# Patient Record
Sex: Female | Born: 1935
Health system: Southern US, Community
[De-identification: ages and names within clinical notes are randomized; demographics above are authoritative.]

## PROBLEM LIST (undated history)

## (undated) DIAGNOSIS — F329 Major depressive disorder, single episode, unspecified: Secondary | ICD-10-CM

## (undated) DIAGNOSIS — F419 Anxiety disorder, unspecified: Secondary | ICD-10-CM

## (undated) DIAGNOSIS — K219 Gastro-esophageal reflux disease without esophagitis: Secondary | ICD-10-CM

## (undated) DIAGNOSIS — J309 Allergic rhinitis, unspecified: Secondary | ICD-10-CM

## (undated) DIAGNOSIS — R7303 Prediabetes: Secondary | ICD-10-CM

## (undated) DIAGNOSIS — Z972 Presence of dental prosthetic device (complete) (partial): Secondary | ICD-10-CM

## (undated) DIAGNOSIS — E785 Hyperlipidemia, unspecified: Secondary | ICD-10-CM

## (undated) DIAGNOSIS — I4719 Other supraventricular tachycardia: Secondary | ICD-10-CM

## (undated) DIAGNOSIS — K08109 Complete loss of teeth, unspecified cause, unspecified class: Secondary | ICD-10-CM

## (undated) DIAGNOSIS — Z862 Personal history of diseases of the blood and blood-forming organs and certain disorders involving the immune mechanism: Secondary | ICD-10-CM

## (undated) DIAGNOSIS — I471 Supraventricular tachycardia: Secondary | ICD-10-CM

## (undated) DIAGNOSIS — C679 Malignant neoplasm of bladder, unspecified: Secondary | ICD-10-CM

## (undated) DIAGNOSIS — M1990 Unspecified osteoarthritis, unspecified site: Secondary | ICD-10-CM

## (undated) DIAGNOSIS — I2510 Atherosclerotic heart disease of native coronary artery without angina pectoris: Secondary | ICD-10-CM

## (undated) DIAGNOSIS — I10 Essential (primary) hypertension: Secondary | ICD-10-CM

## (undated) DIAGNOSIS — F32A Depression, unspecified: Secondary | ICD-10-CM

## (undated) DIAGNOSIS — Z973 Presence of spectacles and contact lenses: Secondary | ICD-10-CM

## (undated) HISTORY — DX: Depression, unspecified: F32.A

## (undated) HISTORY — DX: Essential (primary) hypertension: I10

## (undated) HISTORY — DX: Major depressive disorder, single episode, unspecified: F32.9

## (undated) HISTORY — DX: Anxiety disorder, unspecified: F41.9

## (undated) HISTORY — DX: Atherosclerotic heart disease of native coronary artery without angina pectoris: I25.10

## (undated) HISTORY — DX: Hyperlipidemia, unspecified: E78.5

## (undated) HISTORY — PX: CARDIOVASCULAR STRESS TEST: SHX262

## (undated) HISTORY — PX: ABDOMINAL HYSTERECTOMY: SHX81

## (undated) HISTORY — PX: CARDIAC CATHETERIZATION: SHX172

## (undated) HISTORY — DX: Allergic rhinitis, unspecified: J30.9

---

## 1968-12-26 HISTORY — PX: ABDOMINAL HYSTERECTOMY: SHX81

## 2005-02-04 ENCOUNTER — Other Ambulatory Visit: Admission: RE | Admit: 2005-02-04 | Discharge: 2005-02-17 | Payer: Self-pay | Admitting: Obstetrics and Gynecology

## 2005-03-10 ENCOUNTER — Ambulatory Visit: Payer: Self-pay | Admitting: Internal Medicine

## 2006-05-20 ENCOUNTER — Ambulatory Visit: Payer: Self-pay | Admitting: Internal Medicine

## 2007-05-27 ENCOUNTER — Ambulatory Visit: Payer: Self-pay | Admitting: Internal Medicine

## 2007-05-27 DIAGNOSIS — F411 Generalized anxiety disorder: Secondary | ICD-10-CM | POA: Insufficient documentation

## 2007-05-27 DIAGNOSIS — R5383 Other fatigue: Secondary | ICD-10-CM

## 2007-05-27 DIAGNOSIS — F32A Depression, unspecified: Secondary | ICD-10-CM | POA: Insufficient documentation

## 2007-05-27 DIAGNOSIS — F329 Major depressive disorder, single episode, unspecified: Secondary | ICD-10-CM

## 2007-05-27 DIAGNOSIS — R5381 Other malaise: Secondary | ICD-10-CM | POA: Insufficient documentation

## 2007-05-27 DIAGNOSIS — I10 Essential (primary) hypertension: Secondary | ICD-10-CM | POA: Insufficient documentation

## 2007-05-27 DIAGNOSIS — E785 Hyperlipidemia, unspecified: Secondary | ICD-10-CM | POA: Insufficient documentation

## 2007-05-30 LAB — CONVERTED CEMR LAB
ALT: 15 U/L
AST: 17 U/L
Albumin: 4 g/dL
Alkaline Phosphatase: 74 U/L
BUN: 19 mg/dL
Basophils Absolute: 0 10*3/uL
Basophils Relative: 0.6 %
Bilirubin, Direct: 0.1 mg/dL
CO2: 29 meq/L
Calcium: 9.9 mg/dL
Chloride: 105 meq/L
Cholesterol: 239 mg/dL
Creatinine, Ser: 1.1 mg/dL
Direct LDL: 147.3 mg/dL
Eosinophils Absolute: 0.1 10*3/uL
Eosinophils Relative: 1.5 %
GFR calc Af Amer: 63 mL/min
GFR calc non Af Amer: 52 mL/min
Glucose, Bld: 96 mg/dL
HCT: 34.3 % — ABNORMAL LOW
HDL: 64.6 mg/dL
Hemoglobin: 11.2 g/dL — ABNORMAL LOW
Lymphocytes Relative: 26 %
MCHC: 32.6 g/dL
MCV: 91.6 fL
Monocytes Absolute: 0.6 10*3/uL
Monocytes Relative: 9.8 %
Neutro Abs: 4.2 10*3/uL
Neutrophils Relative %: 62.1 %
Platelets: 227 10*3/uL
Potassium: 4.9 meq/L
RBC: 3.75 M/uL — ABNORMAL LOW
RDW: 12.2 %
Sodium: 140 meq/L
TSH: 2.17 u[IU]/mL
Total Bilirubin: 0.6 mg/dL
Total CHOL/HDL Ratio: 3.7
Total Protein: 6.9 g/dL
Triglycerides: 98 mg/dL
VLDL: 20 mg/dL
WBC: 6.6 10*3/uL

## 2008-01-16 ENCOUNTER — Telehealth (INDEPENDENT_AMBULATORY_CARE_PROVIDER_SITE_OTHER): Payer: Self-pay | Admitting: *Deleted

## 2008-06-19 ENCOUNTER — Ambulatory Visit: Payer: Self-pay | Admitting: Internal Medicine

## 2008-06-19 DIAGNOSIS — D509 Iron deficiency anemia, unspecified: Secondary | ICD-10-CM | POA: Insufficient documentation

## 2008-06-20 LAB — CONVERTED CEMR LAB
AST: 16 units/L (ref 0–37)
Albumin: 3.7 g/dL (ref 3.5–5.2)
BUN: 22 mg/dL (ref 6–23)
Basophils Absolute: 0.1 10*3/uL (ref 0.0–0.1)
Calcium: 9.7 mg/dL (ref 8.4–10.5)
Cholesterol: 280 mg/dL (ref 0–200)
Creatinine, Ser: 0.9 mg/dL (ref 0.4–1.2)
Direct LDL: 180.6 mg/dL
Eosinophils Absolute: 0.1 10*3/uL (ref 0.0–0.7)
GFR calc Af Amer: 79 mL/min
GFR calc non Af Amer: 65 mL/min
HCT: 36 % (ref 36.0–46.0)
Hemoglobin: 12.3 g/dL (ref 12.0–15.0)
Iron: 80 ug/dL (ref 42–145)
MCHC: 34.2 g/dL (ref 30.0–36.0)
Monocytes Absolute: 0.6 10*3/uL (ref 0.1–1.0)
Neutro Abs: 4.1 10*3/uL (ref 1.4–7.7)
RDW: 12.1 % (ref 11.5–14.6)
Saturation Ratios: 24.8 % (ref 20.0–50.0)
Triglycerides: 179 mg/dL — ABNORMAL HIGH (ref 0–149)
VLDL: 36 mg/dL (ref 0–40)
Vitamin B-12: 1119 pg/mL — ABNORMAL HIGH (ref 211–911)

## 2008-08-15 ENCOUNTER — Encounter: Payer: Self-pay | Admitting: Cardiovascular Disease

## 2008-08-16 ENCOUNTER — Ambulatory Visit: Payer: Self-pay | Admitting: Vascular Surgery

## 2008-08-22 ENCOUNTER — Inpatient Hospital Stay (HOSPITAL_BASED_OUTPATIENT_CLINIC_OR_DEPARTMENT_OTHER): Admission: RE | Admit: 2008-08-22 | Discharge: 2008-08-22 | Payer: Self-pay | Admitting: Cardiovascular Disease

## 2008-09-10 ENCOUNTER — Encounter: Payer: Self-pay | Admitting: Internal Medicine

## 2008-10-24 ENCOUNTER — Ambulatory Visit: Payer: Self-pay | Admitting: Internal Medicine

## 2008-10-24 DIAGNOSIS — I2510 Atherosclerotic heart disease of native coronary artery without angina pectoris: Secondary | ICD-10-CM | POA: Insufficient documentation

## 2008-10-24 DIAGNOSIS — R42 Dizziness and giddiness: Secondary | ICD-10-CM | POA: Insufficient documentation

## 2008-10-24 LAB — CONVERTED CEMR LAB
AST: 18 units/L (ref 0–37)
BUN: 19 mg/dL (ref 6–23)
Basophils Relative: 0.4 % (ref 0.0–3.0)
Cholesterol: 147 mg/dL (ref 0–200)
Eosinophils Relative: 1.3 % (ref 0.0–5.0)
GFR calc non Af Amer: 51.59 mL/min (ref >60)
HCT: 34.5 % — ABNORMAL LOW (ref 36.0–46.0)
Iron: 74 ug/dL (ref 42–145)
LDL Cholesterol: 84 mg/dL (ref 0–99)
Lymphs Abs: 1.5 10*3/uL (ref 0.7–4.0)
MCV: 90.1 fL (ref 78.0–100.0)
Monocytes Absolute: 0.4 10*3/uL (ref 0.1–1.0)
Neutro Abs: 3.4 10*3/uL (ref 1.4–7.7)
Platelets: 243 10*3/uL (ref 150.0–400.0)
Potassium: 4.2 meq/L (ref 3.5–5.1)
RBC: 3.83 M/uL — ABNORMAL LOW (ref 3.87–5.11)
Total Bilirubin: 0.7 mg/dL (ref 0.3–1.2)
Transferrin: 199.4 mg/dL — ABNORMAL LOW (ref 212.0–360.0)
VLDL: 14.4 mg/dL (ref 0.0–40.0)
WBC: 5.4 10*3/uL (ref 4.5–10.5)

## 2008-11-05 ENCOUNTER — Telehealth: Payer: Self-pay | Admitting: Internal Medicine

## 2008-11-23 ENCOUNTER — Ambulatory Visit: Payer: Self-pay | Admitting: Internal Medicine

## 2008-11-23 DIAGNOSIS — I951 Orthostatic hypotension: Secondary | ICD-10-CM | POA: Insufficient documentation

## 2008-11-23 LAB — CONVERTED CEMR LAB
GFR calc non Af Amer: 65.02 mL/min (ref >60)
Potassium: 3.1 meq/L — ABNORMAL LOW (ref 3.5–5.1)
Sodium: 143 meq/L (ref 135–145)

## 2008-12-21 ENCOUNTER — Telehealth: Payer: Self-pay | Admitting: Internal Medicine

## 2009-02-25 ENCOUNTER — Telehealth: Payer: Self-pay | Admitting: Internal Medicine

## 2009-02-26 ENCOUNTER — Telehealth (INDEPENDENT_AMBULATORY_CARE_PROVIDER_SITE_OTHER): Payer: Self-pay | Admitting: *Deleted

## 2009-02-27 ENCOUNTER — Encounter: Payer: Self-pay | Admitting: Internal Medicine

## 2009-06-10 ENCOUNTER — Ambulatory Visit: Payer: Self-pay | Admitting: Internal Medicine

## 2009-06-10 LAB — CONVERTED CEMR LAB
ALT: 22 units/L (ref 0–35)
AST: 21 units/L (ref 0–37)
Albumin: 3.8 g/dL (ref 3.5–5.2)
Alkaline Phosphatase: 86 units/L (ref 39–117)
BUN: 15 mg/dL (ref 6–23)
Basophils Absolute: 0 10*3/uL (ref 0.0–0.1)
Basophils Relative: 0.9 % (ref 0.0–3.0)
Bilirubin Urine: NEGATIVE
Bilirubin, Direct: 0.1 mg/dL (ref 0.0–0.3)
CO2: 30 meq/L (ref 19–32)
Calcium: 9.1 mg/dL (ref 8.4–10.5)
Chloride: 111 meq/L (ref 96–112)
Cholesterol: 162 mg/dL (ref 0–200)
Cortisol, Plasma: 10 ug/dL
Creatinine, Ser: 0.9 mg/dL (ref 0.4–1.2)
Eosinophils Absolute: 0 10*3/uL (ref 0.0–0.7)
Eosinophils Relative: 1.1 % (ref 0.0–5.0)
Folate: 8.4 ng/mL
GFR calc non Af Amer: 64.92 mL/min (ref >60)
Glucose, Bld: 105 mg/dL — ABNORMAL HIGH (ref 70–99)
HCT: 35.8 % — ABNORMAL LOW (ref 36.0–46.0)
HDL: 71 mg/dL (ref >39.00)
Hemoglobin: 11.8 g/dL — ABNORMAL LOW (ref 12.0–15.0)
Ketones, ur: NEGATIVE mg/dL
LDL Cholesterol: 79 mg/dL (ref 0–99)
Lymphocytes Relative: 37.1 % (ref 12.0–46.0)
Lymphs Abs: 1.7 10*3/uL (ref 0.7–4.0)
MCHC: 32.8 g/dL (ref 30.0–36.0)
MCV: 90.5 fL (ref 78.0–100.0)
Monocytes Absolute: 0.3 10*3/uL (ref 0.1–1.0)
Monocytes Relative: 7.6 % (ref 3.0–12.0)
Neutro Abs: 2.5 10*3/uL (ref 1.4–7.7)
Neutrophils Relative %: 53.3 % (ref 43.0–77.0)
Nitrite: NEGATIVE
Platelets: 186 10*3/uL (ref 150.0–400.0)
Potassium: 4.6 meq/L (ref 3.5–5.1)
RBC: 3.96 M/uL (ref 3.87–5.11)
RDW: 12.4 % (ref 11.5–14.6)
Saturation Ratios: 26 % (ref 20.0–50.0)
Sodium: 144 meq/L (ref 135–145)
Specific Gravity, Urine: >=1.03 (ref 1.000–1.030)
TSH: 1.4 microintl units/mL (ref 0.35–5.50)
Total Bilirubin: 0.3 mg/dL (ref 0.3–1.2)
Total CHOL/HDL Ratio: 2
Total Protein, Urine: NEGATIVE mg/dL
Total Protein: 6.7 g/dL (ref 6.0–8.3)
Transferrin: 236.1 mg/dL (ref 212.0–360.0)
Triglycerides: 62 mg/dL (ref 0.0–149.0)
Urine Glucose: NEGATIVE mg/dL
Urobilinogen, UA: 0.2 (ref 0.0–1.0)
VLDL: 12.4 mg/dL (ref 0.0–40.0)
Vitamin B-12: 524 pg/mL (ref 211–911)
WBC: 4.5 10*3/uL (ref 4.5–10.5)
pH: 5 (ref 5.0–8.0)

## 2009-06-13 ENCOUNTER — Ambulatory Visit: Payer: Self-pay | Admitting: Internal Medicine

## 2009-09-27 ENCOUNTER — Telehealth (INDEPENDENT_AMBULATORY_CARE_PROVIDER_SITE_OTHER): Payer: Self-pay | Admitting: *Deleted

## 2010-05-27 NOTE — Progress Notes (Signed)
----   Converted from flag ---- ---- 09/27/2009 3:03 PM, Corwin Levins MD wrote: ok to change the micardis to losartan 100 mg - to robin to handle  ---- 09/27/2009 2:36 PM, [name] wrote: called pt to inform of above information. Pt would like to change to Cozaar IF you think it will work for her.  ---- 09/27/2009 2:22 PM, Corwin Levins MD wrote: we only take requests from the patient regarding med change, thanks  ---- 09/27/2009 1:18 PM, [name] wrote:  ok  ---- 09/27/2009 12:06 PM, Rhonda Norman wrote: Rhonda Norman Rhonda Norman -pharmacist called.   Rhonda Norman/Aug 22, [id number] cannot afford Mycardis ($145), would like to switch to generic for Cozaar which is Losartan, is this okay? Pls let [name] @[facility]  know, thx.  [name] ------------------------------

## 2010-05-27 NOTE — Assessment & Plan Note (Signed)
Summary: 6 MO ROV /NWS #   Vital Signs:  Patient profile:   77 year old female Height:      67 inches Weight:      149.75 pounds BMI:     23.54 O2 Sat:      97 % on Room air Temp:     98.1 degrees F oral Pulse rate:   61 / minute BP sitting:   112 / 58  (left arm) Cuff size:   regular  Vitals Entered ByZella Ball Ewing (June 13, 2009 1:40 PM)  O2 Flow:  Room air  Preventive Care Screening     dec;lines immunizations and colonscopy  CC: 6 Mo ROV/RE   CC:  6 Mo ROV/RE.  History of Present Illness: doing well without the fludrocort for over 2 months without further dizziness;  Pt denies CP, sob, doe, wheezing, orthopnea, pnd, worsening LE edema, palps, dizziness or syncope  Pt denies new neuro symptoms such as headache, facial or extremity weakness   Here for wellness Diet: Heart Healthy or DM if diabetic Physical Activities: Sedentary, better in the summer - walks in the summer Depression/mood screen: Negative Hearing: Intact bilateral Visual Acuity: Grossly normal ADL's: Capable, lives by herself, just got a new puppy  Fall Risk: None Home Safety: Good End-of-Life Planning: Advance directive - Full code/I agree; has living will  Problems Prior to Update: 1)  Orthostatic Hypotension  (ICD-458.0) 2)  Dizziness  (ICD-780.4) 3)  Coronary Artery Disease  (ICD-414.00) 4)  Anemia-iron Deficiency  (ICD-280.9) 5)  Hyperlipidemia  (ICD-272.4) 6)  Fatigue  (ICD-780.79) 7)  Family History of Alcoholism/addiction  (ICD-V61.41) 8)  Depression  (ICD-311) 9)  Anxiety  (ICD-300.00) 10)  Hypertension  (ICD-401.9)  Medications Prior to Update: 1)  Micardis 80 Mg Tabs (Telmisartan) .Marland Kitchen.. 1po Once Daily 2)  Ecotrin Low Strength 81 Mg  Tbec (Aspirin) .Marland Kitchen.. 1 By Mouth Qd 3)  Fluoxetine Hcl 20 Mg Tabs (Fluoxetine Hcl) .Marland Kitchen.. 1 By Mouth Once Daily 4)  Simvastatin 80 Mg Tabs (Simvastatin) .Marland Kitchen.. 1 By Mouth Once Daily 5)  Fludrocortisone Acetate 0.1 Mg Tabs (Fludrocortisone Acetate) .Marland Kitchen.. 1  By Mouth Once Daily 6)  Omeprazole 20 Mg Cpdr (Omeprazole) .Marland Kitchen.. 1po Once Daily 7)  Amlodipine Besylate 5 Mg Tabs (Amlodipine Besylate) .Marland Kitchen.. 1 By Mouth Once Daily  Current Medications (verified): 1)  Micardis 80 Mg Tabs (Telmisartan) .Marland Kitchen.. 1po Once Daily 2)  Ecotrin Low Strength 81 Mg  Tbec (Aspirin) .Marland Kitchen.. 1 By Mouth Qd 3)  Fluoxetine Hcl 20 Mg Tabs (Fluoxetine Hcl) .Marland Kitchen.. 1 By Mouth Once Daily 4)  Simvastatin 80 Mg Tabs (Simvastatin) .Marland Kitchen.. 1 By Mouth Once Daily 5)  Omeprazole 20 Mg Cpdr (Omeprazole) .Marland Kitchen.. 1po Once Daily 6)  Amlodipine Besylate 5 Mg Tabs (Amlodipine Besylate) .Marland Kitchen.. 1 By Mouth Once Daily  Allergies (verified): 1)  ! Pcn 2)  ! 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         all otherwise negative per pt -  Physical Exam  General:  alert and well-developed.   Head:  normocephalic and atraumatic.   Eyes:  vision grossly intact, pupils equal, and pupils round.   Ears:  R ear normal and  L ear normal.   Nose:  no external deformity and no nasal discharge.   Mouth:  no gingival abnormalities and pharynx pink and moist.   Neck:  supple and no masses.   Lungs:  normal respiratory effort and normal breath sounds.   Heart:  normal rate and regular rhythm.   Abdomen:  soft, non-tender, and normal bowel sounds.   Msk:  no joint tenderness and no joint swelling.   Extremities:  no edema, no erythema  Neurologic:  cranial nerves II-XII intact, strength normal in all extremities, and DTRs symmetrical and normal.   Psych:  not anxious appearing and not depressed appearing.     Impression & Recommendations:  Problem # 1:  Preventive Health Care (ICD-V70.0)  Overall doing well, age appropriate education and counseling updated and referral for appropriate preventive services done unless declined, immunizations up to date or declined, diet counseling done if overweight, urged to quit smoking if smokes , most recent labs reviewed and current ordered if appropriate, ecg reviewed or declined (interpretation per ECG scanned in the EMR if done); information regarding Medicare Prevention requirements given if appropriate   Orders: First annual wellness visit with prevention plan  (N5621)  Problem # 2:  DIZZINESS (ICD-780.4) resolved, ok to cont off the fludrocortisone  Problem # 3:  HYPERTENSION (ICD-401.9)  Her updated medication list for this problem includes:    Micardis 80 Mg Tabs (Telmisartan) .Marland Kitchen... 1po once daily    Amlodipine Besylate 5 Mg Tabs (Amlodipine besylate) .Marland Kitchen... 1 by mouth once daily  BP today: 112/58 Prior BP: 132/78 (11/23/2008)  Labs Reviewed: K+: 4.6 (06/10/2009) Creat: : 0.9 (06/10/2009)   Chol: 162 (06/10/2009)   HDL: 71.00 (06/10/2009)   LDL: 79 (06/10/2009)   TG: 62.0 (06/10/2009) stable overall by hx and exam, ok to continue meds/tx as is   Problem # 4:  HYPERLIPIDEMIA (ICD-272.4)  Her updated medication list for this problem includes:    Simvastatin 80  Mg Tabs (Simvastatin) .Marland Kitchen... 1 by mouth once daily  Labs Reviewed: SGOT: 21 (06/10/2009)   SGPT: 22 (06/10/2009)   HDL:71.00 (06/10/2009), 48.70 (10/24/2008)  LDL:79 (06/10/2009), 84 (10/24/2008)  Chol:162 (06/10/2009), 147 (10/24/2008)  Trig:62.0 (06/10/2009), 72.0 (10/24/2008) stable overall by hx and exam, ok to continue meds/tx as is, Pt to continue diet efforts, good med tolerance;  - goal LDL less than 100  Complete Medication List: 1)  Micardis 80 Mg Tabs (Telmisartan) .Marland Kitchen.. 1po once daily 2)  Ecotrin Low Strength 81 Mg Tbec (Aspirin) .Marland Kitchen.. 1 by mouth qd 3)  Fluoxetine Hcl 20 Mg Tabs (Fluoxetine hcl) .Marland Kitchen.. 1 by mouth once daily 4)  Simvastatin 80 Mg Tabs (Simvastatin) .Marland Kitchen.. 1 by mouth once daily 5)  Omeprazole 20 Mg Cpdr (Omeprazole) .Marland Kitchen.. 1po once daily 6)  Amlodipine Besylate 5 Mg Tabs (Amlodipine besylate) .Marland Kitchen.. 1 by mouth once daily  Patient Instructions: 1)  Continue all previous medications as before this visit  2)  Please schedule a follow-up appointment in 1 year or sooner if needed Prescriptions: AMLODIPINE BESYLATE 5 MG TABS (AMLODIPINE BESYLATE) 1  by mouth once daily  #90 x 3   Entered and Authorized by:   Corwin Levins MD   Signed by:   Corwin Levins MD on 06/13/2009   Method used:   Print then Give to Patient   RxID:   [id number] OMEPRAZOLE 20 MG CPDR (OMEPRAZOLE) 1po once daily  #90 x 3   Entered and Authorized by:   Corwin Levins MD   Signed by:   Corwin Levins MD on 06/13/2009   Method used:   Print then Give to Patient   RxID:   [id number] SIMVASTATIN 80 MG TABS (SIMVASTATIN) 1 by mouth once daily  #90 x 3   Entered and Authorized by:   Corwin Levins MD   Signed by:   Corwin Levins MD on 06/13/2009   Method used:   Print then Give to Patient   RxID:   [id number] FLUOXETINE HCL 20 MG TABS (FLUOXETINE HCL) 1 by mouth once daily  #90 x 3   Entered and Authorized by:   Corwin Levins MD   Signed by:   Corwin Levins MD on 06/13/2009   Method used:   Print  then Give to Patient   RxID:   [id number] MICARDIS 80 MG TABS (TELMISARTAN) 1po once daily  #90 x 3   Entered and Authorized by:   Corwin Levins MD   Signed by:   Corwin Levins MD on 06/13/2009   Method used:   Print then Give to Patient   RxID:   [id number]

## 2010-06-30 ENCOUNTER — Other Ambulatory Visit: Payer: Medicare Other

## 2010-06-30 ENCOUNTER — Other Ambulatory Visit: Payer: Self-pay | Admitting: Internal Medicine

## 2010-06-30 ENCOUNTER — Ambulatory Visit (INDEPENDENT_AMBULATORY_CARE_PROVIDER_SITE_OTHER): Payer: Medicare Other | Admitting: Internal Medicine

## 2010-06-30 ENCOUNTER — Encounter: Payer: Self-pay | Admitting: Internal Medicine

## 2010-06-30 DIAGNOSIS — I10 Essential (primary) hypertension: Secondary | ICD-10-CM

## 2010-06-30 DIAGNOSIS — R5381 Other malaise: Secondary | ICD-10-CM

## 2010-06-30 DIAGNOSIS — F329 Major depressive disorder, single episode, unspecified: Secondary | ICD-10-CM

## 2010-06-30 DIAGNOSIS — E785 Hyperlipidemia, unspecified: Secondary | ICD-10-CM

## 2010-06-30 DIAGNOSIS — K219 Gastro-esophageal reflux disease without esophagitis: Secondary | ICD-10-CM | POA: Insufficient documentation

## 2010-06-30 DIAGNOSIS — R5383 Other fatigue: Secondary | ICD-10-CM

## 2010-06-30 DIAGNOSIS — I2510 Atherosclerotic heart disease of native coronary artery without angina pectoris: Secondary | ICD-10-CM

## 2010-06-30 LAB — BASIC METABOLIC PANEL
Calcium: 9.4 mg/dL (ref 8.4–10.5)
GFR: 60.09 mL/min (ref >60.00)
Glucose, Bld: 103 mg/dL — ABNORMAL HIGH (ref 70–99)
Potassium: 4.7 mEq/L (ref 3.5–5.1)
Sodium: 138 mEq/L (ref 135–145)

## 2010-06-30 LAB — CBC WITH DIFFERENTIAL/PLATELET
Basophils Absolute: 0.1 10*3/uL (ref 0.0–0.1)
Eosinophils Relative: 2.1 % (ref 0.0–5.0)
HCT: 36.9 % (ref 36.0–46.0)
Hemoglobin: 12.5 g/dL (ref 12.0–15.0)
Lymphocytes Relative: 32.7 % (ref 12.0–46.0)
Lymphs Abs: 1.7 10*3/uL (ref 0.7–4.0)
Monocytes Relative: 7.8 % (ref 3.0–12.0)
Neutro Abs: 2.9 10*3/uL (ref 1.4–7.7)
Platelets: 184 10*3/uL (ref 150.0–400.0)
RDW: 13.5 % (ref 11.5–14.6)
WBC: 5.1 10*3/uL (ref 4.5–10.5)

## 2010-06-30 LAB — HEPATIC FUNCTION PANEL
ALT: 21 U/L (ref 0–35)
AST: 22 U/L (ref 0–37)
Alkaline Phosphatase: 79 U/L (ref 39–117)
Total Bilirubin: 0.5 mg/dL (ref 0.3–1.2)

## 2010-06-30 LAB — LIPID PANEL
Cholesterol: 156 mg/dL (ref 0–200)
HDL: 62.9 mg/dL (ref >39.00)
LDL Cholesterol: 77 mg/dL (ref 0–99)
Total CHOL/HDL Ratio: 2
Triglycerides: 79 mg/dL (ref 0.0–149.0)
VLDL: 15.8 mg/dL (ref 0.0–40.0)

## 2010-07-08 ENCOUNTER — Telehealth (INDEPENDENT_AMBULATORY_CARE_PROVIDER_SITE_OTHER): Payer: Self-pay | Admitting: *Deleted

## 2010-07-08 NOTE — Assessment & Plan Note (Signed)
Summary: fu/lb   Vital Signs:  Patient profile:   77 year old female Height:      66 inches Weight:      175 pounds BMI:     28.35 O2 Sat:      99 % on Room air Temp:     98.1 degrees F oral Pulse rate:   68 / minute BP sitting:   140 / 86  (left arm) Cuff size:   regular  Vitals Entered By: [name] CMA (AAMA) (June 30, 2010 8:03 AM)  O2 Flow:  Room air  Preventive Care Screening     decliens colonoscopy, tetansu and pneumonia shot  CC: Followup/RE   CC:  Followup/RE.  History of Present Illness: here to f/u - overall doing ok, Pt denies CP, worsening sob, doe, wheezing, orthopnea, pnd, worsening LE edema, palps, dizziness or syncope  Pt denies new neuro symptoms such as headache, facial or extremity weakness  Pt denies polydipsia, polyuria .  Overall good compliance with meds, trying to follow low chol diet, wt stable, little excercise however . and good med tolerability.  No fever, wt loss, night sweats, loss of appetite or other constitutional symptoms .  Not taking the prozac - doing ok without for now - Denies worsening depressive symptoms, suicidal ideation, or panic.  Does have significant fatigue, without OSA symptoms.   Problems Prior to Update: 1)  Gerd  (ICD-530.81) 2)  Preventive Health Care  (ICD-V70.0) 3)  Orthostatic Hypotension  (ICD-458.0) 4)  Dizziness  (ICD-780.4) 5)  Coronary Artery Disease  (ICD-414.00) 6)  Anemia-iron Deficiency  (ICD-280.9) 7)  Hyperlipidemia  (ICD-272.4) 8)  Fatigue  (ICD-780.79) 9)  Family History of Alcoholism/addiction  (ICD-V61.41) 10)  Depression  (ICD-311) 11)  Anxiety  (ICD-300.00) 12)  Hypertension  (ICD-401.9)  Medications Prior to Update: 1)  Micardis 80 Mg Tabs (Telmisartan) .Marland Kitchen.. 1po Once Daily 2)  Ecotrin Low Strength 81 Mg  Tbec (Aspirin) .Marland Kitchen.. 1 By Mouth Qd 3)  Fluoxetine Hcl 20 Mg Tabs (Fluoxetine Hcl) .Marland Kitchen.. 1 By Mouth Once Daily 4)  Simvastatin 80 Mg Tabs (Simvastatin) .Marland Kitchen.. 1 By Mouth Once Daily 5)   Omeprazole 20 Mg Cpdr (Omeprazole) .Marland Kitchen.. 1po Once Daily 6)  Amlodipine Besylate 5 Mg Tabs (Amlodipine Besylate) .Marland Kitchen.. 1 By Mouth Once Daily 7)  Losartan Potassium 100 Mg Tabs (Losartan Potassium) .Marland Kitchen.. 1 By Mouth Once Daily  Current Medications (verified): 1)  Ecotrin Low Strength 81 Mg  Tbec (Aspirin) .Marland Kitchen.. 1 By Mouth Qd 2)  Atorvastatin Calcium 40 Mg Tabs (Atorvastatin Calcium) .Marland Kitchen.. 1po Once Daily 3)  Pantoprazole Sodium 40 Mg Tbec (Pantoprazole Sodium) .Marland Kitchen.. 1po Once Daily 4)  Twynsta 80-5 Mg Tabs (Telmisartan-Amlodipine) .Marland Kitchen.. 1po Once Daily  Allergies (verified): 1)  ! Pcn 2)  ! 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   Head:  normocephalic and atraumatic.   Eyes:  vision grossly intact, pupils equal, and pupils round.   Ears:  R ear normal and L ear normal.   Nose:  no external deformity and no  nasal discharge.   Mouth:  no gingival abnormalities and pharynx pink and moist.   Neck:  supple and no masses.   Lungs:  normal respiratory effort and normal breath sounds.   Heart:  normal rate and regular rhythm.   Abdomen:  soft, non-tender, and normal bowel sounds.   Msk:  no joint tenderness and no joint swelling.   Extremities:  no edema, no erythema  Neurologic:  cranial nerves II-XII intact, strength normal in all extremities, and DTRs symmetrical and normal.   Skin:  color normal and no rashes.   Psych:  not depressed appearing and slightly anxious.     Impression &  Recommendations:  Problem # 1:  FATIGUE (ICD-780.79) exam benign, to check labs below; follow with expectant management  Orders: TLB-BMP (Basic Metabolic Panel-BMET) (80048-METABOL) TLB-CBC Platelet - w/Differential (85025-CBCD) TLB-Hepatic/Liver Function Pnl (80076-HEPATIC) TLB-TSH (Thyroid Stimulating Hormone) (84443-TSH)  Problem # 2:  HYPERLIPIDEMIA (ICD-272.4)  Her updated medication list for this problem includes:    Atorvastatin Calcium 40 Mg Tabs (Atorvastatin calcium) .Marland Kitchen... 1po once daily  Labs Reviewed: SGOT: 21 (06/10/2009)   SGPT: 22 (06/10/2009)   HDL:71.00 (06/10/2009), 48.70 (10/24/2008)  LDL:79 (06/10/2009), 84 (10/24/2008)  Chol:162 (06/10/2009), 147 (10/24/2008)  Trig:62.0 (06/10/2009), 72.0 (10/24/2008) stable overall by hx and exam, ok to continue meds/tx as is   Problem # 3:  HYPERTENSION (ICD-401.9)  The following medications were removed from the medication list:    Micardis 80 Mg Tabs (Telmisartan) .Marland Kitchen... 1po once daily    Amlodipine Besylate 5 Mg Tabs (Amlodipine besylate) .Marland Kitchen... 1 by mouth once daily    Losartan Potassium 100 Mg Tabs (Losartan potassium) .Marland Kitchen... 1 by mouth once daily Her updated medication list for this problem includes:    Twynsta 80-5 Mg Tabs (Telmisartan-amlodipine) .Marland Kitchen... 1po once daily  BP today: 140/86 Prior BP: 112/58 (06/13/2009)  Labs Reviewed: K+: 4.6 (06/10/2009) Creat: : 0.9 (06/10/2009)   Chol: 162 (06/10/2009)   HDL: 71.00 (06/10/2009)   LDL: 79 (06/10/2009)   TG: 62.0 (06/10/2009) stable overall by hx and exam, ok to continue meds/tx as is   Problem # 4:  DEPRESSION (ICD-311)  The following medications were removed from the medication list:    Fluoxetine Hcl 20 Mg Tabs (Fluoxetine hcl) .Marland Kitchen... 1 by mouth once daily stable overall by hx and exam, ok to continue meds/tx as is   Discussed treatment options, including trial of antidpressant medication. Patient agrees to call if any worsening of symptoms or thoughts of doing  harm arise. Verified that the patient has no suicidal ideation at this time.   Problem # 5:  GERD (ICD-530.81)  Her updated medication list for this problem includes:    Pantoprazole Sodium 40 Mg Tbec (Pantoprazole sodium) .Marland Kitchen... 1po once daily uncontrolled - ok to change to generic protonix  Complete Medication List: 1)  Ecotrin Low Strength 81 Mg Tbec (Aspirin) .Marland Kitchen.. 1 by mouth qd 2)  Atorvastatin Calcium 40 Mg Tabs (Atorvastatin calcium) .Marland Kitchen.. 1po once daily 3)  Pantoprazole Sodium 40 Mg Tbec (Pantoprazole sodium) .Marland Kitchen.. 1po once daily 4)  Twynsta 80-5 Mg Tabs (Telmisartan-amlodipine) .Marland Kitchen.. 1po once daily  Other Orders: TLB-Lipid Panel (80061-LIPID)  Patient Instructions: 1)  You should take  multivitamin  - 1 per day 2)  You should also take the equivalent of oscal plus D at 2 to 3 times per day with meals 3)  stop the prozac as you have done 4)  stop the micardis 5)  stop the amlodipine  6)  stop the omeprazole 7)  stop the simvastatin 8)  start the twynsta as prescribed 9)  start the generic protonix (pantoprazole) as prescribed 10)  start the generic lipitor (atorvastatin) as prescribed 11)  Please go to the Lab in the basement for your blood and/or urine tests today 12)  Please call the number on the [facility] Card for results of your testing  13)  Please schedule a follow-up appointment in 6 months. Prescriptions: ATORVASTATIN CALCIUM 40 MG TABS (ATORVASTATIN CALCIUM) 1po once daily  #90 x 3   Entered and Authorized by:   Corwin Levins MD   Signed by:   Corwin Levins MD on 06/30/2010   Method used:   Electronically to        [facility]  [phone]* (retail)       [address]       [place], [place]  [address]       Ph: [phone] or [phone]       Fax: [phone]   RxID:   [id number] PANTOPRAZOLE SODIUM 40 MG TBEC (PANTOPRAZOLE SODIUM) 1po once daily  #90 x 3   Entered and Authorized by:   Corwin Levins MD   Signed by:   Corwin Levins MD on 06/30/2010   Method used:   Electronically to        [facility]  [phone]* (retail)       [address]       [place], [place]  [address]       Ph: [phone] or [phone]       Fax: [phone]   RxID:   [id number] TWYNSTA 80-5 MG TABS (TELMISARTAN-AMLODIPINE) 1po once daily  #90 x 3   Entered and Authorized by:   Corwin Levins MD   Signed by:   Corwin Levins MD on 06/30/2010   Method used:   Electronically to        [facility]  [phone]* (retail)       [address]       [place], [place]  [address]       Ph: [phone] or [phone]       Fax: [phone]   RxID:   [id number] MICARDIS 80 MG TABS (TELMISARTAN) 1po once daily  #90 x 3   Entered and Authorized by:   Corwin Levins MD   Signed by:   Corwin Levins MD on 06/30/2010   Method used:   Electronically to        [facility]  [phone]* (retail)       [address]       [place], [place]  [address]       Ph: [phone] or [phone]       Fax: [phone]   RxID:   [id number]    Orders Added: 1)  TLB-BMP (Basic Metabolic Panel-BMET) [80048-METABOL] 2)  TLB-CBC Platelet - w/Differential [85025-CBCD] 3)  TLB-Hepatic/Liver Function Pnl [80076-HEPATIC] 4)  TLB-TSH (Thyroid Stimulating Hormone) [84443-TSH] 5)  TLB-Lipid Panel [80061-LIPID] 6)  Est. Patient Level IV [32[address]]

## 2010-07-24 NOTE — Progress Notes (Signed)
Summary: PA-Pantoprazole  Phone Note From Pharmacy   Reason for Call: Patient requests substitution Summary of Call: Recevied fax from pharmacy stating that Pantoprazole need PA. [employer] contacted via web and waiting on fax to be completed.Alvy Beal Archie CMA  July 08, 2010 2:45 PM  Form to Dr Jonny Ruiz to complete. Dagoberto Reef  July 10, 2010 4:03 PM Pa faxed to [facility] @ [phone], awaiting approval. Dagoberto Reef  July 11, 2010 4:46 PM PA approved 06/20/10 - 07/11/11, pt aware. Initial call taken by: Dagoberto Reef,  July 14, 2010 1:56 PM

## 2010-09-09 NOTE — Cardiovascular Report (Signed)
NAME:  [name], [name] NO.:  [id number]   MEDICAL RECORD NO.:  [id number]          PATIENT TYPE:  OIB   LOCATION:  1961                         FACILITY:  [facility]   PHYSICIAN:  Vesta Mixer, M.D. DATE OF BIRTH:  07-11-1932   DATE OF PROCEDURE:  08/22/2008  DATE OF DISCHARGE:                            CARDIAC CATHETERIZATION   Rhonda Norman is a 77 year old female with a history of chest pains.  She had an abnormal stress test.  She is referred for further  evaluation.   The procedure was left heart catheterization with coronary angiography.   The right femoral artery was easily cannulated using modified Seldinger  technique.   HEMODYNAMICS:  LV pressure is 140/17 with an aortic pressure of 138/68.   ANGIOGRAPHY:  The left main is fairly smooth.  There is mild-to-moderate  degree of calcification that extends from the left main down into the  LAD and circumflex artery.  Left anterior descending artery has minor  luminal irregularities.  It is a very tortuous vessel.  The diagonal  arteries are small to moderate in size and are otherwise unremarkable.  There are minor luminal irregularities throughout the LAD distribution.   The left circumflex artery is moderate-sized vessel.  There is a mild-to-  moderate degree of calcification in the proximal vessel.  The obtuse  marginal artery is a large and branching vessel.  It is fairly normal.  The distal circumflex has minor luminal irregularities.   The right coronary artery is moderate in size.  It is moderately  calcified all the way down.  It is dominant.  There are minor luminal  irregularities in the proximal mid vessel.  The distal RCA has a 30%  stenosis just prior to the bifurcation.   The posterior descending artery and the posterolateral segment artery  have minor luminal irregularities.   The left ventriculogram reveals normal left ventricular systolic  function.  Ejection fraction is around  60%.  There is no significant  mitral regurgitation.   COMPLICATIONS:  None.   CONCLUSIONS:  Mild-to-moderate coronary artery irregularities.  It is  possible that she had some coronary spasm to explain her symptoms,  although she does not have any significant atherosclerosis that requires  angioplasty.      Vesta Mixer, M.D.  Electronically Signed     PJN/MEDQ  D:  08/22/2008  T:  08/22/2008  Job:  [id number]   cc:   Corwin Levins, MD

## 2010-09-09 NOTE — H&P (Signed)
NAME:  AUDRIE, KURI NO.:  [id number]   MEDICAL RECORD NO.:  [id number]          PATIENT TYPE:  OUT   LOCATION:  CATH                         FACILITY:  [facility]   PHYSICIAN:  Vesta Mixer, M.D. DATE OF BIRTH:  11-14-1932   DATE OF ADMISSION:  08/22/2008  DATE OF DISCHARGE:                              HISTORY & PHYSICAL   HISTORY OF PRESENT ILLNESS:  Rhonda Norman is a middle-aged female  who is admitted for heart catheterization after presenting with episodes  of chest pain.  She has had an abnormal stress Cardiolite study as well.   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  She is self referred  here.  She has a long history of hypertension and hypercholesterolemia.  She has been trying to get out to walk, but has been having problems  with chest pains.  The pains start in her chest and radiate through to  her shoulder blades.  These started about 6 months ago.  She has tried  some muscle relaxers, but they did not seem to work.  She has also had  new onset indigestion for the past 1-2 months.  She has been taking some  over-the-counter acid relievers with only partial relief.  She has  several types of dizziness.  One type of her dizziness' can certainly  consistent with orthostasis.  She also has had an episode of presyncope  that is not related to orthostasis.  These episodes of chest pain lasts  for several minutes.  They typically go away when she stops walking.  They are not necessarily associated with eating, drinking, change of  position, or taking a deep breath.  She does describe shortness of  breath.  There is no nausea or vomiting.  She does describe indigestion  as noted above.  She has not had any heat or cold intolerance, weight  gain or weight loss.  There is no cough or sputum production.  There is  no numbness or tingling.  There is no blood or urine blood in her stool.  All other systems were reviewed and are negative.   CURRENT MEDICATIONS:  1. Micardis HCT 80/25 mg once a day.  2. Prozac 20 mg a day.  3. Simvastatin 80 mg a day.  4. Aspirin 81 mg a day.  5. Fish oil 3 times a day.  6. Multivitamin once a day.  7. Vitamin D and vitamin B once a day.   ALLERGIES:  She is allergic to PENICILLIN.   PAST MEDICAL HISTORY:  1. Hyperlipidemia.  2. Hypertension.   FAMILY HISTORY:  Her father died in his 79s due to tuberculosis.  Her  mother died at age 86.   SOCIAL HISTORY:  The patient does not smoke.  She drinks alcohol only  rarely.  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  She  tries to get out and walk occasionally.   REVIEW OF SYSTEMS:  As reviewed in the HPI.   PHYSICAL EXAMINATION:  GENERAL:  She is a middle-aged female, in no  acute distress.  She is alert and oriented x3.  Her mood and affect are  normal.  VITAL SIGNS:  Weight is 159, blood pressures are 120/64 with heart rate  of 84.  HEENT:  Pupils 2+ carotids.  She has no bruits, no JVD, or no  thyromegaly.  NECK:  Supple.  Her sclerae are nonicteric.  Her mucous membranes are  moist.  BACK:  Nontender.  LUNGS:  Clear.  HEART:  Regular rate, S1 and S2.  Her PMI is nondisplaced.  ABDOMEN:  Good bowel sounds.  She does have a pulsatile midline mass  consistent with an abdominal aorta.  There is no tenderness.  There is  no guarding or rebound.  EXTREMITIES:  She has no clubbing, cyanosis, or edema.  SKIN:  Intact.  There is no rashes.  Her pulses are normal.  There is no  rash or skin nodules.  NEUROLOGIC:  Cranial nerves II through XII are intact, and motor and  sensory function are intact.  Her gait is normal.   Her EKG reveals normal sinus rhythm.  She has no ST or T-wave changes.   Her stress Cardiolite study revealed significant blood pressure drop  following exercise.  The sonographic images revealed no evidence of  ischemia.   Ms. Boltz presents with episodes of chest pain and an abnormal  Cardiolite  study.  Her heart rate increased and her blood pressure  dropped at peak exercise.  This is certainly a concerning feature.  I am  concerned that this lack of ischemia on the sonographic images may in  fact be balanced disease.  We have discussed the risks, benefits, and  options of heart catheterization.  She understands and agrees to  proceed.      Vesta Mixer, M.D.  Electronically Signed     PJN/MEDQ  D:  08/20/2008  T:  08/20/2008  Job:  [id number]   cc:   Corwin Levins, MD

## 2010-09-09 NOTE — Procedures (Signed)
DUPLEX ULTRASOUND OF ABDOMINAL AORTA   INDICATION:  Questionable abdominal aortic aneurysm due to palpable  abdomen.   HISTORY:  Diabetes:  No.  Cardiac:  No.  Hypertension:  Yes.  Smoking:  No.  Connective Tissue Disorder:  Family History:  Previous Surgery:   DUPLEX EXAM:         AP (cm)                   TRANSVERSE (cm)  Proximal             2.34 cm                   2.49 cm  Mid                  2.10 cm                   2.21 cm  Distal               1.61 cm                   1.71 cm  Right Iliac          0.92 cm                   0.97 cm  Left Iliac           0.94 cm                   1.05 cm   PREVIOUS:  Date:  AP:  TRANSVERSE:   IMPRESSION:  No evidence of abdominal aortic aneurysm noted.   ___________________________________________  Larina Earthly, M.D.   MG/MEDQ  D:  08/16/2008  T:  08/16/2008  Job:  [id number]

## 2010-10-21 ENCOUNTER — Other Ambulatory Visit: Payer: Self-pay | Admitting: Internal Medicine

## 2011-04-28 HISTORY — PX: CATARACT EXTRACTION W/ INTRAOCULAR LENS  IMPLANT, BILATERAL: SHX1307

## 2011-06-12 ENCOUNTER — Other Ambulatory Visit (INDEPENDENT_AMBULATORY_CARE_PROVIDER_SITE_OTHER): Payer: Medicare Other

## 2011-06-12 ENCOUNTER — Ambulatory Visit (INDEPENDENT_AMBULATORY_CARE_PROVIDER_SITE_OTHER): Payer: Medicare Other | Admitting: Internal Medicine

## 2011-06-12 ENCOUNTER — Encounter: Payer: Self-pay | Admitting: Internal Medicine

## 2011-06-12 VITALS — BP 146/78 | HR 75 | Temp 97.0°F | Resp 16 | Ht 66.0 in | Wt 179.5 lb

## 2011-06-12 DIAGNOSIS — E785 Hyperlipidemia, unspecified: Secondary | ICD-10-CM

## 2011-06-12 DIAGNOSIS — R7309 Other abnormal glucose: Secondary | ICD-10-CM

## 2011-06-12 DIAGNOSIS — Z0000 Encounter for general adult medical examination without abnormal findings: Secondary | ICD-10-CM

## 2011-06-12 DIAGNOSIS — R5381 Other malaise: Secondary | ICD-10-CM

## 2011-06-12 DIAGNOSIS — Z0001 Encounter for general adult medical examination with abnormal findings: Secondary | ICD-10-CM | POA: Insufficient documentation

## 2011-06-12 DIAGNOSIS — R5383 Other fatigue: Secondary | ICD-10-CM

## 2011-06-12 DIAGNOSIS — D509 Iron deficiency anemia, unspecified: Secondary | ICD-10-CM

## 2011-06-12 DIAGNOSIS — R7302 Impaired glucose tolerance (oral): Secondary | ICD-10-CM | POA: Insufficient documentation

## 2011-06-12 LAB — HEPATIC FUNCTION PANEL
ALT: 22 U/L (ref 0–35)
AST: 22 U/L (ref 0–37)
Albumin: 4.1 g/dL (ref 3.5–5.2)
Alkaline Phosphatase: 90 U/L (ref 39–117)
Bilirubin, Direct: 0.1 mg/dL (ref 0.0–0.3)
Total Bilirubin: 0.6 mg/dL (ref 0.3–1.2)
Total Protein: 7.3 g/dL (ref 6.0–8.3)

## 2011-06-12 LAB — BASIC METABOLIC PANEL
BUN: 20 mg/dL (ref 6–23)
GFR: 59.94 mL/min — ABNORMAL LOW (ref >60.00)
Potassium: 3.9 mEq/L (ref 3.5–5.1)

## 2011-06-12 LAB — URINALYSIS, ROUTINE W REFLEX MICROSCOPIC
Bilirubin Urine: NEGATIVE
Nitrite: NEGATIVE
Urobilinogen, UA: 0.2 (ref 0.0–1.0)

## 2011-06-12 LAB — TSH: TSH: 1.69 u[IU]/mL (ref 0.35–5.50)

## 2011-06-12 LAB — CBC WITH DIFFERENTIAL/PLATELET
Basophils Absolute: 0 10*3/uL (ref 0.0–0.1)
Basophils Relative: 0.9 % (ref 0.0–3.0)
Eosinophils Absolute: 0.1 10*3/uL (ref 0.0–0.7)
Lymphocytes Relative: 29.4 % (ref 12.0–46.0)
MCHC: 33.2 g/dL (ref 30.0–36.0)
MCV: 92.5 fl (ref 78.0–100.0)
Monocytes Absolute: 0.6 10*3/uL (ref 0.1–1.0)
Neutrophils Relative %: 58.1 % (ref 43.0–77.0)
Platelets: 207 10*3/uL (ref 150.0–400.0)
RBC: 4.32 Mil/uL (ref 3.87–5.11)
RDW: 13.1 % (ref 11.5–14.6)

## 2011-06-12 LAB — IBC PANEL
Iron: 89 ug/dL (ref 42–145)
Transferrin: 258.2 mg/dL (ref 212.0–360.0)

## 2011-06-12 LAB — LIPID PANEL
HDL: 73.6 mg/dL (ref >39.00)
LDL Cholesterol: 79 mg/dL (ref 0–99)
VLDL: 14.2 mg/dL (ref 0.0–40.0)

## 2011-06-12 MED ORDER — ATORVASTATIN CALCIUM 40 MG PO TABS: 40.0000 mg | ORAL_TABLET | Freq: Every day | ORAL | Status: DC

## 2011-06-12 MED ORDER — TELMISARTAN-AMLODIPINE 80-5 MG PO TABS: 1.0000 | ORAL_TABLET | Freq: Every day | ORAL | Status: DC

## 2011-06-12 MED ORDER — PNEUMOCOCCAL VAC POLYVALENT 25 MCG/0.5ML IJ INJ: 0.5000 mL | INJECTION | Freq: Once | INTRAMUSCULAR | Status: DC

## 2011-06-12 NOTE — Assessment & Plan Note (Signed)
Not charged today, but due for pneumovax, and screeningcolonosocpy - will order

## 2011-06-12 NOTE — Patient Instructions (Addendum)
OK to hold on taking the lipitor generic for 4 wks, but if fhte arm pain is not improved, ok to re-start the medication.  If improved, then call for change of medication at [phone]; remember to do this as you need to have your cholesterol down due to your history of coronary blockages Continue all other medications as before Please go to LAB in the Basement for the blood and/or urine tests to be done today Please call the phone number [phone] (the PhoneTree System) for results of testing in 2-3 days;  When calling, simply dial the number, and when prompted enter the MRN number above (the Medical Record Number) and the # key, then the message should start. You had the pneumonia shot today You will be contacted regarding the referral for: screening colonscopy Your refills were given hardcopy today Please return in 1 year for your yearly visit, or sooner if needed

## 2011-06-12 NOTE — Assessment & Plan Note (Signed)
Minor, but has gained wt - for a1c,  to f/u any worsening symptoms or concerns

## 2011-06-12 NOTE — Progress Notes (Signed)
Subjective:    Patient ID: Rhonda Norman, female    DOB: 1932/10/30, 79 y.o.   MRN: [id number]  HPI  Here for f/u;  Overall doing ok;  Pt denies CP, worsening SOB, DOE, wheezing, orthopnea, PND, worsening LE edema, palpitations, dizziness or syncope.  Pt denies neurological change such as new Headache, facial or extremity weakness.  Pt denies polydipsia, polyuria, or low sugar symptoms. Pt states overall good compliance with treatment and medications, good tolerability, and trying to follow lower cholesterol diet.  Pt denies worsening depressive symptoms, suicidal ideation or panic. No fever, wt loss, night sweats, loss of appetite, or other constitutional symptoms.  Pt states good ability with ADL's, low fall risk, home safety reviewed and adequate, no significant changes in hearing or vision, and occasionally active with exercise. Did seee optho yest and needs new glasses and cataracts not mature yet. Does mention some soreness to the arms/biceps/triceps.  Does have sense of ongoing fatigue, but denies signficant hypersomnolence. BP at home normall < 140/90. No overt bleeding or bruising Past Medical History  Diagnosis Date  . Impaired glucose tolerance 06/12/2011  . CAD (coronary artery disease)     non obstructive, Dr Elease Hashimoto, april 2010  . Anemia, iron deficiency   . Hyperlipidemia   . Anxiety   . Depression   . Hypertension    Past Surgical History  Procedure Date  . Abdominal hysterectomy     reports that she has never smoked. She does not have any smokeless tobacco history on file. She reports that she drinks alcohol. She reports that she does not use illicit drugs. family history includes Cancer in her mother and Hypertension in her father. Allergies  Allergen Reactions  . Codeine   . Penicillins    No current outpatient prescriptions on file prior to visit.   No current facility-administered medications on file prior to visit.   Review of Systems Review of Systems    Constitutional: Negative for diaphoresis, activity change, appetite change and unexpected weight change.  HENT: Negative for hearing loss, ear pain, facial swelling, mouth sores and neck stiffness.   Eyes: Negative for pain, redness and visual disturbance.  Respiratory: Negative for shortness of breath and wheezing.   Cardiovascular: Negative for chest pain and palpitations.  Gastrointestinal: Negative for diarrhea, blood in stool, abdominal distention and rectal pain.  Genitourinary: Negative for hematuria, flank pain and decreased urine volume.  Musculoskeletal: Negative for myalgias and joint swelling.  Skin: Negative for color change and wound.  Neurological: Negative for syncope and numbness.  Hematological: Negative for adenopathy.  Psychiatric/Behavioral: Negative for hallucinations, self-injury, decreased concentration and agitation.      Objective:   Physical Exam BP 146/78  Pulse 75  Temp(Src) 97 F (36.1 C) (Oral)  Resp 16  Ht 5\' 6"  (1.676 m)  Wt 179 lb 8 oz (81.421 kg)  BMI 28.97 kg/m2  SpO2 98% Physical Exam  VS noted Constitutional: Pt is oriented to person, place, and time. Appears well-developed and well-nourished.  HENT:  Head: Normocephalic and atraumatic.  Right Ear: External ear normal.  Left Ear: External ear normal.  Nose: Nose normal.  Mouth/Throat: Oropharynx is clear and moist.  Eyes: Conjunctivae and EOM are normal. Pupils are equal, round, and reactive to light.  Neck: Normal range of motion. Neck supple. No JVD present. No tracheal deviation present.  Cardiovascular: Normal rate, regular rhythm, normal heart sounds and intact distal pulses.   Pulmonary/Chest: Effort normal and breath sounds normal.  Abdominal: Soft. Bowel  sounds are normal. There is no tenderness.  Musculoskeletal: Normal range of motion. Exhibits no edema.  Lymphadenopathy:  Has no cervical adenopathy.  Neurological: Pt is alert and oriented to person, place, and time. Pt has  normal reflexes. No cranial nerve deficit.  Skin: Skin is warm and dry. No rash noted.  Psychiatric:  Has  normal mood and affect. Behavior is normal.     Assessment & Plan:

## 2011-06-12 NOTE — Assessment & Plan Note (Signed)
With bilat arm myaligas, for lipitor holiday fo4 wks, to re-start if not improved, and consider change to crestor if improved (pt to call) Lab Results  Component Value Date   LDLCALC 77 06/30/2010

## 2011-06-13 ENCOUNTER — Encounter: Payer: Self-pay | Admitting: Internal Medicine

## 2011-06-13 DIAGNOSIS — D509 Iron deficiency anemia, unspecified: Secondary | ICD-10-CM | POA: Insufficient documentation

## 2011-06-13 DIAGNOSIS — I2510 Atherosclerotic heart disease of native coronary artery without angina pectoris: Secondary | ICD-10-CM | POA: Insufficient documentation

## 2011-06-13 DIAGNOSIS — F419 Anxiety disorder, unspecified: Secondary | ICD-10-CM | POA: Insufficient documentation

## 2011-06-13 DIAGNOSIS — F329 Major depressive disorder, single episode, unspecified: Secondary | ICD-10-CM | POA: Insufficient documentation

## 2011-06-13 DIAGNOSIS — E785 Hyperlipidemia, unspecified: Secondary | ICD-10-CM | POA: Insufficient documentation

## 2011-06-13 DIAGNOSIS — I10 Essential (primary) hypertension: Secondary | ICD-10-CM | POA: Insufficient documentation

## 2011-06-13 DIAGNOSIS — F32A Depression, unspecified: Secondary | ICD-10-CM | POA: Insufficient documentation

## 2011-06-13 NOTE — Assessment & Plan Note (Signed)
No overt  Bleeding, for cbc f/u

## 2011-06-13 NOTE — Assessment & Plan Note (Signed)
Etiology unclear, Exam otherwise benign, to check labs as documented, follow with expectant management  

## 2011-07-13 ENCOUNTER — Telehealth: Payer: Self-pay

## 2011-07-13 MED ORDER — LOVASTATIN 40 MG PO TABS: 40.0000 mg | ORAL_TABLET | Freq: Every day | ORAL | Status: DC

## 2011-07-13 NOTE — Telephone Encounter (Signed)
Patient informed. 

## 2011-07-13 NOTE — Telephone Encounter (Signed)
Done per emr - changed to lovastatin

## 2011-07-13 NOTE — Telephone Encounter (Signed)
The patient has been off Lipitor for one month and doing much better. She would like alternative generic sent to [facility]. Sent copy of labs to patients home per pt. Request.

## 2011-07-31 ENCOUNTER — Encounter: Payer: Self-pay | Admitting: Gastroenterology

## 2012-06-01 ENCOUNTER — Other Ambulatory Visit: Payer: Self-pay | Admitting: Internal Medicine

## 2012-06-01 ENCOUNTER — Other Ambulatory Visit (INDEPENDENT_AMBULATORY_CARE_PROVIDER_SITE_OTHER): Payer: 59

## 2012-06-01 ENCOUNTER — Encounter: Payer: Self-pay | Admitting: Internal Medicine

## 2012-06-01 ENCOUNTER — Ambulatory Visit (INDEPENDENT_AMBULATORY_CARE_PROVIDER_SITE_OTHER): Payer: 59 | Admitting: Internal Medicine

## 2012-06-01 VITALS — BP 140/70 | HR 64 | Temp 97.4°F | Ht 66.0 in | Wt 176.0 lb

## 2012-06-01 DIAGNOSIS — Z0000 Encounter for general adult medical examination without abnormal findings: Secondary | ICD-10-CM

## 2012-06-01 DIAGNOSIS — Z791 Long term (current) use of non-steroidal anti-inflammatories (NSAID): Secondary | ICD-10-CM

## 2012-06-01 DIAGNOSIS — I10 Essential (primary) hypertension: Secondary | ICD-10-CM

## 2012-06-01 DIAGNOSIS — IMO0002 Reserved for concepts with insufficient information to code with codable children: Secondary | ICD-10-CM

## 2012-06-01 DIAGNOSIS — M1711 Unilateral primary osteoarthritis, right knee: Secondary | ICD-10-CM | POA: Insufficient documentation

## 2012-06-01 DIAGNOSIS — M1710 Unilateral primary osteoarthritis, unspecified knee: Secondary | ICD-10-CM

## 2012-06-01 LAB — URINALYSIS, ROUTINE W REFLEX MICROSCOPIC
Total Protein, Urine: NEGATIVE
Urine Glucose: NEGATIVE
pH: 5.5 (ref 5.0–8.0)

## 2012-06-01 LAB — LIPID PANEL
Cholesterol: 201 mg/dL — ABNORMAL HIGH (ref 0–200)
HDL: 76.7 mg/dL (ref >39.00)
Total CHOL/HDL Ratio: 3
Triglycerides: 89 mg/dL (ref 0.0–149.0)
VLDL: 17.8 mg/dL (ref 0.0–40.0)

## 2012-06-01 LAB — CBC WITH DIFFERENTIAL/PLATELET
Basophils Relative: 0.9 % (ref 0.0–3.0)
Eosinophils Absolute: 0.1 10*3/uL (ref 0.0–0.7)
Eosinophils Relative: 2.3 % (ref 0.0–5.0)
Hemoglobin: 12.9 g/dL (ref 12.0–15.0)
Lymphocytes Relative: 32.1 % (ref 12.0–46.0)
MCHC: 33.2 g/dL (ref 30.0–36.0)
MCV: 90.3 fl (ref 78.0–100.0)
Monocytes Absolute: 0.4 10*3/uL (ref 0.1–1.0)
Neutro Abs: 2.7 10*3/uL (ref 1.4–7.7)
RBC: 4.3 Mil/uL (ref 3.87–5.11)

## 2012-06-01 LAB — HEPATIC FUNCTION PANEL
Bilirubin, Direct: 0.1 mg/dL (ref 0.0–0.3)
Total Bilirubin: 0.5 mg/dL (ref 0.3–1.2)

## 2012-06-01 LAB — BASIC METABOLIC PANEL
Calcium: 9.2 mg/dL (ref 8.4–10.5)
Creatinine, Ser: 0.8 mg/dL (ref 0.4–1.2)

## 2012-06-01 MED ORDER — LOVASTATIN 40 MG PO TABS: 40.0000 mg | ORAL_TABLET | Freq: Every day | ORAL | Status: DC

## 2012-06-01 MED ORDER — LEVOFLOXACIN 250 MG PO TABS: 250.0000 mg | ORAL_TABLET | Freq: Every day | ORAL | Status: DC

## 2012-06-01 MED ORDER — TELMISARTAN-AMLODIPINE 80-5 MG PO TABS: 1.0000 | ORAL_TABLET | Freq: Every day | ORAL | Status: DC

## 2012-06-01 NOTE — Progress Notes (Deleted)
  Subjective:    Patient ID: Rhonda Norman, female    DOB: [birth year], 79 y.o.   MRN: [id number]  HPI    Review of Systems     Objective:   Physical Exam        Assessment & Plan:

## 2012-06-01 NOTE — Assessment & Plan Note (Signed)
Somewhat concerning with respect to risk, I asked pt to take prilosec 20 qd with this

## 2012-06-01 NOTE — Progress Notes (Addendum)
Subjective:    Patient ID: Rhonda Norman, female    DOB: Dec 03, 1932, 79 y.o.   MRN: [id number]  HPI Here for wellness and f/u;  Overall doing ok;  Pt denies CP, worsening SOB, DOE, wheezing, orthopnea, PND, worsening LE edema, palpitations, dizziness or syncope.  Pt denies neurological change such as new headache, facial or extremity weakness.  Pt denies polydipsia, polyuria, or low sugar symptoms. Pt states overall good compliance with treatment and medications, good tolerability, and has been trying to follow lower cholesterol diet.  Pt denies worsening depressive symptoms, suicidal ideation or panic. No fever, night sweats, wt loss, loss of appetite, or other constitutional symptoms.  Pt states good ability with ADL's, has low fall risk, home safety reviewed and adequate, no other significant changes in hearing or vision, and only occasionally active with exercise. Due for cataract surg bilat soon.  May need knee replacement soon on right per Dr Lajoyce Corners in the near future as well.  Of note she has been taking alleve BID for about 6 mo without GI complaints or known renal dysfxn Past Medical History  Diagnosis Date  . Impaired glucose tolerance 06/12/2011  . CAD (coronary artery disease)     non obstructive, Dr Elease Hashimoto, april 2010  . Anemia, iron deficiency   . Hyperlipidemia   . Anxiety   . Depression   . Hypertension    Past Surgical History  Procedure Date  . Abdominal hysterectomy     reports that she has never smoked. She does not have any smokeless tobacco history on file. She reports that she drinks alcohol. She reports that she does not use illicit drugs. family history includes Cancer in her mother and Hypertension in her father. Allergies  Allergen Reactions  . Codeine   . Lipitor (Atorvastatin Calcium) Other (See Comments)    myalgia  . Penicillins    Current Outpatient Prescriptions on File Prior to Visit  Medication Sig Dispense Refill  . aspirin 81 MG tablet Take 81 mg  by mouth daily.      . cholecalciferol (VITAMIN D) 400 UNITS TABS Take 1,000 Units by mouth daily.      . fish oil-omega-3 fatty acids 1000 MG capsule Take 2 g by mouth daily.      Marland Kitchen lovastatin (MEVACOR) 40 MG tablet Take 1 tablet (40 mg total) by mouth at bedtime.  90 tablet  3  . Telmisartan-Amlodipine 80-5 MG TABS Take 1 tablet by mouth daily.  90 tablet  3  . [DISCONTINUED] atorvastatin (LIPITOR) 40 MG tablet Take 1 tablet (40 mg total) by mouth daily.  90 tablet  3   Review of Systems Constitutional: Negative for diaphoresis, activity change, appetite change or unexpected weight change.  HENT: Negative for hearing loss, ear pain, facial swelling, mouth sores and neck stiffness.   Eyes: Negative for pain, redness and visual disturbance.  Respiratory: Negative for shortness of breath and wheezing.   Cardiovascular: Negative for chest pain and palpitations.  Gastrointestinal: Negative for diarrhea, blood in stool, abdominal distention or other pain Genitourinary: Negative for hematuria, flank pain or change in urine volume.  Musculoskeletal: Negative for myalgias and joint swelling.  Skin: Negative for color change and wound.  Neurological: Negative for syncope and numbness. other than noted Hematological: Negative for adenopathy.  Psychiatric/Behavioral: Negative for hallucinations, self-injury, decreased concentration and agitation.      Objective:   Physical Exam BP 140/70  Pulse 64  Temp 97.4 F (36.3 C) (Oral)  Ht 5\' 6"  (1.676  m)  Wt 176 lb (79.833 kg)  BMI 28.41 kg/m2  SpO2 97% VS noted,  Constitutional: Pt is oriented to person, place, and time. Appears well-developed and well-nourished.  Head: Normocephalic and atraumatic.  Right Ear: External ear normal.  Left Ear: External ear normal.  Nose: Nose normal.  Mouth/Throat: Oropharynx is clear and moist.  Eyes: Conjunctivae and EOM are normal. Pupils are equal, round, and reactive to light.  Neck: Normal range of  motion. Neck supple. No JVD present. No tracheal deviation present.  Cardiovascular: Normal rate, regular rhythm, normal heart sounds and intact distal pulses.   Pulmonary/Chest: Effort normal and breath sounds normal.  Abdominal: Soft. Bowel sounds are normal. There is no tenderness. No HSM  Musculoskeletal: Normal range of motion. Exhibits no edema.  Lymphadenopathy:  Has no cervical adenopathy.  Neurological: Pt is alert and oriented to person, place, and time. Pt has normal reflexes. No cranial nerve deficit. Motor/gait intact Skin: Skin is warm and dry. No rash noted.  Psychiatric:  Has  normal mood and affect. Behavior is normal. 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

## 2012-06-01 NOTE — Assessment & Plan Note (Signed)
stable overall by history and exam, recent data reviewed with pt, and pt to continue medical treatment as before,  to f/u any worsening symptoms or concerns BP Readings from Last 3 Encounters:  06/01/12 140/70  06/12/11 146/78  06/30/10 140/86

## 2012-06-01 NOTE — Patient Instructions (Addendum)
You had the pneumonia shot today Please continue all other medications as before, and refills have been done if requested. Please have the pharmacy call with any other refills you may need. You are otherwise up to date with prevention measures today. Please go to the LAB in the Basement (turn left off the elevator) for the tests to be done today You will be contacted by phone if any changes need to be made immediately.  Otherwise, you will receive a letter about your results with an explanation  Please remember to sign up for My Chart if you have not done so, as this will be important to you in the future with finding out test results, communicating by private email, and scheduling acute appointments online when needed. Daisey Must with your surgury Please return in 1 year for your yearly visit, or sooner if needed, with Lab testing done 3-5 days before

## 2012-06-01 NOTE — Assessment & Plan Note (Signed)
Pending lab eval, pt is OK for right knee surgury /TKR

## 2012-06-01 NOTE — Assessment & Plan Note (Signed)

## 2012-06-02 ENCOUNTER — Ambulatory Visit: Payer: 59

## 2012-06-02 ENCOUNTER — Encounter: Payer: Self-pay | Admitting: Internal Medicine

## 2012-06-02 DIAGNOSIS — Z0000 Encounter for general adult medical examination without abnormal findings: Secondary | ICD-10-CM

## 2012-06-02 DIAGNOSIS — E119 Type 2 diabetes mellitus without complications: Secondary | ICD-10-CM | POA: Insufficient documentation

## 2012-06-02 DIAGNOSIS — R7309 Other abnormal glucose: Secondary | ICD-10-CM

## 2012-10-05 ENCOUNTER — Telehealth: Payer: Self-pay | Admitting: Internal Medicine

## 2012-10-05 MED ORDER — LEVOFLOXACIN 250 MG PO TABS: 250.0000 mg | ORAL_TABLET | Freq: Every day | ORAL | Status: DC

## 2012-10-05 NOTE — Telephone Encounter (Signed)
Pt called req to speak to Dr. Raphael Gibney nurse. Please call pt back

## 2012-10-05 NOTE — Telephone Encounter (Signed)
Done erx 

## 2012-10-05 NOTE — Telephone Encounter (Signed)
The patient had a UTI in February and PCP prescribed antibiotic.  The patient only took about half of the prescription as dropped half the pills accidentally down the drain when opening the bottle.  The patient is having some slight tingling when urinating and would like another antibiotic sent to [facility].  She states she is going out of town tomorrow for 2 weeks and cannot come in due to time.

## 2012-10-05 NOTE — Telephone Encounter (Signed)
Patient informed rx requested has been sent in.

## 2012-12-14 ENCOUNTER — Telehealth: Payer: Self-pay | Admitting: *Deleted

## 2012-12-14 NOTE — Telephone Encounter (Signed)
Pt called requesting Omeprazole 40mg  Rx, medication is not on med list.  Please advise

## 2012-12-14 NOTE — Telephone Encounter (Signed)
I assume this is for recently worsening reflux?  OK to ask pt to try otc prilosec 20 mg, and consider OV as she has not required this med for at least the last 18 mo and will need re-eval

## 2012-12-14 NOTE — Telephone Encounter (Signed)
Called the patient informed of MD instructions. 

## 2013-05-16 ENCOUNTER — Other Ambulatory Visit: Payer: Self-pay

## 2013-05-16 MED ORDER — LOVASTATIN 40 MG PO TABS: 40.0000 mg | ORAL_TABLET | Freq: Every day | ORAL | Status: DC

## 2013-05-16 MED ORDER — TELMISARTAN-AMLODIPINE 80-5 MG PO TABS: 1.0000 | ORAL_TABLET | Freq: Every day | ORAL | Status: DC

## 2013-05-24 ENCOUNTER — Telehealth: Payer: Self-pay | Admitting: *Deleted

## 2013-05-24 NOTE — Telephone Encounter (Signed)
Patient left vm concerning bladder infection. Patient is requesting med.

## 2013-05-25 NOTE — Telephone Encounter (Signed)
Pt has not been seen since feb 2014  Please make ROV, can see Nancy/PA, as office policy is to see pt's to make sure antibiotics are appropriately used

## 2013-05-25 NOTE — Telephone Encounter (Signed)
Pt called again.  She declines making an appt.

## 2013-05-26 NOTE — Telephone Encounter (Signed)
Called and informed the patient of MD instructions.  She states she will see PCP the end of the month and will address at that time.

## 2013-06-19 ENCOUNTER — Other Ambulatory Visit: Payer: Self-pay | Admitting: Internal Medicine

## 2013-06-28 ENCOUNTER — Telehealth: Payer: Self-pay

## 2013-06-28 NOTE — Telephone Encounter (Signed)
Pharmacist at [facility] informed ok to change.

## 2013-06-28 NOTE — Telephone Encounter (Signed)
Combo of Telmis/amlodipine 80-5 mg tab is on back order.  Can they split into 2 rx's? Telmisartan 80 #90 and amlodipine 5 mg #90??

## 2013-06-28 NOTE — Telephone Encounter (Signed)
Ok for verbal 

## 2013-06-29 ENCOUNTER — Encounter: Payer: Self-pay | Admitting: Internal Medicine

## 2013-06-29 ENCOUNTER — Other Ambulatory Visit (INDEPENDENT_AMBULATORY_CARE_PROVIDER_SITE_OTHER): Payer: 59

## 2013-06-29 ENCOUNTER — Ambulatory Visit (INDEPENDENT_AMBULATORY_CARE_PROVIDER_SITE_OTHER): Payer: Medicare Other | Admitting: Internal Medicine

## 2013-06-29 DIAGNOSIS — E1165 Type 2 diabetes mellitus with hyperglycemia: Principal | ICD-10-CM

## 2013-06-29 DIAGNOSIS — R319 Hematuria, unspecified: Secondary | ICD-10-CM

## 2013-06-29 DIAGNOSIS — Z23 Encounter for immunization: Secondary | ICD-10-CM

## 2013-06-29 DIAGNOSIS — F3289 Other specified depressive episodes: Secondary | ICD-10-CM

## 2013-06-29 DIAGNOSIS — IMO0001 Reserved for inherently not codable concepts without codable children: Secondary | ICD-10-CM

## 2013-06-29 DIAGNOSIS — N390 Urinary tract infection, site not specified: Secondary | ICD-10-CM

## 2013-06-29 DIAGNOSIS — I10 Essential (primary) hypertension: Secondary | ICD-10-CM

## 2013-06-29 DIAGNOSIS — E785 Hyperlipidemia, unspecified: Secondary | ICD-10-CM

## 2013-06-29 DIAGNOSIS — F329 Major depressive disorder, single episode, unspecified: Secondary | ICD-10-CM

## 2013-06-29 LAB — HEPATIC FUNCTION PANEL
ALK PHOS: 77 U/L (ref 39–117)
ALT: 24 U/L (ref 0–35)
AST: 24 U/L (ref 0–37)
Albumin: 4 g/dL (ref 3.5–5.2)
BILIRUBIN DIRECT: 0 mg/dL (ref 0.0–0.3)
TOTAL PROTEIN: 7 g/dL (ref 6.0–8.3)
Total Bilirubin: 0.8 mg/dL (ref 0.3–1.2)

## 2013-06-29 LAB — BASIC METABOLIC PANEL
BUN: 20 mg/dL (ref 6–23)
CALCIUM: 9.6 mg/dL (ref 8.4–10.5)
CHLORIDE: 105 meq/L (ref 96–112)
CO2: 26 meq/L (ref 19–32)
Creatinine, Ser: 0.9 mg/dL (ref 0.4–1.2)
GFR: 68.61 mL/min (ref >60.00)
Glucose, Bld: 87 mg/dL (ref 70–99)
Potassium: 4.5 mEq/L (ref 3.5–5.1)
Sodium: 137 mEq/L (ref 135–145)

## 2013-06-29 LAB — URINALYSIS, ROUTINE W REFLEX MICROSCOPIC
BILIRUBIN URINE: NEGATIVE
KETONES UR: NEGATIVE
Nitrite: NEGATIVE
Specific Gravity, Urine: 1.025 (ref 1.000–1.030)
URINE GLUCOSE: NEGATIVE
UROBILINOGEN UA: 0.2 (ref 0.0–1.0)
pH: 6 (ref 5.0–8.0)

## 2013-06-29 LAB — MICROALBUMIN / CREATININE URINE RATIO
CREATININE, U: 146.7 mg/dL
MICROALB UR: 13.3 mg/dL — AB (ref 0.0–1.9)
Microalb Creat Ratio: 9.1 mg/g (ref 0.0–30.0)

## 2013-06-29 LAB — POCT URINALYSIS DIPSTICK
Bilirubin, UA: NEGATIVE
Glucose, UA: NEGATIVE
Ketones, UA: NEGATIVE
Nitrite, UA: NEGATIVE
PH UA: 5
PROTEIN UA: NEGATIVE
SPEC GRAV UA: 1.015
UROBILINOGEN UA: 0.2

## 2013-06-29 LAB — LIPID PANEL
CHOL/HDL RATIO: 4
Cholesterol: 237 mg/dL — ABNORMAL HIGH (ref 0–200)
HDL: 62.3 mg/dL (ref >39.00)
LDL Cholesterol: 149 mg/dL — ABNORMAL HIGH (ref 0–99)
Triglycerides: 127 mg/dL (ref 0.0–149.0)
VLDL: 25.4 mg/dL (ref 0.0–40.0)

## 2013-06-29 LAB — CBC WITH DIFFERENTIAL/PLATELET
BASOS ABS: 0.1 10*3/uL (ref 0.0–0.1)
Basophils Relative: 0.9 % (ref 0.0–3.0)
Eosinophils Absolute: 0.1 10*3/uL (ref 0.0–0.7)
Eosinophils Relative: 2.3 % (ref 0.0–5.0)
HCT: 39.8 % (ref 36.0–46.0)
HEMOGLOBIN: 13.2 g/dL (ref 12.0–15.0)
LYMPHS ABS: 1.9 10*3/uL (ref 0.7–4.0)
LYMPHS PCT: 34.8 % (ref 12.0–46.0)
MCHC: 33.2 g/dL (ref 30.0–36.0)
MCV: 88.5 fl (ref 78.0–100.0)
Monocytes Absolute: 0.5 10*3/uL (ref 0.1–1.0)
Monocytes Relative: 8.7 % (ref 3.0–12.0)
NEUTROS ABS: 2.9 10*3/uL (ref 1.4–7.7)
Neutrophils Relative %: 53.3 % (ref 43.0–77.0)
Platelets: 225 10*3/uL (ref 150.0–400.0)
RBC: 4.49 Mil/uL (ref 3.87–5.11)
RDW: 13.5 % (ref 11.5–14.6)
WBC: 5.4 10*3/uL (ref 4.5–10.5)

## 2013-06-29 LAB — TSH: TSH: 1.87 u[IU]/mL (ref 0.35–5.50)

## 2013-06-29 LAB — HEMOGLOBIN A1C: Hgb A1c MFr Bld: 6.6 % — ABNORMAL HIGH (ref 4.6–6.5)

## 2013-06-29 MED ORDER — CEPHALEXIN 500 MG PO CAPS: 500.0000 mg | ORAL_CAPSULE | Freq: Four times a day (QID) | ORAL | Status: DC

## 2013-06-29 MED ORDER — IRBESARTAN 300 MG PO TABS: 300.0000 mg | ORAL_TABLET | Freq: Every day | ORAL | Status: DC

## 2013-06-29 MED ORDER — AMLODIPINE BESYLATE 5 MG PO TABS
5.0000 mg | ORAL_TABLET | Freq: Every day | ORAL | Status: DC
Start: 2013-06-29 — End: 2014-02-16

## 2013-06-29 MED ORDER — LOVASTATIN 40 MG PO TABS: 40.0000 mg | ORAL_TABLET | Freq: Every day | ORAL | Status: DC

## 2013-06-29 NOTE — Assessment & Plan Note (Addendum)
stable overall by history and exam, recent data reviewed with pt, and pt to continue medical treatment as before except to change to dual genric meds in place of the current twynsta,  to f/u any worsening symptoms or concerns BP Readings from Last 3 Encounters:  06/01/12 140/70  06/12/11 146/78  06/30/10 140/86

## 2013-06-29 NOTE — Progress Notes (Signed)
Subjective:    Patient ID: Rhonda Norman, female    DOB: December 18, 1932, 80 y.o.   MRN: [id number]  HPI  Here for yearly f/u;  Overall doing ok;  Pt denies CP, worsening SOB, DOE, wheezing, orthopnea, PND, worsening LE edema, palpitations, dizziness or syncope.  Pt denies neurological change such as new headache, facial or extremity weakness.  Pt denies polydipsia, polyuria, or low sugar symptoms. Pt states overall good compliance with treatment and medications, good tolerability, and has been trying to follow lower cholesterol diet.  Pt denies worsening depressive symptoms, suicidal ideation or panic. No fever, night sweats, wt loss, loss of appetite, or other constitutional symptoms.  Pt states good ability with ADL's, has low fall risk, home safety reviewed and adequate, no other significant changes in hearing or vision,  Goes to the senior ctr 3 times per wk, the balance class has helped quit a bit. Declines mammog and colon screening.   Needs BP med changes as not covered . C/o  urinary symptoms such as frequency, urgency, and small hematuria but no dyrusia, flank pain, or n/v, fever, chills. Denies worsening depressive symptoms, suicidal ideation, or panic; has ongoing anxiety, Past Medical History  Diagnosis Date  . Impaired glucose tolerance 06/12/2011  . CAD (coronary artery disease)     non obstructive, [name], april 2010  . Anemia, iron deficiency   . Hyperlipidemia   . Anxiety   . Depression   . Hypertension   . Type II or unspecified type diabetes mellitus without mention of complication, uncontrolled 06/02/2012   Past Surgical History  Procedure Laterality Date  . Abdominal hysterectomy      reports that she has never smoked. She does not have any smokeless tobacco history on file. She reports that she drinks alcohol. She reports that she does not use illicit drugs. family history includes Cancer in her mother; Hypertension in her father. Allergies  Allergen Reactions  .  Codeine   . Lipitor [Atorvastatin Calcium] Other (See Comments)    myalgia  . Penicillins    Current Outpatient Prescriptions on File Prior to Visit  Medication Sig Dispense Refill  . aspirin 81 MG tablet Take 81 mg by mouth daily.      . cholecalciferol (VITAMIN D) 400 UNITS TABS Take 1,000 Units by mouth daily.      . fish oil-omega-3 fatty acids 1000 MG capsule Take 2 g by mouth daily.      . Telmisartan-Amlodipine 80-5 MG TABS TAKE ONE TABLET BY MOUTH ONCE DAILY  90 tablet  3  . [DISCONTINUED] atorvastatin (LIPITOR) 40 MG tablet Take 1 tablet (40 mg total) by mouth daily.  90 tablet  3   No current facility-administered medications on file prior to visit.   Review of Systems Constitutional: Negative for diaphoresis, activity change, appetite change or unexpected weight change.  HENT: Negative for hearing loss, ear pain, facial swelling, mouth sores and neck stiffness.   Eyes: Negative for pain, redness and visual disturbance.  Respiratory: Negative for shortness of breath and wheezing.   Cardiovascular: Negative for chest pain and palpitations.  Gastrointestinal: Negative for diarrhea, blood in stool, abdominal distention or other pain Genitourinary: Negative for hematuria, flank pain or change in urine volume.  Musculoskeletal: Negative for myalgias and joint swelling.  Skin: Negative for color change and wound.  Neurological: Negative for syncope and numbness. other than noted Hematological: Negative for adenopathy.  Psychiatric/Behavioral: Negative for hallucinations, self-injury, decreased concentration and agitation.  Objective:   Physical Exam There were no vitals taken for this visit. VS noted,  Constitutional: Pt is oriented to person, place, and time. Appears well-developed and well-nourished.  Head: Normocephalic and atraumatic.  Right Ear: External ear normal.  Left Ear: External ear normal.  Nose: Nose normal.  Mouth/Throat: Oropharynx is clear and moist.    Eyes: Conjunctivae and EOM are normal. Pupils are equal, round, and reactive to light.  Neck: Normal range of motion. Neck supple. No JVD present. No tracheal deviation present.  Cardiovascular: Normal rate, regular rhythm, normal heart sounds and intact distal pulses.   Pulmonary/Chest: Effort normal and breath sounds normal.  Abdominal: Soft. Bowel sounds are normal. There is no tenderness. No HSM  Musculoskeletal: Normal range of motion. Exhibits no edema.  Lymphadenopathy:  Has no cervical adenopathy.  Neurological: Pt is alert and oriented to person, place, and time. Pt has normal reflexes. No cranial nerve deficit.  Skin: Skin is warm and dry. No rash noted.  Psychiatric:  Has 1+ nervous mood and affect. Behavior is normal.     Assessment & Plan:

## 2013-06-29 NOTE — Addendum Note (Signed)
Addended by: Sharon Seller B on: 06/29/2013 11:15 AM   Modules accepted: Orders

## 2013-06-29 NOTE — Assessment & Plan Note (Signed)
stable overall by history and exam, recent data reviewed with pt, and pt to continue medical treatment as before,  to f/u any worsening symptoms or concerns Lab Results  Component Value Date   WBC 4.8 06/01/2012   HGB 12.9 06/01/2012   HCT 38.8 06/01/2012   PLT 198.0 06/01/2012   GLUCOSE 116* 06/01/2012   CHOL 201* 06/01/2012   TRIG 89.0 06/01/2012   HDL 76.70 06/01/2012   LDLDIRECT 94.1 06/01/2012   LDLCALC 79 06/12/2011   ALT 14 06/01/2012   AST 17 06/01/2012   NA 138 06/01/2012   K 4.0 06/01/2012   CL 106 06/01/2012   CREATININE 0.8 06/01/2012   BUN 21 06/01/2012   CO2 26 06/01/2012   TSH 1.34 06/01/2012   HGBA1C 6.6* 06/02/2012

## 2013-06-29 NOTE — Assessment & Plan Note (Signed)
stable overall by history and exam, recent data reviewed with pt, and pt to continue medical treatment as before,  to f/u any worsening symptoms or concerns Lab Results  Component Value Date   HGBA1C 6.6* 06/02/2012

## 2013-06-29 NOTE — Progress Notes (Signed)
Pre visit review using our clinic review tool, if applicable. No additional management support is needed unless otherwise documented below in the visit note. 

## 2013-06-29 NOTE — Assessment & Plan Note (Addendum)
By report, diff includes uti vs malignancy or other; for antibx trial, f/u urine cx, if neg should likely see urology - r/o malignancy  Note:  Total time for pt hx, exam, review of record with pt in the room, determination of diagnoses and plan for further eval and tx is > 40 min, with over 50% spent in coordination and counseling of patient

## 2013-06-29 NOTE — Patient Instructions (Addendum)
You had the new Prevnar pneumonia shot today  Please take all new medication as prescribed - the antibiotic, as well as the two new BP medications (for inplace of the one old one)  The specimen will be sent for the culture, but if negative, you may want to see urology due to the blood Please continue all other medications as before, and refills have been done if requested. Please have the pharmacy call with any other refills you may need.  Please continue your efforts at being more active, low cholesterol diet, and weight control. You are otherwise up to date with prevention measures today.  Please call if you change your mind about the mammogram and colonoscopy  Please keep your appointments with your specialists as you have planned  Please go to the LAB in the Basement (turn left off the elevator) for the tests to be done today You will be contacted by phone if any changes need to be made immediately.  Otherwise, you will receive a letter about your results with an explanation, but please check with MyChart first.   Please return in 6 months, or sooner if needed

## 2013-06-29 NOTE — Assessment & Plan Note (Signed)
stable overall by history and exam, recent data reviewed with pt, and pt to continue medical treatment as before,  to f/u any worsening symptoms or concerns Lab Results  Component Value Date   LDLCALC 79 06/12/2011   For f/u labs

## 2013-06-30 LAB — URINE CULTURE: Colony Count: 30000

## 2013-07-04 ENCOUNTER — Other Ambulatory Visit: Payer: Self-pay | Admitting: Internal Medicine

## 2013-07-04 ENCOUNTER — Encounter: Payer: Self-pay | Admitting: Internal Medicine

## 2013-07-04 ENCOUNTER — Telehealth: Payer: Self-pay | Admitting: *Deleted

## 2013-07-04 DIAGNOSIS — R310 Gross hematuria: Secondary | ICD-10-CM

## 2013-07-04 NOTE — Telephone Encounter (Signed)
Returned the call and informed of lab results.

## 2013-07-04 NOTE — Telephone Encounter (Signed)
Patient phoned stating she was returning [name]'s phone call.  CB# [phone]

## 2013-08-04 ENCOUNTER — Telehealth: Payer: Self-pay | Admitting: Internal Medicine

## 2013-08-04 MED ORDER — TRAZODONE HCL 50 MG PO TABS: 25.0000 mg | ORAL_TABLET | Freq: Every evening | ORAL | Status: DC | PRN

## 2013-08-04 NOTE — Telephone Encounter (Signed)
Patient informed. 

## 2013-08-04 NOTE — Telephone Encounter (Signed)
Ok to try trazodone qhs prn - done erx 

## 2013-08-04 NOTE — Telephone Encounter (Signed)
Pt wants something to help her sleep.  She meant to discuss this at the last appt.

## 2013-08-15 ENCOUNTER — Telehealth: Payer: Self-pay | Admitting: *Deleted

## 2013-08-15 MED ORDER — QUETIAPINE FUMARATE 50 MG PO TABS: 50.0000 mg | ORAL_TABLET | Freq: Every day | ORAL | Status: DC

## 2013-08-15 NOTE — Telephone Encounter (Signed)
Pt called states Trazadone is not effective, requesting Seriquel Rx.  Please advise

## 2013-08-15 NOTE — Telephone Encounter (Signed)
Done erx 

## 2013-11-29 ENCOUNTER — Telehealth: Payer: Self-pay

## 2013-11-29 NOTE — Telephone Encounter (Signed)
Fax from pharmacy stating that the patient said she opened her bottle near the sink and some went down the drain.  She has around 10 tabs left and would like again #90??

## 2013-11-29 NOTE — Telephone Encounter (Signed)
Need to clarify name of medication please

## 2013-11-30 MED ORDER — QUETIAPINE FUMARATE 50 MG PO TABS: 50.0000 mg | ORAL_TABLET | Freq: Every day | ORAL | Status: DC

## 2013-11-30 NOTE — Telephone Encounter (Signed)
Sorry my mistake the medication is Quetiapine fumarat 50 mg.

## 2013-11-30 NOTE — Telephone Encounter (Signed)
Done erx 

## 2013-12-06 NOTE — Telephone Encounter (Signed)
Received fax today 12/06/13 from pharmacy stating the patient is requesting  #90 of Seroquel 50 mg.

## 2013-12-07 MED ORDER — QUETIAPINE FUMARATE 50 MG PO TABS: 50.0000 mg | ORAL_TABLET | Freq: Every day | ORAL | Status: DC

## 2013-12-07 NOTE — Telephone Encounter (Signed)
PCP sent in electronically did not receive hardcopy.

## 2013-12-07 NOTE — Telephone Encounter (Signed)
Done hardcopy to robin  

## 2013-12-07 NOTE — Addendum Note (Signed)
Addended by: Biagio Borg on: 12/07/2013 01:01 PM   Modules accepted: Orders

## 2013-12-19 ENCOUNTER — Other Ambulatory Visit: Payer: Self-pay | Admitting: Urology

## 2013-12-28 ENCOUNTER — Encounter (HOSPITAL_BASED_OUTPATIENT_CLINIC_OR_DEPARTMENT_OTHER): Payer: Self-pay | Admitting: *Deleted

## 2013-12-28 NOTE — Progress Notes (Signed)
Pt instructed npo p mn 9/9 x norvasc w sip of water. To [facility] 9/10 @ 1115.  Needs ekg, istat 8 on arrival. Pt to work on caregiver for overnight.

## 2014-01-04 ENCOUNTER — Encounter (HOSPITAL_BASED_OUTPATIENT_CLINIC_OR_DEPARTMENT_OTHER): Payer: Medicare Other | Admitting: Anesthesiology

## 2014-01-04 ENCOUNTER — Encounter (HOSPITAL_BASED_OUTPATIENT_CLINIC_OR_DEPARTMENT_OTHER)
Admission: RE | Disposition: A | Discharge status: Home / Self Care | Payer: Self-pay | Source: Ambulatory Visit | Attending: Urology

## 2014-01-04 ENCOUNTER — Encounter (HOSPITAL_BASED_OUTPATIENT_CLINIC_OR_DEPARTMENT_OTHER): Payer: Self-pay | Admitting: *Deleted

## 2014-01-04 ENCOUNTER — Ambulatory Visit (HOSPITAL_BASED_OUTPATIENT_CLINIC_OR_DEPARTMENT_OTHER)
Admission: RE | Admit: 2014-01-04 | Discharge: 2014-01-04 | Disposition: A | Discharge status: Home / Self Care | Payer: Medicare Other | Source: Ambulatory Visit | Attending: Urology | Admitting: Urology

## 2014-01-04 ENCOUNTER — Ambulatory Visit (HOSPITAL_BASED_OUTPATIENT_CLINIC_OR_DEPARTMENT_OTHER): Payer: Medicare Other | Admitting: Anesthesiology

## 2014-01-04 DIAGNOSIS — C679 Malignant neoplasm of bladder, unspecified: Secondary | ICD-10-CM | POA: Insufficient documentation

## 2014-01-04 DIAGNOSIS — E119 Type 2 diabetes mellitus without complications: Secondary | ICD-10-CM | POA: Diagnosis not present

## 2014-01-04 DIAGNOSIS — F341 Dysthymic disorder: Secondary | ICD-10-CM | POA: Diagnosis not present

## 2014-01-04 DIAGNOSIS — M1990 Unspecified osteoarthritis, unspecified site: Secondary | ICD-10-CM | POA: Insufficient documentation

## 2014-01-04 DIAGNOSIS — I2510 Atherosclerotic heart disease of native coronary artery without angina pectoris: Secondary | ICD-10-CM | POA: Insufficient documentation

## 2014-01-04 DIAGNOSIS — I10 Essential (primary) hypertension: Secondary | ICD-10-CM | POA: Insufficient documentation

## 2014-01-04 DIAGNOSIS — Q619 Cystic kidney disease, unspecified: Secondary | ICD-10-CM | POA: Diagnosis not present

## 2014-01-04 DIAGNOSIS — K219 Gastro-esophageal reflux disease without esophagitis: Secondary | ICD-10-CM | POA: Insufficient documentation

## 2014-01-04 DIAGNOSIS — E785 Hyperlipidemia, unspecified: Secondary | ICD-10-CM | POA: Insufficient documentation

## 2014-01-04 HISTORY — PX: CYSTOSCOPY W/ URETERAL STENT PLACEMENT: SHX1429

## 2014-01-04 HISTORY — DX: Unspecified osteoarthritis, unspecified site: M19.90

## 2014-01-04 HISTORY — PX: TRANSURETHRAL RESECTION OF BLADDER TUMOR WITH GYRUS (TURBT-GYRUS): SHX6458

## 2014-01-04 HISTORY — DX: Gastro-esophageal reflux disease without esophagitis: K21.9

## 2014-01-04 LAB — POCT I-STAT, CHEM 8
BUN: 12 mg/dL (ref 6–23)
Calcium, Ion: 1.27 mmol/L (ref 1.13–1.30)
Chloride: 108 mEq/L (ref 96–112)
Creatinine, Ser: 0.8 mg/dL (ref 0.50–1.10)
GLUCOSE: 119 mg/dL — AB (ref 70–99)
HCT: 41 % (ref 36.0–46.0)
Hemoglobin: 13.9 g/dL (ref 12.0–15.0)
Potassium: 3.8 mEq/L (ref 3.7–5.3)
Sodium: 143 mEq/L (ref 137–147)
TCO2: 21 mmol/L (ref 0–100)

## 2014-01-04 SURGERY — TRANSURETHRAL RESECTION OF BLADDER TUMOR WITH GYRUS (TURBT-GYRUS)
Anesthesia: General | Site: Ureter

## 2014-01-04 MED ORDER — ONDANSETRON HCL 4 MG/2ML IJ SOLN
INTRAMUSCULAR | Status: DC | PRN
  Administered 2014-01-04: 4 mg via INTRAVENOUS

## 2014-01-04 MED ORDER — KETOROLAC TROMETHAMINE 30 MG/ML IJ SOLN
15.0000 mg | Freq: Once | INTRAMUSCULAR | Status: DC | PRN
  Filled 2014-01-04: qty 1

## 2014-01-04 MED ORDER — IOHEXOL 300 MG/ML  SOLN
INTRAMUSCULAR | Status: DC | PRN
  Administered 2014-01-04: 17 mL

## 2014-01-04 MED ORDER — GENTAMICIN IN SALINE 1.6-0.9 MG/ML-% IV SOLN
80.0000 mg | INTRAVENOUS | Status: AC
  Administered 2014-01-04: 340 mg via INTRAVENOUS
  Filled 2014-01-04: qty 50

## 2014-01-04 MED ORDER — FENTANYL CITRATE 0.05 MG/ML IJ SOLN
25.0000 ug | INTRAMUSCULAR | Status: DC | PRN
  Filled 2014-01-04: qty 1

## 2014-01-04 MED ORDER — PROMETHAZINE HCL 25 MG/ML IJ SOLN
6.2500 mg | INTRAMUSCULAR | Status: DC | PRN
  Filled 2014-01-04: qty 1

## 2014-01-04 MED ORDER — SODIUM CHLORIDE 0.9 % IR SOLN
Status: DC | PRN
  Administered 2014-01-04: 6000 mL via INTRAVESICAL
  Administered 2014-01-04: 6500 mL via INTRAVESICAL

## 2014-01-04 MED ORDER — MIDAZOLAM HCL 2 MG/2ML IJ SOLN
INTRAMUSCULAR | Status: AC
  Filled 2014-01-04: qty 2

## 2014-01-04 MED ORDER — FENTANYL CITRATE 0.05 MG/ML IJ SOLN
INTRAMUSCULAR | Status: DC | PRN
  Administered 2014-01-04 (×2): 25 ug via INTRAVENOUS

## 2014-01-04 MED ORDER — LIDOCAINE HCL (CARDIAC) 20 MG/ML IV SOLN
INTRAVENOUS | Status: DC | PRN
  Administered 2014-01-04: 80 mg via INTRAVENOUS

## 2014-01-04 MED ORDER — LACTATED RINGERS IV SOLN
INTRAVENOUS | Status: DC | PRN
  Administered 2014-01-04: 12:00:00 via INTRAVENOUS

## 2014-01-04 MED ORDER — LACTATED RINGERS IV SOLN
INTRAVENOUS | Status: DC
  Administered 2014-01-04 (×2): via INTRAVENOUS
  Filled 2014-01-04: qty 1000

## 2014-01-04 MED ORDER — DEXAMETHASONE SODIUM PHOSPHATE 10 MG/ML IJ SOLN
INTRAMUSCULAR | Status: DC | PRN
  Administered 2014-01-04: 10 mg via INTRAVENOUS

## 2014-01-04 MED ORDER — FENTANYL CITRATE 0.05 MG/ML IJ SOLN
INTRAMUSCULAR | Status: AC
  Filled 2014-01-04: qty 4

## 2014-01-04 MED ORDER — GENTAMICIN SULFATE 40 MG/ML IJ SOLN
5.0000 mg/kg | Freq: Once | INTRAMUSCULAR | Status: DC
  Filled 2014-01-04: qty 8.5

## 2014-01-04 MED ORDER — TRAMADOL HCL 50 MG PO TABS: 50.0000 mg | ORAL_TABLET | Freq: Four times a day (QID) | ORAL | Status: DC | PRN

## 2014-01-04 MED ORDER — EPHEDRINE SULFATE 50 MG/ML IJ SOLN
INTRAMUSCULAR | Status: DC | PRN
  Administered 2014-01-04: 10 mg via INTRAVENOUS

## 2014-01-04 MED ORDER — ACETAMINOPHEN 10 MG/ML IV SOLN
INTRAVENOUS | Status: DC | PRN
  Administered 2014-01-04: 1000 mg via INTRAVENOUS

## 2014-01-04 MED ORDER — SUCCINYLCHOLINE CHLORIDE 20 MG/ML IJ SOLN
INTRAMUSCULAR | Status: DC | PRN
  Administered 2014-01-04: 25 mg via INTRAVENOUS
  Administered 2014-01-04: 50 mg via INTRAVENOUS

## 2014-01-04 MED ORDER — PROPOFOL INFUSION 10 MG/ML OPTIME
INTRAVENOUS | Status: DC | PRN
  Administered 2014-01-04: 130 mL via INTRAVENOUS
  Administered 2014-01-04: 50 mL via INTRAVENOUS

## 2014-01-04 MED ORDER — MIDAZOLAM HCL 5 MG/5ML IJ SOLN
INTRAMUSCULAR | Status: DC | PRN
  Administered 2014-01-04 (×2): 1 mg via INTRAVENOUS

## 2014-01-04 MED ORDER — SENNOSIDES-DOCUSATE SODIUM 8.6-50 MG PO TABS: 1.0000 | ORAL_TABLET | Freq: Two times a day (BID) | ORAL | Status: DC

## 2014-01-04 SURGICAL SUPPLY — 19 items
BAG DRN ANRFLXCHMBR STRAP LEK (BAG)
BAG URINE DRAINAGE (UROLOGICAL SUPPLIES) IMPLANT
BAG URINE LEG 19OZ MD ST LTX (BAG) IMPLANT
BAG URINE LEG 500ML (DRAIN) IMPLANT
BAG URO CATCHER STRL LF (DRAPE) ×3 IMPLANT
CANISTER SUCT LVC 12 LTR MEDI- (MISCELLANEOUS) ×1 IMPLANT
CATH INTERMIT  6FR 70CM (CATHETERS) ×3 IMPLANT
CLOTH BEACON ORANGE TIMEOUT ST (SAFETY) ×3 IMPLANT
DRAPE CAMERA CLOSED 9X96 (DRAPES) ×3 IMPLANT
ELECT LOOP MED HF 24F 12D CBL (CLIP) ×1 IMPLANT
GLOVE BIO SURGEON STRL SZ7.5 (GLOVE) ×3 IMPLANT
GLOVE SURG SS PI 7.5 STRL IVOR (GLOVE) ×2 IMPLANT
GOWN STRL REUS W/ TWL XL LVL3 (GOWN DISPOSABLE) IMPLANT
GOWN STRL REUS W/TWL XL LVL3 (GOWN DISPOSABLE) ×6
IV NS IRRIG 3000ML ARTHROMATIC (IV SOLUTION) ×4 IMPLANT
NS IRRIG 500ML POUR BTL (IV SOLUTION) ×1 IMPLANT
PACK CYSTOSCOPY (CUSTOM PROCEDURE TRAY) ×3 IMPLANT
SET ASPIRATION TUBING (TUBING) IMPLANT
SYRINGE IRR TOOMEY STRL 70CC (SYRINGE) ×1 IMPLANT

## 2014-01-04 NOTE — Anesthesia Postprocedure Evaluation (Signed)
  Anesthesia Post-op Note  Patient: Rhonda Norman  Procedure(s) Performed: Procedure(s) (LRB): TRANSURETHRAL RESECTION OF BLADDER TUMOR WITH GYRUS (TURBT-GYRUS) (N/A) CYSTOSCOPY WITH bilateral RETROGRADE PYELOGRAM (Bilateral)  Patient Location: PACU  Anesthesia Type: General  Level of Consciousness: awake and alert   Airway and Oxygen Therapy: Patient Spontanous Breathing  Post-op Pain: mild  Post-op Assessment: Post-op Vital signs reviewed, Patient's Cardiovascular Status Stable, Respiratory Function Stable, Patent Airway and No signs of Nausea or vomiting  Last Vitals:  Filed Vitals:   01/04/14 1415  BP: 143/92  Pulse: 63  Temp:   Resp: 13    Post-op Vital Signs: stable   Complications: No apparent anesthesia complications

## 2014-01-04 NOTE — Brief Op Note (Signed)
01/04/2014  1:31 PM  PATIENT:  Rhonda Norman  80 y.o. female  PRE-OPERATIVE DIAGNOSIS:  BLADDER CANCER  POST-OPERATIVE DIAGNOSIS:  BLADDER CANCER  PROCEDURE:  Procedure(s): TRANSURETHRAL RESECTION OF BLADDER TUMOR WITH GYRUS (TURBT-GYRUS) (N/A) CYSTOSCOPY WITH bilateral RETROGRADE PYELOGRAM (Bilateral)  SURGEON:  Surgeon(s) and Role:    * [name], MD - Primary  PHYSICIAN ASSISTANT:   ASSISTANTS: none   ANESTHESIA:   general  EBL:  Total I/O In: 300 [I.V.:300] Out: -   BLOOD ADMINISTERED:none  DRAINS: none   LOCAL MEDICATIONS USED:  NONE  SPECIMEN:  Source of Specimen:  1 - bladder tumor, 2 - base of bladder tumor  DISPOSITION OF SPECIMEN:  PATHOLOGY  COUNTS:  YES  TOURNIQUET:  * No tourniquets in log *  DICTATION: .Other Dictation: Dictation Number [id number]  PLAN OF CARE: Discharge to home after PACU  PATIENT DISPOSITION:  PACU - hemodynamically stable.   Delay start of Pharmacological VTE agent (>24hrs) due to surgical blood loss or risk of bleeding: yes

## 2014-01-04 NOTE — Anesthesia Preprocedure Evaluation (Addendum)
Anesthesia Evaluation  Patient identified by MRN, date of birth, ID band Patient awake    Reviewed: Allergy & Precautions, H&P , NPO status   Airway Mallampati: II TM Distance: >3 FB Neck ROM: Full    Dental no notable dental hx. (+) Lower Dentures, Upper Dentures   Pulmonary neg pulmonary ROS,  breath sounds clear to auscultation  Pulmonary exam normal       Cardiovascular hypertension, Pt. on medications + CAD Rhythm:Regular Rate:Normal  06-01-12 EKG Sinus  Bradycardia  - occasional PAC    Neuro/Psych Anxiety Depression    GI/Hepatic GERD-  ,  Endo/Other  diabetes  Renal/GU      Musculoskeletal  (+) Arthritis -, Osteoarthritis,    Abdominal   Peds  Hematology   Anesthesia Other Findings   Reproductive/Obstetrics                      Anesthesia Physical Anesthesia Plan  ASA: II  Anesthesia Plan: General   Post-op Pain Management:    Induction: Intravenous  Airway Management Planned: LMA  Additional Equipment:   Intra-op Plan:   Post-operative Plan: Extubation in OR  Informed Consent: I have reviewed the patients History and Physical, chart, labs and discussed the procedure including the risks, benefits and alternatives for the proposed anesthesia with the patient or authorized representative who has indicated his/her understanding and acceptance.   Dental advisory given  Plan Discussed with: CRNA and Surgeon  Anesthesia Plan Comments:        Anesthesia Quick Evaluation

## 2014-01-04 NOTE — Discharge Instructions (Addendum)
1 - You may have urinary urgency (bladder spasms) and bloody urine on / off x 1-2 weeks. This is normal.  2 - Call MD or go to ER for fever >102, severe pain / nausea / vomiting not relieved by medications, or acute change in medical status  Post Anesthesia Home Care Instructions  Activity: Get plenty of rest for the remainder of the day. A responsible adult should stay with you for 24 hours following the procedure.  For the next 24 hours, DO NOT: -Drive a car -Paediatric nurse -Drink alcoholic beverages -Take any medication unless instructed by your physician -Make any legal decisions or sign important papers.  Meals: Start with liquid foods such as gelatin or soup. Progress to regular foods as tolerated. Avoid greasy, spicy, heavy foods. If nausea and/or vomiting occur, drink only clear liquids until the nausea and/or vomiting subsides. Call your physician if vomiting continues.  Special Instructions/Symptoms: Your throat may feel dry or sore from the anesthesia or the breathing tube placed in your throat during surgery. If this causes discomfort, gargle with warm salt water. The discomfort should disappear within 24 hours.   Transurethral Resection, Bladder Tumor A cancerous growth (tumor) can develop on the inside wall of the bladder. The bladder is the organ that holds urine. One way to remove the tumor is a procedure called a transurethral resection. The tumor is removed (resected) through the tube that carries urine from the bladder out of the body (urethra). No cuts (incisions) are made in the skin. Instead, the procedure is done through a thin telescope, called a resectoscope. Attached to it is a light and usually a tiny camera. The resectoscope is put into the urethra. In men, the urethra opens at the end of the penis. In women, it opens just above the vagina.  A transurethral resection is usually used to remove tumors that have not gotten too big or too deep.   This is usually a  safe procedure. Every procedure has risks, though. For a transurethral resection, they include:  Infection. Antibiotic medication would need to be taken.  Bleeding.  Light bleeding may last for several days after the procedure.  If bleeding continues or is heavy, the bladder may need rinsing. Or, a new catheter might be put in for awhile.  Sometimes bed rest is needed.  Urination problems.  Pain and burning can occur when urinating. This usually goes away in a few days.  Scarring from the procedure can block the flow of urine.  Bladder damage.  It can be punctured or torn during removal of the tumor. If this happens, a catheter might be needed for longer. Antibiotics would be taken while the bladder heals.  Urine can leak through the hole or tear into the abdomen. If this happens, surgery may be needed to repair the bladder. BEFORE THE PROCEDURE   A medical evaluation will be done. This may include:  A physical examination.  Urine test. This is to make sure you do not have a urinary tract infection.  Blood tests.   Talking with an anesthesiologist. This is the person who will be in charge of the medication (anesthesia) to keep you from feeling pain during the transurethral resection. You might be asleep during the procedure (general anesthesia) or numb from the waist down, but awake during the procedure (spinal anesthesia). Ask your surgeon what to expect.  The person who is having a transurethral resection needs to give what is called informed consent. This requires signing a  legal paper that gives permission for the procedure. To give informed consent:  You must understand how the procedure is done and why.  You must be told all the risks and benefits of the procedure.  You must sign the consent. Sometimes a legal guardian can do this.  Signing should be witnessed by a healthcare professional.  The day before the surgery, eat only a light dinner. Then, do not eat or  drink anything for at least 8 hours before the surgery. Ask your caregiver if it is OK to take any needed medicines with a sip of water.  Arrive at least an hour before the surgery or whenever your surgeon recommends. This will give you time to check in and fill out any needed paperwork.  tion:  You will be given a general anesthetic or spinal anesthesia.  The procedure:  Once you are asleep or numb from the waist down, your legs will be placed in stirrups.  The resectoscope will be passed through the urethra into the bladder.  Fluid will be passed through the resectoscope. This will fill the bladder with water.  The surgeon will examine the bladder through the scope. If the scope has a camera, it can take pictures from inside the bladder. They can be projected onto a TV screen.  The surgeon will use various tools to remove the tumor in small pieces. Sometimes a laser (a beam of light energy) is used. Other tools may use electric current.  A tube (catheter) will often be placed so that urine can drain into a bag outside the body. This process helps stop bleeding. This tube keeps blood clots from blocking the urethra.  The procedure usually takes 30 to 45 minutes. AFTER THE PROCEDURE   You will stay in a recovery area until the anesthesia has worn off. Your blood pressure and pulse will be checked every so often. Then you will be taken to a hospital room.  You may continue to get fluids through the IV for awhile.  Some pain is normal. The catheter might be uncomfortable. Pain is usually not severe. If it is, ask for pain medicine.  Your urine may look bloody after a transurethral resection. This is normal.  If bleeding is heavy, a hospital caregiver may rinse out the bladder (irrigation) through the catheter.  Once the urine is clear, the catheter will be taken out.  You will need to stay in the hospital until you can urinate on your own.   PROGNOSIS   Transurethral resection  is considered the best way to treat bladder tumors that are not too far along. For most people, the treatment is successful. Sometimes, though, more treatment is needed.  Bladder cancers can come back even after a successful procedure. Because of this, be sure to have a checkup with your caregiver every 3 to 6 months. If everything is OK for 3 years, you can reduce the checkups to once a year.   Document Released: 02/07/2009 Document Revised: 07/06/2011 Document Reviewed: 05/23/2013 [facility] Patient Information 2015 [place], [place]. This information is not intended to replace advice given to you by your health care provider. Make sure you discuss any questions you have with your health care provider.

## 2014-01-04 NOTE — H&P (Signed)
Rhonda Norman is an 80 y.o. female.    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 Non smoker. No dye/textile/solvent exposure.  2 - Gross Hematuria - Several episodes gross blood 2015. CT and Cysto with bladder cancer as per above.  3- Bilateral Non-Complex Renal Cysts - Lt>Rt non-complex renal cysts by contrast CT 11/2013. Largest 2.5cm left. No enhancing nodules or renal cortical issues.  PMH sig for benign hyst. No CV disesae. No strong blood thinners.  Today Rhonda Norman is seen to proceed with transurethral resection of bladder tumor for diagnistic and theraputic intent. NO interval fevers.   Past Medical History  Diagnosis Date  . Impaired glucose tolerance 06/12/2011  . CAD (coronary artery disease)     non obstructive, Dr Rhonda Norman, april 2010  . Hyperlipidemia   . Anxiety   . Depression   . Hypertension   . Anemia, iron deficiency     pt denies  . GERD (gastroesophageal reflux disease)   . Arthritis     DJD-right knee  . Type II or unspecified type diabetes mellitus without mention of complication, uncontrolled 06/02/2012    pt denies 12/28/13    Past Surgical History  Procedure Laterality Date  . Abdominal hysterectomy    . Eye surgery Bilateral     Family History  Problem Relation Age of Onset  . Cancer Mother   . Hypertension Father    Social History:  reports that she has never smoked. She does not have any smokeless tobacco history on file. She reports that she drinks alcohol. She reports that she does not use illicit drugs.  Allergies:  Allergies  Allergen Reactions  . Codeine   . Lipitor [Atorvastatin Calcium] Other (See Comments)    myalgia  . Penicillins     No prescriptions prior to admission    No results found for this or any previous visit (from the past 48 hour(s)). No results found.  Review of Systems   Constitutional: Negative.  Negative for fever and chills.  HENT: Negative.   Eyes: Negative.   Respiratory: Negative.   Cardiovascular: Negative.   Gastrointestinal: Negative.   Genitourinary: Positive for hematuria. Negative for flank pain.  Musculoskeletal: Negative.   Skin: Negative.   Neurological: Negative.   Endo/Heme/Allergies: Negative.   Psychiatric/Behavioral: Negative.     Height 5\' 6"  (1.676 m), weight 79.379 kg (175 lb). Physical Exam  Constitutional: She is oriented to person, place, and time. She appears well-developed.  HENT:  Head: Normocephalic.  Eyes: Pupils are equal, round, and reactive to light.  Neck: Normal range of motion.  Cardiovascular: Normal rate.   Respiratory: Effort normal.  GI: Soft.  Genitourinary:  No CVAT  Musculoskeletal: Normal range of motion.  Neurological: She is alert and oriented to person, place, and time.  Skin: Skin is warm.  Psychiatric: She has a normal mood and affect. Her behavior is normal. Judgment and thought content normal.     Assessment/Plan    1- Bladder Cancer- Needs operative cysto + resection for tissue diagnosis / staging / initial treatment. May need Rt ureteral stent peri-op given close to Rt ureteral orifice.  We rediscussed operative biopsy / transurethral resection as the best next step for diagnostic and therapeutic purposes with goals being to remove all visible cancer and obtain tissue for pathologic exam. We rediscussed that for some low-grade tumors, this may  be all the treatment required, but that for many other tumors such as high-grade lesions, further therapy including surgery and or chemotherapy may be warranted. We also reoutlined the fact that any bladder cancer diagnosis will require close follow-up with periodic upper and lower tract evaluation. We discussed risks including bleeding, infection, damage to kidney / ureter / bladder including bladder perforation which can typically managed with  prolonged foley catheterization. We rementioned anesthetic and other rare risks including DVT, PE, MI, and mortality. I also rementioned that adjunctive procedures such as ureteral stenting, retrograde pyelography, and ureteroscopy may be necessary to fully evaluate the urinary tract depending on intra-operative findings.   After answering all questions to the patient's satisfaction, they wish to proceed today as planned.  2 - Gross Hematuria - likely due to tumor above. No other worrisome etiologies on eval this year.   3- Bilateral Non-Complex Renal Cysts - no specific intervention or surveillance warranted, observe.   Rhonda Norman 01/04/2014, 7:54 AM

## 2014-01-04 NOTE — Anesthesia Procedure Notes (Signed)
Procedure Name: LMA Insertion Date/Time: 01/04/2014 12:43 PM Performed by: Wanita Chamberlain Pre-anesthesia Checklist: Patient identified, Timeout performed, Emergency Drugs available, Suction available and Patient being monitored Patient Re-evaluated:Patient Re-evaluated prior to inductionOxygen Delivery Method: Circle system utilized Preoxygenation: Pre-oxygenation with 100% oxygen Intubation Type: IV induction Ventilation: Mask ventilation without difficulty LMA: LMA inserted LMA Size: 4.0 Number of attempts: 2 Placement Confirmation: positive ETCO2 Tube secured with: Tape Dental Injury: Teeth and Oropharynx as per pre-operative assessment

## 2014-01-04 NOTE — Transfer of Care (Signed)
Immediate Anesthesia Transfer of Care Note  Patient: Rhonda Norman  Procedure(s) Performed: Procedure(s): TRANSURETHRAL RESECTION OF BLADDER TUMOR WITH GYRUS (TURBT-GYRUS) (N/A) CYSTOSCOPY WITH bilateral RETROGRADE PYELOGRAM (Bilateral)  Patient Location: PACU  Anesthesia Type:General  Level of Consciousness: awake, alert , oriented and patient cooperative  Airway & Oxygen Therapy: Patient Spontanous Breathing and Patient connected to nasal cannula oxygen  Post-op Assessment: Report given to PACU RN and Post -op Vital signs reviewed and stable  Post vital signs: Reviewed and stable  Complications: No apparent anesthesia complications

## 2014-01-05 NOTE — Op Note (Signed)
NAMEMarland Kitchen  DEONE, OMAHONEY NO.:  [id number]  MEDICAL RECORD NO.:  [id number]  LOCATION:                                 FACILITY:  PHYSICIAN:  [name], MD     DATE OF BIRTH:  09/25/1932  DATE OF PROCEDURE:  01/04/2014 DATE OF DISCHARGE:  01/04/2014                              OPERATIVE REPORT   DIAGNOSIS:  Bladder cancer.  PROCEDURES: 1. Transurethral resection of bladder tumor, volume large. 2. Bilateral retrograde pyelogram interpretation.  ESTIMATED BLOOD LOSS:  Less than 20 mL.  COMPLICATIONS:  None.  SPECIMEN: 1. Bladder tumor. 2. Base of bladder tumor.  FINDINGS: 1. Large papillary and nodular tumor extending from the right wall of     the bladder.  Superior lateral to the right ureteral orifice.     Total tumor surface area approximately 7 cm2. 2. Erythema around the right ureteral orifice without direct     involvement of tumor. It was felt that no need for ureteral     stenting on the right post resection. 3. Unremarkable bilateral retrograde pyelogram.  INDICATIONS:  Ms. Cordon is a pleasant 80 year old lady with recent history of gross hematuria who was found on workup of this to have a large volume of bladder cancer and this appeared to clinically localized.  CT scan without any obvious distant disease or pelvic lymphadenopathy.  Options were discussed including transection of bladder tumor for initial diagnostic and therapeutic maneuvers along with retrograde pyelogram, a possible right ureteral stenting, and she wished to proceed.  Informed consent was obtained and placed in medical record.  PROCEDURE IN DETAIL:  The patient being Audryana Hockenberry and procedure being transurethral resection of bladder tumor with retrograde pyelography was confirmed.  Procedure was carried out.  Time-out was performed.  Intravenous antibiotics administered.  General LMA anesthesia was introduced.  The patient was placed into a low  lithotomy position.  Sterile field was created by prepping and draping the patient's vagina, introitus, and proximal thighs using iodine x3.  Next, cystourethroscopy was performed using using a 22-French rigid cystoscope, 12-degree offset lens.  Inspection of the anterior and posterior urethra revealed a very large volume of the bladder tumor that appeared to be nodular and sessile in some parts, however, papillary in others with obvious necrosis in some areas; the total volume is estimated to be approximately 7 cm2.  Ureteral orifice was inferior medial to this, however, the right ureteral orifice was somewhat erythematous worrisome for possible involvement of in situ process but no frank tumor in this location.  Left ureteral orifice was unremarkable. Attention was directed to bilateral regrade pyelograms. First, the left ureteral orifice was cannulated with 6-French end-hole catheter and left retrograde pyelogram was obtained.  Left pyelogram demonstrated a single left ureter with single system left kidney.  No filling defects or narrowing noted.  Similarly right retrograde pyelogram was obtained.  Right retrograde pyelogram demonstrates a single right ureter, single system right kidney.  No filling defects or narrowing noted.  Next, the cystoscope was exchanged for a 26-French [facility] continuous flow resectoscope sheath and using bipolar energy loop, very careful systematic resection was performed of the bladder tumor down to what appeared to  be very close to flat with the bladder wall.  At this time, succinylcholine was given to prevent obturator reflex and additional resection was performed down what appeared to be the seromuscular stroma of the urinary bladder, keeping well away from the area of the right ureteral orifice.  These tumor fragments were set aside for pathology labeled bladder tumor.  Next, cold cup biopsy were performed of the seromuscular layer deep to the obvious  visible bladder tumor and these set aside labeled base of bladder tumor.  Additional coagulation current was applied at final biopsy sites with the loop electrode.  Following these maneuvers, hemostasis appeared excellent.  There was no evidence of bladder perforation.  All visible tumor had been resected. Additional coagulation current was applied  approximately 2 cm beyond the area of previous visible tumor taking great care to avoid any injury to the ureteral orifice and this did not occur. Photodocumentation was performed pre and post resection. There was efflux of urine noted to be persistent from bilateral orifices following resection, felt that stenting was not warranted again.  As such, bladder was emptied per cystoscope and procedure terminated.  The patient tolerated the procedure well.  There were no immediate periprocedural complications.  The patient was taken to postanesthesia care unit in stable condition.          ______________________________ [name], MD     TM/MEDQ  D:  01/04/2014  T:  01/05/2014  Job:  [id number]

## 2014-01-08 ENCOUNTER — Encounter (HOSPITAL_BASED_OUTPATIENT_CLINIC_OR_DEPARTMENT_OTHER): Payer: Self-pay | Admitting: Urology

## 2014-01-22 ENCOUNTER — Other Ambulatory Visit: Payer: Self-pay | Admitting: Urology

## 2014-01-23 ENCOUNTER — Other Ambulatory Visit: Payer: Self-pay | Admitting: Urology

## 2014-02-15 ENCOUNTER — Encounter (HOSPITAL_BASED_OUTPATIENT_CLINIC_OR_DEPARTMENT_OTHER): Payer: Self-pay | Admitting: *Deleted

## 2014-02-16 ENCOUNTER — Encounter (HOSPITAL_BASED_OUTPATIENT_CLINIC_OR_DEPARTMENT_OTHER): Payer: Self-pay | Admitting: *Deleted

## 2014-02-16 NOTE — Progress Notes (Signed)
NPO AFTER MN. ARRIVE AT 0700. NEEDS ISTAT 8 AND EKG.  WILL TAKE AVAPRO AND NORVASC AM DOS W/ SIPS OF WATER.

## 2014-02-21 ENCOUNTER — Encounter (HOSPITAL_BASED_OUTPATIENT_CLINIC_OR_DEPARTMENT_OTHER): Payer: Self-pay | Admitting: *Deleted

## 2014-02-21 ENCOUNTER — Encounter (HOSPITAL_BASED_OUTPATIENT_CLINIC_OR_DEPARTMENT_OTHER): Payer: Medicare Other | Admitting: Certified Registered"

## 2014-02-21 ENCOUNTER — Encounter (HOSPITAL_BASED_OUTPATIENT_CLINIC_OR_DEPARTMENT_OTHER)
Admission: RE | Disposition: A | Discharge status: Home / Self Care | Payer: Medicare Other | Source: Ambulatory Visit | Attending: Urology

## 2014-02-21 ENCOUNTER — Ambulatory Visit (HOSPITAL_BASED_OUTPATIENT_CLINIC_OR_DEPARTMENT_OTHER): Payer: Medicare Other | Admitting: Certified Registered"

## 2014-02-21 ENCOUNTER — Other Ambulatory Visit: Payer: Self-pay

## 2014-02-21 ENCOUNTER — Ambulatory Visit (HOSPITAL_BASED_OUTPATIENT_CLINIC_OR_DEPARTMENT_OTHER)
Admission: RE | Admit: 2014-02-21 | Discharge: 2014-02-21 | Disposition: A | Discharge status: Home / Self Care | Payer: Medicare Other | Source: Ambulatory Visit | Attending: Urology | Admitting: Urology

## 2014-02-21 DIAGNOSIS — I10 Essential (primary) hypertension: Secondary | ICD-10-CM | POA: Diagnosis not present

## 2014-02-21 DIAGNOSIS — Z888 Allergy status to other drugs, medicaments and biological substances status: Secondary | ICD-10-CM | POA: Diagnosis not present

## 2014-02-21 DIAGNOSIS — E785 Hyperlipidemia, unspecified: Secondary | ICD-10-CM | POA: Diagnosis not present

## 2014-02-21 DIAGNOSIS — Z880 Allergy status to penicillin: Secondary | ICD-10-CM | POA: Diagnosis not present

## 2014-02-21 DIAGNOSIS — F419 Anxiety disorder, unspecified: Secondary | ICD-10-CM | POA: Diagnosis not present

## 2014-02-21 DIAGNOSIS — Z885 Allergy status to narcotic agent status: Secondary | ICD-10-CM | POA: Diagnosis not present

## 2014-02-21 DIAGNOSIS — D509 Iron deficiency anemia, unspecified: Secondary | ICD-10-CM | POA: Insufficient documentation

## 2014-02-21 DIAGNOSIS — I2510 Atherosclerotic heart disease of native coronary artery without angina pectoris: Secondary | ICD-10-CM | POA: Diagnosis not present

## 2014-02-21 DIAGNOSIS — K219 Gastro-esophageal reflux disease without esophagitis: Secondary | ICD-10-CM | POA: Diagnosis not present

## 2014-02-21 DIAGNOSIS — F329 Major depressive disorder, single episode, unspecified: Secondary | ICD-10-CM | POA: Insufficient documentation

## 2014-02-21 DIAGNOSIS — N281 Cyst of kidney, acquired: Secondary | ICD-10-CM | POA: Insufficient documentation

## 2014-02-21 DIAGNOSIS — C679 Malignant neoplasm of bladder, unspecified: Secondary | ICD-10-CM | POA: Insufficient documentation

## 2014-02-21 DIAGNOSIS — E119 Type 2 diabetes mellitus without complications: Secondary | ICD-10-CM | POA: Diagnosis not present

## 2014-02-21 HISTORY — PX: TRANSURETHRAL RESECTION OF BLADDER TUMOR WITH GYRUS (TURBT-GYRUS): SHX6458

## 2014-02-21 HISTORY — DX: Personal history of diseases of the blood and blood-forming organs and certain disorders involving the immune mechanism: Z86.2

## 2014-02-21 HISTORY — PX: CYSTOSCOPY W/ RETROGRADES: SHX1426

## 2014-02-21 HISTORY — DX: Malignant neoplasm of bladder, unspecified: C67.9

## 2014-02-21 LAB — GLUCOSE, CAPILLARY: Glucose-Capillary: 139 mg/dL — ABNORMAL HIGH (ref 70–99)

## 2014-02-21 LAB — POCT I-STAT, CHEM 8
BUN: 19 mg/dL (ref 6–23)
Calcium, Ion: 1.27 mmol/L (ref 1.13–1.30)
Chloride: 107 mEq/L (ref 96–112)
Creatinine, Ser: 0.8 mg/dL (ref 0.50–1.10)
GLUCOSE: 120 mg/dL — AB (ref 70–99)
HCT: 43 % (ref 36.0–46.0)
HEMOGLOBIN: 14.6 g/dL (ref 12.0–15.0)
Potassium: 4.1 mEq/L (ref 3.7–5.3)
Sodium: 143 mEq/L (ref 137–147)
TCO2: 22 mmol/L (ref 0–100)

## 2014-02-21 SURGERY — TRANSURETHRAL RESECTION OF BLADDER TUMOR WITH GYRUS (TURBT-GYRUS)
Anesthesia: General | Site: Ureter

## 2014-02-21 MED ORDER — HYDROMORPHONE HCL 1 MG/ML IJ SOLN
0.2500 mg | INTRAMUSCULAR | Status: DC | PRN
  Filled 2014-02-21: qty 1

## 2014-02-21 MED ORDER — PROPOFOL 10 MG/ML IV BOLUS
INTRAVENOUS | Status: DC | PRN
  Administered 2014-02-21: 140 mg via INTRAVENOUS

## 2014-02-21 MED ORDER — GENTAMICIN IN SALINE 1.6-0.9 MG/ML-% IV SOLN
80.0000 mg | INTRAVENOUS | Status: DC
  Filled 2014-02-21: qty 50

## 2014-02-21 MED ORDER — EPHEDRINE SULFATE 50 MG/ML IJ SOLN
INTRAMUSCULAR | Status: DC | PRN
  Administered 2014-02-21 (×2): 10 mg via INTRAVENOUS

## 2014-02-21 MED ORDER — ONDANSETRON HCL 4 MG/2ML IJ SOLN
INTRAMUSCULAR | Status: DC | PRN
  Administered 2014-02-21: 4 mg via INTRAVENOUS

## 2014-02-21 MED ORDER — FENTANYL CITRATE 0.05 MG/ML IJ SOLN
INTRAMUSCULAR | Status: DC | PRN
  Administered 2014-02-21: 50 ug via INTRAVENOUS
  Administered 2014-02-21: 25 ug via INTRAVENOUS

## 2014-02-21 MED ORDER — OXYCODONE HCL 5 MG/5ML PO SOLN
5.0000 mg | Freq: Once | ORAL | Status: DC | PRN
  Filled 2014-02-21: qty 5

## 2014-02-21 MED ORDER — PROMETHAZINE HCL 25 MG/ML IJ SOLN
6.2500 mg | INTRAMUSCULAR | Status: DC | PRN
  Filled 2014-02-21: qty 1

## 2014-02-21 MED ORDER — SODIUM CHLORIDE 0.9 % IR SOLN
Status: DC | PRN
  Administered 2014-02-21: 3000 mL via INTRAVESICAL

## 2014-02-21 MED ORDER — FENTANYL CITRATE 0.05 MG/ML IJ SOLN
INTRAMUSCULAR | Status: AC
  Filled 2014-02-21: qty 4

## 2014-02-21 MED ORDER — MITOMYCIN CHEMO FOR BLADDER INSTILLATION 40 MG
40.0000 mg | Freq: Once | INTRAVENOUS | Status: DC
Start: 2014-02-21 — End: 2014-02-21

## 2014-02-21 MED ORDER — TRAMADOL HCL 50 MG PO TABS: 50.0000 mg | ORAL_TABLET | Freq: Four times a day (QID) | ORAL | Status: DC | PRN

## 2014-02-21 MED ORDER — MEPERIDINE HCL 25 MG/ML IJ SOLN
6.2500 mg | INTRAMUSCULAR | Status: DC | PRN
  Filled 2014-02-21: qty 1

## 2014-02-21 MED ORDER — LACTATED RINGERS IV SOLN
INTRAVENOUS | Status: DC
  Administered 2014-02-21: 08:00:00 via INTRAVENOUS
  Filled 2014-02-21: qty 1000

## 2014-02-21 MED ORDER — OXYCODONE HCL 5 MG PO TABS
5.0000 mg | ORAL_TABLET | Freq: Once | ORAL | Status: DC | PRN
  Filled 2014-02-21: qty 1

## 2014-02-21 MED ORDER — LIDOCAINE HCL (CARDIAC) 20 MG/ML IV SOLN
INTRAVENOUS | Status: DC | PRN
  Administered 2014-02-21: 50 mg via INTRAVENOUS

## 2014-02-21 MED ORDER — SULFAMETHOXAZOLE-TMP DS 800-160 MG PO TABS: 1.0000 | ORAL_TABLET | Freq: Every day | ORAL | Status: DC

## 2014-02-21 MED ORDER — DEXTROSE 5 % IV SOLN
340.0000 mg | Freq: Once | INTRAVENOUS | Status: AC
  Administered 2014-02-21: 340 mg via INTRAVENOUS
  Filled 2014-02-21: qty 8.5

## 2014-02-21 SURGICAL SUPPLY — 31 items
BAG DRN ANRFLXCHMBR STRAP LEK (BAG) ×2
BAG URINE DRAINAGE (UROLOGICAL SUPPLIES) IMPLANT
BAG URINE LEG 19OZ MD ST LTX (BAG) ×1 IMPLANT
BAG URINE LEG 500ML (DRAIN) IMPLANT
BAG URO CATCHER STRL LF (DRAPE) ×3 IMPLANT
BASKET ZERO TIP NITINOL 2.4FR (BASKET) IMPLANT
BSKT STON RTRVL ZERO TP 2.4FR (BASKET)
CATH FOLEY 2WAY SLVR  5CC 22FR (CATHETERS)
CATH FOLEY 2WAY SLVR 30CC 20FR (CATHETERS) IMPLANT
CATH FOLEY 2WAY SLVR 5CC 22FR (CATHETERS) IMPLANT
CATH FOLEY 3WAY  5CC 16FR (CATHETERS) ×1
CATH FOLEY 3WAY 5CC 16FR (CATHETERS) IMPLANT
CATH INTERMIT  6FR 70CM (CATHETERS) ×1 IMPLANT
CLOTH BEACON ORANGE TIMEOUT ST (SAFETY) ×3 IMPLANT
DRAPE CAMERA CLOSED 9X96 (DRAPES) ×3 IMPLANT
ELECT LOOP MED HF 24F 12D CBL (CLIP) ×1 IMPLANT
ELECT REM PT RETURN 9FT ADLT (ELECTROSURGICAL) ×3
ELECT RESECT VAPORIZE 12D CBL (ELECTRODE) IMPLANT
ELECTRODE REM PT RTRN 9FT ADLT (ELECTROSURGICAL) ×2 IMPLANT
EVACUATOR MICROVAS BLADDER (UROLOGICAL SUPPLIES) IMPLANT
GLOVE BIO SURGEON STRL SZ7.5 (GLOVE) ×3 IMPLANT
GOWN PREVENTION PLUS XLARGE (GOWN DISPOSABLE) ×3 IMPLANT
GOWN STRL NON-REIN LRG LVL3 (GOWN DISPOSABLE) ×4 IMPLANT
GOWN STRL REUS W/ TWL XL LVL3 (GOWN DISPOSABLE) IMPLANT
GOWN STRL REUS W/TWL XL LVL3 (GOWN DISPOSABLE) ×6
GUIDEWIRE ANG ZIPWIRE 038X150 (WIRE) IMPLANT
GUIDEWIRE STR DUAL SENSOR (WIRE) ×3 IMPLANT
IV NS IRRIG 3000ML ARTHROMATIC (IV SOLUTION) ×4 IMPLANT
PACK CYSTO (CUSTOM PROCEDURE TRAY) ×3 IMPLANT
SET ASPIRATION TUBING (TUBING) IMPLANT
SYRINGE IRR TOOMEY STRL 70CC (SYRINGE) IMPLANT

## 2014-02-21 NOTE — Op Note (Signed)
NAME:  Rhonda Norman, GRUMBINE NO.:  MEDICAL RECORD NO.:  [id number]  LOCATION:                                 FACILITY:  PHYSICIAN:  [name], MD          DATE OF BIRTH:  DATE OF PROCEDURE: 02/21/2014  DATE OF DISCHARGE:                              OPERATIVE REPORT   DIAGNOSIS:  History of large volume high-grade bladder cancer.  PROCEDURE: 1. Transurethral resection of bladder tumor, volume large restaging. 2. Bilateral retrograde pyelogram, interpretation.  ESTIMATED BLOOD LOSS:  Nil.  COMPLICATIONS:  None.  SPECIMENS: 1. Old resection site. 2. Base of old resection site.  FINDINGS: 1. Large amount of fibrinous material overlying old resection site     consistent with healing. 2. No obvious or gross recurrence, progression of bladder tumor. 3. Unremarkable bilateral retrograde pyelograms. 4. Successful deep resection of tumor.  The bladder is quite thin     posteriorly such that instillation of mitomycin would not be     advisable and a Foley catheter left in place to allow interval     bladder healing before allowing to refill. 5. Old resection site area approximately 5.5 cm2.  INDICATION:  [name] is a pleasant 80 year old lady with a history of high-grade and a stage I bladder cancer that was large volume.  This was found on workup of gross hematuria.  This is very close to her right ureteral orifice but not involving directly.  She underwent transurethral resection about 6 weeks ago which corroborated T1G3 disease given this finding, the options were discussed including bladder sparing versus not and she wished proceed with bladder sparing protocol with beginning with restaging resection for which she presents today. Informed consent was obtained and placed in medical record.  PROCEDURE IN DETAIL:  The patient being Rhonda Norman was verified. Procedure being restaging, transurethral resection of the bladder tumor were  confirmed.  Procedure was carried out.  Time-out was performed. Intravenous antibiotics administered.  General LMA anesthesia was introduced.  The patient was placed into a low lithotomy position. Sterile field was created by prepping and draping the patient's vagina, introitus, and proximal thighs using iodine x3.  Next, cystourethroscopy was performed using a 22-French rigid scope with 12-degree offset lens. Inspection of the urinary bladder revealed old resection site extending from the inter-trigone area superiorly to the right wall not directly involving the right ureteral orifice coming approximately 1.5 cm away from this.  There was significant fibrinous debris over this as expected.  This was irrigated and set aside for discard.  Attention was then directed to the retrograde pyelograms on the left side.  The left ureteral orifice was cannulated with a 6-French end-hole catheter and left retrograde pyelogram was obtained.  Left pyelogram demonstrated a single left ureter, single system left kidney.  No filling defects or narrowing noted.  Attention was then directed to the resection of the old resection site using a 24-French resectoscope sheath and medium size bipolar resection loop.  Very careful systematic resection was performed of the old site down what appeared to be the fibromuscular stroma of the bladder.  These were set aside, labeled old  resection site, total surface area approximately 5.5 cm2.  Great care was taken to avoid any direct injury to the ureteral orifices which did not occur.  Separate cold cup biopsy forceps were used to take representative seromuscular bites from the deep margin x2. These were set aside for permanent pathology.  Attention was then directed at right retrograde pyelogram.  Again, using cystoscope and a 6- French end-hole catheter, the right ureter was cannulated and right retrograde pyelogram was obtained.  Right retrograde pyelogram  demonstrated a single right ureter, single system right kidney.  No filling defects or narrowing noted.  Inspection of the bladder following this revealed excellent hemostasis.  There were no large perforations noted.  The bladder did appear somewhat in the area of old resection and it was felt that instillation of mitomycin would not be advisable and we elected to place a Foley catheter to allow some interval bladder healing before refilling as such a 16-French, 3- way Foley catheter was placed per urethra to straight drains.  The irrigation port was plugged.  The 10 mL of sterile water placed in the balloon.  Efflux from the catheter appeared completely clear.  Procedure was then terminated.  The patient tolerated the procedure well.  There were no immediate periprocedural complications.  The patient was taken to the postanesthesia care unit in stable condition.          ______________________________ [name], MD     TM/MEDQ  D:  02/21/2014  T:  02/21/2014  Job:  [id number]

## 2014-02-21 NOTE — Discharge Instructions (Signed)
1 - You may have urinary urgency (bladder spasms) and bloody urine on / off with catheter in place. This is normal.  2 - Call MD or go to ER for fever >102, severe pain / nausea / vomiting not relieved by medications, or acute change in medical status CYSTOSCOPY HOME CARE INSTRUCTIONS  Activity: Rest for the remainder of the day.  Do not drive or operate equipment today.  You may resume normal activities in one to two days as instructed by your physician.   Meals: Drink plenty of liquids and eat light foods such as gelatin or soup this evening.  You may return to a normal meal plan tomorrow.  Return to Work: You may return to work in one to two days or as instructed by your physician.  Special Instructions / Symptoms: Call your physician if any of these symptoms occur:   -persistent or heavy bleeding  -bleeding which continues after first few urination  -large blood clots that are difficult to pass  -urine stream diminishes or stops completely  -fever equal to or higher than 101 degrees Farenheit.  -cloudy urine with a strong, foul odor  -severe pain  Females should always wipe from front to back after elimination.  You may feel some burning pain when you urinate.  This should disappear with time.  Applying moist heat to the lower abdomen or a hot tub bath may help relieve the pain. \  Follow-Up / Date of Return Visit to Your Physician:  *** Call for an appointment to arrange follow-up.  Patient Signature:  ________________________________________________________  Nurse's Signature:  ________________________________________________________  Post Anesthesia Home Care Instructions  Activity: Get plenty of rest for the remainder of the day. A responsible adult should stay with you for 24 hours following the procedure.  For the next 24 hours, DO NOT: -Drive a car -Paediatric nurse -Drink alcoholic beverages -Take any medication unless instructed by your physician -Make any  legal decisions or sign important papers.  Meals: Start with liquid foods such as gelatin or soup. Progress to regular foods as tolerated. Avoid greasy, spicy, heavy foods. If nausea and/or vomiting occur, drink only clear liquids until the nausea and/or vomiting subsides. Call your physician if vomiting continues.  Special Instructions/Symptoms: Your throat may feel dry or sore from the anesthesia or the breathing tube placed in your throat during surgery. If this causes discomfort, gargle with warm salt water. The discomfort should disappear within 24 hours.  Foley Catheter Care A Foley catheter is a soft, flexible tube. This tube is placed into your bladder to drain pee (urine). If you go home with this catheter in place, follow the instructions below. TAKING CARE OF THE CATHETER 1. Wash your hands with soap and water. 2. Put soap and water on a clean washcloth.  Clean the skin where the tube goes into your body.  Clean away from the tube site.  Never wipe toward the tube.  Clean the area using a circular motion.  Remove all the soap. Pat the area dry with a clean towel. For males, reposition the skin that covers the end of the penis (foreskin). 3. Attach the tube to your leg with tape or a leg strap. Do not stretch the tube tight. If you are using tape, remove any stickiness left behind by past tape you used. 4. Keep the drainage bag below your hips. Keep it off the floor. 5. Check your tube during the day. Make sure it is working and draining. Make sure  the tube does not curl, twist, or bend. 6. Do not pull on the tube or try to take it out. TAKING CARE OF THE DRAINAGE BAGS You will have a large overnight drainage bag and a small leg bag. You may wear the overnight bag any time. Never wear the small bag at night. Follow the directions below. Emptying the Drainage Bag Empty your drainage bag when it is  - full or at least 2-3 times a day. 1. Wash your hands with soap and  water. 2. Keep the drainage bag below your hips. 3. Hold the dirty bag over the toilet or clean container. 4. Open the pour spout at the bottom of the bag. Empty the pee into the toilet or container. Do not let the pour spout touch anything. 5. Clean the pour spout with a gauze pad or cotton ball that has rubbing alcohol on it. 6. Close the pour spout. 7. Attach the bag to your leg with tape or a leg strap. 8. Wash your hands well. Changing the Drainage Bag Change your bag once a month or sooner if it starts to smell or look dirty.  1. Wash your hands with soap and water. 2. Pinch the rubber tube so that pee does not spill out. 3. Disconnect the catheter tube from the drainage tube at the connection valve. Do not let the tubes touch anything. 4. Clean the end of the catheter tube with an alcohol wipe. Clean the end of a the drainage tube with a different alcohol wipe. 5. Connect the catheter tube to the drainage tube of the clean drainage bag. 6. Attach the new bag to the leg with tape or a leg strap. Avoid attaching the new bag too tightly. 7. Wash your hands well. Cleaning the Drainage Bag 1. Wash your hands with soap and water. 2. Wash the bag in warm, soapy water. 3. Rinse the bag with warm water. 4. Fill the bag with a mixture of white vinegar and water (1 cup vinegar to 1 quart warm water [.2 liter vinegar to 1 liter warm water]). Close the bag and soak it for 30 minutes in the solution. 5. Rinse the bag with warm water. 6. Hang the bag to dry with the pour spout open and hanging downward. 7. Store the clean bag (once it is dry) in a clean plastic bag. 8. Wash your hands well. PREVENT INFECTION  Wash your hands before and after touching your tube.  Take showers every day. Wash the skin where the tube enters your body. Do not take baths. Replace wet leg straps with dry ones, if this applies.  Do not use powders, sprays, or lotions on the genital area. Only use creams, lotions, or  ointments as told by your doctor.  For females, wipe from front to back after going to the bathroom.  Drink enough fluids to keep your pee clear or pale yellow unless you are told not to have too much fluid (fluid restriction).  Do not let the drainage bag or tubing touch or lie on the floor.  Wear cotton underwear to keep the area dry. GET HELP IF:  Your pee is cloudy or smells unusually bad.  Your tube becomes clogged.  You are not draining pee into the bag or your bladder feels full.  Your tube starts to leak. GET HELP RIGHT AWAY IF:  You have pain, puffiness (swelling), redness, or yellowish-white fluid (pus) where the tube enters the body.  You have pain in the belly (  abdomen), legs, lower back, or bladder.  You have a fever.  You see blood fill the tube, or your pee is pink or red.  You feel sick to your stomach (nauseous), throw up (vomit), or have chills.  Your tube gets pulled out. MAKE SURE YOU:   Understand these instructions.  Will watch your condition.  Will get help right away if you are not doing well or get worse. Document Released: 08/08/2012 Document Revised: 08/28/2013 Document Reviewed: 08/08/2012 [facility] Patient Information 2015 [place], [place]. This information is not intended to replace advice given to you by your health care provider. Make sure you discuss any questions you have with your health care provider.

## 2014-02-21 NOTE — H&P (Signed)
Rhonda Norman is an 80 y.o. female.    Chief Complaint: Pre-OP Transurethral Resection of Bladder Tumor  HPI:   1- High Grade Superficial Bladder Cancer- initially found on hematuria eval 11/2013. Non smoker. No dye/textile/solvent exposure. 12/2013 TaG3 (large volume 7cm Rt wall) by TURBT.  2 - Gross Hematuria - Several episodes gross blood 2015. CT and Cysto with bladder cancer as per above.  3- Bilateral Non-Complex Renal Cysts - Lt>Rt non-complex renal cysts by contrast CT 11/2013. Largest 2.5cm left. No enhancing nodules or renal cortical issues.  PMH sig for benign hyst. No CV disease. No strong blood thinners. Her PCP is [name] with [name].   Today [name] is seen to proceed with restaging transurethral resection of bladder tumor.   Past Medical History  Diagnosis Date  . Hyperlipidemia   . Anxiety   . Depression   . Hypertension   . GERD (gastroesophageal reflux disease)   . Arthritis     DJD-right knee  . CAD (coronary artery disease)     non obstructive, [name], april 2010  . History of iron deficiency anemia   . Bladder cancer     high grade superficial bladder cancer--  monitored by dr [name]  . Bilateral renal cysts     non-complex  . Type 2 diabetes mellitus     per pcp note Dr Cathlean Cower--  currently not taking meds and pt denies DX    Past Surgical History  Procedure Laterality Date  . Transurethral resection of bladder tumor with gyrus (turbt-gyrus) N/A 01/04/2014    Procedure: TRANSURETHRAL RESECTION OF BLADDER TUMOR WITH GYRUS (TURBT-GYRUS);  Surgeon: [name], MD;  Location: [facility];  Service: Urology;  Laterality: N/A;  . Cystoscopy w/ ureteral stent placement Bilateral 01/04/2014    Procedure: CYSTOSCOPY WITH bilateral RETROGRADE PYELOGRAM;  Surgeon: [name], MD;  Location: [facility];  Service: Urology;  Laterality: Bilateral;  . Cardiovascular stress test  08-15-2008  dr Cathie Olden    abnormal  stress dual isotope/  BP drop following exercise is concern/ no evidence ischenmia/ LV function normal/ ef 74%  . Cardiac catheterization  08-22-2008  dr Cathie Olden    mild to moderate coronary artery irregularies/  dRCA 30%/  ef 60%/  possible coronary spasm  . Abdominal hysterectomy  1970's    w/ bilateral salpingoophorectomy  . Cataract extraction w/ intraocular lens  implant, bilateral  2013    Family History  Problem Relation Age of Onset  . Cancer Mother   . Hypertension Father    Social History:  reports that she has never smoked. She does not have any smokeless tobacco history on file. She reports that she drinks alcohol. She reports that she does not use illicit drugs.  Allergies:  Allergies  Allergen Reactions  . Codeine Nausea And Vomiting  . Lipitor [Atorvastatin Calcium] Other (See Comments)    myalgia  . Penicillins Other (See Comments)    Unknown childhood reaction    No prescriptions prior to admission    No results found for this or any previous visit (from the past 48 hour(s)). No results found.  Review of Systems  Constitutional: Negative.  Negative for fever and chills.  HENT: Negative.   Eyes: Negative.   Respiratory: Negative.   Cardiovascular: Negative.   Gastrointestinal: Negative.  Negative for nausea and vomiting.  Genitourinary: Positive for hematuria. Negative for flank pain.  Musculoskeletal: Negative.   Skin: Negative.   Neurological: Negative.  Endo/Heme/Allergies: Negative.     Height 5\' 6"  (1.676 m), weight 78.926 kg (174 lb). Physical Exam  Constitutional: She is oriented to person, place, and time. She appears well-developed and well-nourished.  HENT:  Right Ear: External ear normal.  Eyes: EOM are normal. Pupils are equal, round, and reactive to light.  Neck: Normal range of motion. Neck supple.  Cardiovascular: Normal rate.   Respiratory: Effort normal.  GI: Soft.  Genitourinary:  No CVAT  Musculoskeletal: Normal range of  motion.  Neurological: She is alert and oriented to person, place, and time.  Skin: Skin is warm and dry.  Psychiatric: She has a normal mood and affect. Her behavior is normal. Judgment and thought content normal.     Assessment/Plan  1- High Grade Superficial Bladder Cancer- Fortunately no invasion by initial path. Given high grade and large volume restaging resection with mitomycin instillation certainly warranted.   We rediscussed operative biopsy / transurethral resection as the best next step for diagnostic and therapeutic purposes with goals being to remove all visible cancer and obtain tissue for pathologic exam. We discussed that for some low-grade tumors, this may be all the treatment required, but that for many other tumors such as high-grade lesions, further therapy including surgery and or chemotherapy may be warranted. We also reoutlined the fact that any bladder cancer diagnosis will require close follow-up with periodic upper and lower tract evaluation. We discussed risks including bleeding, infection, damage to kidney / ureter / bladder including bladder perforation which can typically managed with prolonged foley catheterization. We mentioned anesthetic and other rare risks including DVT, PE, MI, and mortality. I also mentioned that adjunctive procedures such as ureteral stenting, retrograde pyelography, and ureteroscopy may be necessary to fully evaluate the urinary tract depending on intra-operative findings.   After answering all questions to the patient's satisfaction, they wish to proceed today as planned.   2 - Gross Hematuria - likely due to tumor above. No other worrisome etiologies on eval this year.   3- Bilateral Non-Complex Renal Cysts - no specific intervention or surveillance warranted, observe.   [name] 02/21/2014, 6:26 AM

## 2014-02-21 NOTE — Brief Op Note (Signed)
02/21/2014  9:16 AM  PATIENT:  Rhonda Norman  80 y.o. female  PRE-OPERATIVE DIAGNOSIS:  HIGH GRADE BLADDER CANCER  POST-OPERATIVE DIAGNOSIS:  HIGH GRADE BLADDER CANCER  PROCEDURE:  Transurethral resection of bladder tumor (large), bilateral retrograde pyelograms with interpretation  SURGEON:  Surgeon(s) and Role:    * [name], MD - Primary  PHYSICIAN ASSISTANT:   ASSISTANTS: Curt Bears MD  ANESTHESIA:   none  EBL:  Total I/O In: 200 [I.V.:200] Out: -   BLOOD ADMINISTERED:none  DRAINS: foley to straight drain   LOCAL MEDICATIONS USED:  NONE  SPECIMEN:  Source of Specimen:  old resection site, base of old resection site  DISPOSITION OF SPECIMEN:  PATHOLOGY  COUNTS:  YES  TOURNIQUET:  * No tourniquets in log *  DICTATION: .Other Dictation: Dictation Number [id number]  PLAN OF CARE: Discharge to home after PACU  PATIENT DISPOSITION:  PACU - hemodynamically stable.   Delay start of Pharmacological VTE agent (>24hrs) due to surgical blood loss or risk of bleeding: yes

## 2014-02-21 NOTE — Transfer of Care (Signed)
Immediate Anesthesia Transfer of Care Note  Patient: Rhonda Norman  Procedure(s) Performed: Procedure(s) (LRB): TRANSURETHRAL RESECTION OF BLADDER TUMOR WITH GYRUS (TURBT-GYRUS) (N/A) CYSTOSCOPY WITH RETROGRADE PYELOGRAM, Bilateral  (Bilateral)  Patient Location: PACU  Anesthesia Type: General  Level of Consciousness: awake, oriented, sedated and patient cooperative  Airway & Oxygen Therapy: Patient Spontanous Breathing and Patient connected to face mask oxygen  Post-op Assessment: Report given to PACU RN and Post -op Vital signs reviewed and stable  Post vital signs: Reviewed and stable  Complications: No apparent anesthesia complications

## 2014-02-21 NOTE — Anesthesia Preprocedure Evaluation (Signed)
Anesthesia Evaluation  Patient identified by MRN, date of birth, ID band Patient awake    Reviewed: Allergy & Precautions, H&P , NPO status , Patient's Chart, lab work & pertinent test results  Airway Mallampati: II  TM Distance: >3 FB Neck ROM: Full    Dental no notable dental hx.    Pulmonary neg pulmonary ROS,  breath sounds clear to auscultation  Pulmonary exam normal       Cardiovascular hypertension, Pt. on medications + CAD Rhythm:Regular Rate:Normal     Neuro/Psych PSYCHIATRIC DISORDERS Anxiety Depression negative neurological ROS     GI/Hepatic Neg liver ROS, GERD-  Medicated,  Endo/Other  diabetes, Type 2  Renal/GU Renal disease     Musculoskeletal  (+) Arthritis -,   Abdominal   Peds  Hematology negative hematology ROS (+) anemia ,   Anesthesia Other Findings   Reproductive/Obstetrics negative OB ROS                             Anesthesia Physical Anesthesia Plan  ASA: III  Anesthesia Plan: General   Post-op Pain Management:    Induction: Intravenous  Airway Management Planned: LMA  Additional Equipment:   Intra-op Plan:   Post-operative Plan: Extubation in OR  Informed Consent: I have reviewed the patients History and Physical, chart, labs and discussed the procedure including the risks, benefits and alternatives for the proposed anesthesia with the patient or authorized representative who has indicated his/her understanding and acceptance.   Dental advisory given  Plan Discussed with: CRNA  Anesthesia Plan Comments:         Anesthesia Quick Evaluation

## 2014-02-21 NOTE — Anesthesia Postprocedure Evaluation (Signed)
Anesthesia Post Note  Patient: Rhonda Norman  Procedure(s) Performed: Procedure(s) (LRB): TRANSURETHRAL RESECTION OF BLADDER TUMOR WITH GYRUS (TURBT-GYRUS) (N/A) CYSTOSCOPY WITH RETROGRADE PYELOGRAM, Bilateral  (Bilateral)  Anesthesia type: General  Patient location: PACU  Post pain: Pain level controlled  Post assessment: Post-op Vital signs reviewed  Last Vitals: BP 145/81  Pulse 62  Temp(Src) 36.4 C (Oral)  Resp 16  Ht 5\' 6"  (1.676 m)  Wt 174 lb 8 oz (79.153 kg)  BMI 28.18 kg/m2  SpO2 98%  Post vital signs: Reviewed  Level of consciousness: sedated  Complications: No apparent anesthesia complications

## 2014-02-21 NOTE — Anesthesia Procedure Notes (Signed)
Procedure Name: LMA Insertion Date/Time: 02/21/2014 8:31 AM Performed by: Denna Haggard D Pre-anesthesia Checklist: Patient identified, Emergency Drugs available, Suction available and Patient being monitored Patient Re-evaluated:Patient Re-evaluated prior to inductionOxygen Delivery Method: Circle System Utilized Preoxygenation: Pre-oxygenation with 100% oxygen Intubation Type: IV induction Ventilation: Mask ventilation without difficulty LMA: LMA inserted LMA Size: 4.0 Number of attempts: 1 Airway Equipment and Method: bite block Placement Confirmation: positive ETCO2 Tube secured with: Tape Dental Injury: Teeth and Oropharynx as per pre-operative assessment

## 2014-02-22 ENCOUNTER — Encounter (HOSPITAL_BASED_OUTPATIENT_CLINIC_OR_DEPARTMENT_OTHER): Payer: Self-pay | Admitting: Urology

## 2014-06-06 ENCOUNTER — Encounter: Payer: Self-pay | Admitting: Internal Medicine

## 2014-06-06 ENCOUNTER — Other Ambulatory Visit (INDEPENDENT_AMBULATORY_CARE_PROVIDER_SITE_OTHER): Payer: Medicare Other

## 2014-06-06 ENCOUNTER — Ambulatory Visit (INDEPENDENT_AMBULATORY_CARE_PROVIDER_SITE_OTHER): Payer: Medicare Other | Admitting: Internal Medicine

## 2014-06-06 VITALS — BP 150/86 | HR 86 | Temp 98.1°F | Ht 66.0 in | Wt 174.0 lb

## 2014-06-06 DIAGNOSIS — Z0000 Encounter for general adult medical examination without abnormal findings: Secondary | ICD-10-CM

## 2014-06-06 DIAGNOSIS — E119 Type 2 diabetes mellitus without complications: Secondary | ICD-10-CM

## 2014-06-06 DIAGNOSIS — I10 Essential (primary) hypertension: Secondary | ICD-10-CM

## 2014-06-06 DIAGNOSIS — E785 Hyperlipidemia, unspecified: Secondary | ICD-10-CM

## 2014-06-06 LAB — BASIC METABOLIC PANEL
BUN: 25 mg/dL — AB (ref 6–23)
CALCIUM: 10 mg/dL (ref 8.4–10.5)
CO2: 28 meq/L (ref 19–32)
CREATININE: 0.95 mg/dL (ref 0.40–1.20)
Chloride: 104 mEq/L (ref 96–112)
GFR: 60.2 mL/min (ref >60.00)
Glucose, Bld: 102 mg/dL — ABNORMAL HIGH (ref 70–99)
Potassium: 4 mEq/L (ref 3.5–5.1)
Sodium: 139 mEq/L (ref 135–145)

## 2014-06-06 LAB — LIPID PANEL
CHOL/HDL RATIO: 3
Cholesterol: 189 mg/dL (ref 0–200)
HDL: 62.2 mg/dL (ref >39.00)
LDL Cholesterol: 93 mg/dL (ref 0–99)
NonHDL: 126.8
TRIGLYCERIDES: 169 mg/dL — AB (ref 0.0–149.0)
VLDL: 33.8 mg/dL (ref 0.0–40.0)

## 2014-06-06 LAB — CBC WITH DIFFERENTIAL/PLATELET
BASOS PCT: 0.8 % (ref 0.0–3.0)
Basophils Absolute: 0.1 10*3/uL (ref 0.0–0.1)
EOS PCT: 1.6 % (ref 0.0–5.0)
Eosinophils Absolute: 0.1 10*3/uL (ref 0.0–0.7)
HEMATOCRIT: 39.4 % (ref 36.0–46.0)
Hemoglobin: 13.4 g/dL (ref 12.0–15.0)
Lymphocytes Relative: 31.3 % (ref 12.0–46.0)
Lymphs Abs: 2 10*3/uL (ref 0.7–4.0)
MCHC: 34.1 g/dL (ref 30.0–36.0)
MCV: 87 fl (ref 78.0–100.0)
MONOS PCT: 7.9 % (ref 3.0–12.0)
Monocytes Absolute: 0.5 10*3/uL (ref 0.1–1.0)
Neutro Abs: 3.6 10*3/uL (ref 1.4–7.7)
Neutrophils Relative %: 58.4 % (ref 43.0–77.0)
PLATELETS: 195 10*3/uL (ref 150.0–400.0)
RBC: 4.53 Mil/uL (ref 3.87–5.11)
RDW: 13.8 % (ref 11.5–15.5)
WBC: 6.2 10*3/uL (ref 4.0–10.5)

## 2014-06-06 LAB — HEPATIC FUNCTION PANEL
ALK PHOS: 92 U/L (ref 39–117)
ALT: 13 U/L (ref 0–35)
AST: 17 U/L (ref 0–37)
Albumin: 4.3 g/dL (ref 3.5–5.2)
Bilirubin, Direct: 0 mg/dL (ref 0.0–0.3)
TOTAL PROTEIN: 7.1 g/dL (ref 6.0–8.3)
Total Bilirubin: 0.5 mg/dL (ref 0.2–1.2)

## 2014-06-06 LAB — HEMOGLOBIN A1C: Hgb A1c MFr Bld: 6.3 % (ref 4.6–6.5)

## 2014-06-06 LAB — TSH: TSH: 2.76 u[IU]/mL (ref 0.35–4.50)

## 2014-06-06 MED ORDER — ATORVASTATIN CALCIUM 10 MG PO TABS: 10.0000 mg | ORAL_TABLET | Freq: Every day | ORAL | Status: DC

## 2014-06-06 MED ORDER — ASPIRIN EC 81 MG PO TBEC: 81.0000 mg | DELAYED_RELEASE_TABLET | Freq: Every day | ORAL | Status: DC

## 2014-06-06 NOTE — Assessment & Plan Note (Signed)
BP at home < 104/90 per pt, stable overall by history and exam, recent data reviewed with pt, and pt to continue medical treatment as before,  to f/u any worsening symptoms or concerns BP Readings from Last 3 Encounters:  06/06/14 150/86  02/21/14 145/81  01/04/14 139/89

## 2014-06-06 NOTE — Assessment & Plan Note (Signed)
stable overall by history and exam, recent data reviewed with pt, and pt to continue medical treatment as before,  to f/u any worsening symptoms or concerns Lab Results  Component Value Date   HGBA1C 6.6* 06/29/2013   For fu lab

## 2014-06-06 NOTE — Patient Instructions (Addendum)
OK to stop the lovastatin  Please take all new medication as prescribed - the lipitor at 10 mg per day  Please continue all other medications as before, and refills have been done if requested.  Please have the pharmacy call with any other refills you may need.  Please continue your efforts at being more active, low cholesterol diet, and weight control.  You are otherwise up to date with prevention measures today.  Please keep your appointments with your specialists as you may have planned  Please go to the LAB in the Basement (turn left off the elevator) for the tests to be done today  You will be contacted by phone if any changes need to be made immediately.  Otherwise, you will receive a letter about your results with an explanation, but please check with MyChart first.  Please remember to sign up for MyChart if you have not done so, as this will be important to you in the future with finding out test results, communicating by private email, and scheduling acute appointments online when needed.  Please return in 1 year for your yearly visit, or sooner if needed, with Lab testing done 3-5 days before

## 2014-06-06 NOTE — Progress Notes (Signed)
Subjective:    Patient ID: Rhonda Norman, female    DOB: [birth year], 81 y.o.   MRN: [id number]  HPI  Here for wellness and f/u;  Overall doing ok;  Pt denies CP, worsening SOB, DOE, wheezing, orthopnea, PND, worsening LE edema, palpitations, dizziness or syncope.  Pt denies neurological change such as new headache, facial or extremity weakness.  Pt denies polydipsia, polyuria, or low sugar symptoms. Pt states overall good compliance with treatment and medications, good tolerability, and has been trying to follow lower cholesterol diet.  Pt denies worsening depressive symptoms, suicidal ideation or panic. No fever, night sweats, wt loss, loss of appetite, or other constitutional symptoms.  Pt states good ability with ADL's, has low fall risk, home safety reviewed and adequate, no other significant changes in hearing or vision, and quite active with exercise.  Taking balance class 4 days per wk. Had recent bladder ca, not eager to get mammogram at this time. Pt wants to re-try lipitor as lovastatin not working   seroquel working well for sleep.  BP at home < 140.90 Past Medical History  Diagnosis Date  . Hyperlipidemia   . Anxiety   . Depression   . Hypertension   . GERD (gastroesophageal reflux disease)   . Arthritis     DJD-right knee  . CAD (coronary artery disease)     non obstructive, [name], april 2010  . History of iron deficiency anemia   . Bladder cancer     high grade superficial bladder cancer--  monitored by dr [name]  . Bilateral renal cysts     non-complex  . Type 2 diabetes mellitus     per pcp note Dr Cathlean Cower--  currently not taking meds and pt denies DX   Past Surgical History  Procedure Laterality Date  . Transurethral resection of bladder tumor with gyrus (turbt-gyrus) N/A 01/04/2014    Procedure: TRANSURETHRAL RESECTION OF BLADDER TUMOR WITH GYRUS (TURBT-GYRUS);  Surgeon: [name], MD;  Location: [facility];  Service: Urology;   Laterality: N/A;  . Cystoscopy w/ ureteral stent placement Bilateral 01/04/2014    Procedure: CYSTOSCOPY WITH bilateral RETROGRADE PYELOGRAM;  Surgeon: [name], MD;  Location: [facility];  Service: Urology;  Laterality: Bilateral;  . Cardiovascular stress test  08-15-2008  dr Cathie Olden    abnormal stress dual isotope/  BP drop following exercise is concern/ no evidence ischenmia/ LV function normal/ ef 74%  . Cardiac catheterization  08-22-2008  dr Cathie Olden    mild to moderate coronary artery irregularies/  dRCA 30%/  ef 60%/  possible coronary spasm  . Abdominal hysterectomy  1970's    w/ bilateral salpingoophorectomy  . Cataract extraction w/ intraocular lens  implant, bilateral  2013  . Transurethral resection of bladder tumor with gyrus (turbt-gyrus) N/A 02/21/2014    Procedure: TRANSURETHRAL RESECTION OF BLADDER TUMOR WITH GYRUS (TURBT-GYRUS);  Surgeon: [name], MD;  Location: [facility];  Service: Urology;  Laterality: N/A;  . Cystoscopy w/ retrogrades Bilateral 02/21/2014    Procedure: CYSTOSCOPY WITH RETROGRADE PYELOGRAM, Bilateral ;  Surgeon: [name], MD;  Location: [facility];  Service: Urology;  Laterality: Bilateral;    reports that she has never smoked. She does not have any smokeless tobacco history on file. She reports that she drinks alcohol. She reports that she does not use illicit drugs. family history includes Cancer in her mother; Hypertension in her father. Allergies  Allergen Reactions  . Codeine Nausea  And Vomiting  . Penicillins Other (See Comments)    Unknown childhood reaction   Current Outpatient Prescriptions on File Prior to Visit  Medication Sig Dispense Refill  . amLODipine (NORVASC) 5 MG tablet Take 5 mg by mouth every morning.    . Cholecalciferol (VITAMIN D3) 1000 UNITS CAPS Take 1 capsule by mouth.    . fish oil-omega-3 fatty acids 1000 MG capsule Take 2 g by mouth daily.    . irbesartan  (AVAPRO) 300 MG tablet Take 300 mg by mouth every morning.    Marland Kitchen QUEtiapine (SEROQUEL) 50 MG tablet Take 1 tablet (50 mg total) by mouth at bedtime. As needed 90 tablet 1   No current facility-administered medications on file prior to visit.    Review of Systems Constitutional: Negative for increased diaphoresis, other activity, appetite or other siginficant weight change  HENT: Negative for worsening hearing loss, ear pain, facial swelling, mouth sores and neck stiffness.   Eyes: Negative for other worsening pain, redness or visual disturbance.  Respiratory: Negative for shortness of breath and wheezing.   Cardiovascular: Negative for chest pain and palpitations.  Gastrointestinal: Negative for diarrhea, blood in stool, abdominal distention or other pain Genitourinary: Negative for hematuria, flank pain or change in urine volume.  Musculoskeletal: Negative for myalgias or other joint complaints.  Skin: Negative for color change and wound.  Neurological: Negative for syncope and numbness. other than noted Hematological: Negative for adenopathy. or other swelling Psychiatric/Behavioral: Negative for hallucinations, self-injury, decreased concentration or other worsening agitation.      Objective:   Physical Exam BP 150/86 mmHg  Pulse 86  Temp(Src) 98.1 F (36.7 C) (Oral)  Ht 5\' 6"  (1.676 m)  Wt 174 lb (78.926 kg)  BMI 28.10 kg/m2 VS noted,  Constitutional: Pt is oriented to person, place, and time. Appears well-developed and well-nourished.  Head: Normocephalic and atraumatic.  Right Ear: External ear normal.  Left Ear: External ear normal.  Nose: Nose normal.  Mouth/Throat: Oropharynx is clear and moist.  Eyes: Conjunctivae and EOM are normal. Pupils are equal, round, and reactive to light.  Neck: Normal range of motion. Neck supple. No JVD present. No tracheal deviation present.  Cardiovascular: Normal rate, regular rhythm, normal heart sounds and intact distal pulses.     Pulmonary/Chest: Effort normal and breath sounds without rales or wheezing  Abdominal: Soft. Bowel sounds are normal. NT. No HSM  Musculoskeletal: Normal range of motion. Exhibits no edema.  Lymphadenopathy:  Has no cervical adenopathy.  Neurological: Pt is alert and oriented to person, place, and time. Pt has normal reflexes. No cranial nerve deficit. Motor grossly intact Skin: Skin is warm and dry. No rash noted.  Psychiatric:  Has normal mood and affect. Behavior is normal.     Assessment & Plan:

## 2014-06-06 NOTE — Assessment & Plan Note (Signed)

## 2014-06-06 NOTE — Progress Notes (Signed)
Pre visit review using our clinic review tool, if applicable. No additional management support is needed unless otherwise documented below in the visit note. 

## 2014-06-06 NOTE — Assessment & Plan Note (Signed)
lovsastatin not working well last yr, pt would like to re-try lipitor, to let us know if myalgias recur, also start asa 39

## 2014-06-07 ENCOUNTER — Other Ambulatory Visit: Payer: Self-pay | Admitting: Internal Medicine

## 2014-06-08 ENCOUNTER — Other Ambulatory Visit: Payer: Self-pay | Admitting: *Deleted

## 2014-06-08 ENCOUNTER — Telehealth: Payer: Self-pay | Admitting: Internal Medicine

## 2014-06-08 MED ORDER — IRBESARTAN 300 MG PO TABS: 300.0000 mg | ORAL_TABLET | Freq: Every morning | ORAL | Status: DC

## 2014-06-08 MED ORDER — QUETIAPINE FUMARATE 50 MG PO TABS: 50.0000 mg | ORAL_TABLET | Freq: Every day | ORAL | Status: DC

## 2014-06-08 MED ORDER — AMLODIPINE BESYLATE 5 MG PO TABS: 5.0000 mg | ORAL_TABLET | Freq: Every morning | ORAL | Status: DC

## 2014-06-08 NOTE — Telephone Encounter (Signed)
Pt states she saw md on Wednesday he was suppose to send refills on meds, pharmacy only received lipitor and aspirin. Inform pt refills for BP meds was not sent will send to [facility]...Rhonda Norman

## 2014-06-08 NOTE — Telephone Encounter (Signed)
emmi mailed  °

## 2014-12-10 ENCOUNTER — Other Ambulatory Visit: Payer: Self-pay | Admitting: Internal Medicine

## 2015-02-12 ENCOUNTER — Encounter: Payer: Self-pay | Admitting: Internal Medicine

## 2015-02-12 ENCOUNTER — Ambulatory Visit (INDEPENDENT_AMBULATORY_CARE_PROVIDER_SITE_OTHER): Payer: Medicare Other | Admitting: Internal Medicine

## 2015-02-12 VITALS — BP 138/72 | HR 93 | Temp 97.7°F | Wt 173.2 lb

## 2015-02-12 DIAGNOSIS — C679 Malignant neoplasm of bladder, unspecified: Secondary | ICD-10-CM | POA: Insufficient documentation

## 2015-02-12 DIAGNOSIS — M545 Low back pain: Secondary | ICD-10-CM

## 2015-02-12 DIAGNOSIS — E119 Type 2 diabetes mellitus without complications: Secondary | ICD-10-CM

## 2015-02-12 DIAGNOSIS — E785 Hyperlipidemia, unspecified: Secondary | ICD-10-CM | POA: Diagnosis not present

## 2015-02-12 DIAGNOSIS — M549 Dorsalgia, unspecified: Secondary | ICD-10-CM | POA: Insufficient documentation

## 2015-02-12 DIAGNOSIS — I10 Essential (primary) hypertension: Secondary | ICD-10-CM

## 2015-02-12 DIAGNOSIS — Z0189 Encounter for other specified special examinations: Secondary | ICD-10-CM

## 2015-02-12 DIAGNOSIS — Z0000 Encounter for general adult medical examination without abnormal findings: Secondary | ICD-10-CM

## 2015-02-12 MED ORDER — TRAMADOL HCL 50 MG PO TABS: 50.0000 mg | ORAL_TABLET | Freq: Four times a day (QID) | ORAL | Status: DC | PRN

## 2015-02-12 MED ORDER — CYCLOBENZAPRINE HCL 5 MG PO TABS: 5.0000 mg | ORAL_TABLET | Freq: Three times a day (TID) | ORAL | Status: DC | PRN

## 2015-02-12 NOTE — Patient Instructions (Signed)
Please take all new medication as prescribed  - the pain medication, and muscle relaxer  Please continue all other medications as before, and refills have been done if requested.  Please have the pharmacy call with any other refills you may need.  Please continue your efforts at being more active, low cholesterol diet, and weight control.  Please keep your appointments with your specialists as you may have planned  Please return in 3 months, or sooner if needed, with Lab testing done 3-5 days before

## 2015-02-12 NOTE — Assessment & Plan Note (Signed)
C/w msk overuse, for flexeril, tramadol prn, avoid overuse,  to f/u any worsening symptoms or concerns

## 2015-02-12 NOTE — Assessment & Plan Note (Signed)
stable overall by history and exam, recent data reviewed with pt, and pt to continue medical treatment as before,  to f/u any worsening symptoms or concerns BP Readings from Last 3 Encounters:  02/12/15 138/72  06/06/14 150/86  02/21/14 145/81

## 2015-02-12 NOTE — Assessment & Plan Note (Signed)
stable overall by history and exam, recent data reviewed with pt, and pt to continue medical treatment as before,  to f/u any worsening symptoms or concerns Lab Results  Component Value Date   HGBA1C 6.3 06/06/2014   For lab f/u

## 2015-02-12 NOTE — Assessment & Plan Note (Signed)
stable overall by history and exam, recent data reviewed with pt, and pt to continue medical treatment as before,  to f/u any worsening symptoms or concerns Lab Results  Component Value Date   LDLCALC 93 06/06/2014

## 2015-02-12 NOTE — Progress Notes (Signed)
Subjective:    Patient ID: Rhonda Norman, female    DOB: [birth year], 81 y.o.   MRN: [id number]  HPI  Here to f/u; overall doing ok,  Pt denies chest pain, increasing sob or doe, wheezing, orthopnea, PND, increased LE swelling, palpitations, dizziness or syncope.  Pt denies new neurological symptoms such as new headache, or facial or extremity weakness or numbness.  Pt denies polydipsia, polyuria, or low sugar episode.   Pt denies new neurological symptoms such as new headache, or facial or extremity weakness or numbness.   Pt states overall good compliance with meds, mostly trying to follow appropriate diet, with wt overall stable,  but little exercise however. 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 Pt continues to have recurring LBP without change in severity, bowel or bladder change, fever, wt loss,  worsening LE pain/numbness/weakness, gait change or falls.   Past Medical History  Diagnosis Date  . Hyperlipidemia   . Anxiety   . Depression   . Hypertension   . GERD (gastroesophageal reflux disease)   . Arthritis     DJD-right knee  . CAD (coronary artery disease)     non obstructive, [name], april 2010  . History of iron deficiency anemia   . Bladder cancer ([place])     high grade superficial bladder cancer--  monitored by dr [name]  . Bilateral renal cysts     non-complex  . Type 2 diabetes mellitus (HCC)     per pcp note Dr Cathlean Cower--  currently not taking meds and pt denies DX   Past Surgical History  Procedure Laterality Date  . Transurethral resection of bladder tumor with gyrus (turbt-gyrus) N/A 01/04/2014    Procedure: TRANSURETHRAL RESECTION OF BLADDER TUMOR WITH GYRUS (TURBT-GYRUS);  Surgeon: [name], MD;  Location: [facility];  Service: Urology;  Laterality: N/A;  . Cystoscopy w/ ureteral stent placement Bilateral 01/04/2014    Procedure: CYSTOSCOPY WITH bilateral RETROGRADE PYELOGRAM;  Surgeon: [name], MD;   Location: [facility];  Service: Urology;  Laterality: Bilateral;  . Cardiovascular stress test  08-15-2008  dr Cathie Olden    abnormal stress dual isotope/  BP drop following exercise is concern/ no evidence ischenmia/ LV function normal/ ef 74%  . Cardiac catheterization  08-22-2008  dr Cathie Olden    mild to moderate coronary artery irregularies/  dRCA 30%/  ef 60%/  possible coronary spasm  . Abdominal hysterectomy  1970's    w/ bilateral salpingoophorectomy  . Cataract extraction w/ intraocular lens  implant, bilateral  2013  . Transurethral resection of bladder tumor with gyrus (turbt-gyrus) N/A 02/21/2014    Procedure: TRANSURETHRAL RESECTION OF BLADDER TUMOR WITH GYRUS (TURBT-GYRUS);  Surgeon: [name], MD;  Location: [facility];  Service: Urology;  Laterality: N/A;  . Cystoscopy w/ retrogrades Bilateral 02/21/2014    Procedure: CYSTOSCOPY WITH RETROGRADE PYELOGRAM, Bilateral ;  Surgeon: [name], MD;  Location: [facility];  Service: Urology;  Laterality: Bilateral;    reports that she has never smoked. She does not have any smokeless tobacco history on file. She reports that she drinks alcohol. She reports that she does not use illicit drugs. family history includes Cancer in her mother; Hypertension in her father. Allergies  Allergen Reactions  . Codeine Nausea And Vomiting  . Penicillins Other (See Comments)    Unknown childhood reaction   Current Outpatient Prescriptions on File Prior to Visit  Medication Sig Dispense  Refill  . amLODipine (NORVASC) 5 MG tablet Take 1 tablet (5 mg total) by mouth every morning. 90 tablet 3  . aspirin EC 81 MG tablet Take 1 tablet (81 mg total) by mouth daily. 90 tablet 11  . atorvastatin (LIPITOR) 10 MG tablet Take 1 tablet (10 mg total) by mouth daily. 90 tablet 3  . Cholecalciferol (VITAMIN D3) 1000 UNITS CAPS Take 1 capsule by mouth.    . fish oil-omega-3 fatty acids 1000 MG capsule Take 2 g by  mouth daily.    . irbesartan (AVAPRO) 300 MG tablet TAKE ONE TABLET BY MOUTH ONCE DAILY 90 tablet 3  . irbesartan (AVAPRO) 300 MG tablet Take 1 tablet (300 mg total) by mouth every morning. 90 tablet 3  . QUEtiapine (SEROQUEL) 50 MG tablet TAKE ONE TABLET BY MOUTH AT BEDTIME AS NEEDED 90 tablet 0   No current facility-administered medications on file prior to visit.   Review of Systems  Constitutional: Negative for unusual diaphoresis or night sweats HENT: Negative for ringing in ear or discharge Eyes: Negative for double vision or worsening visual disturbance.  Respiratory: Negative for choking and stridor.   Gastrointestinal: Negative for vomiting or other signifcant bowel change Genitourinary: Negative for hematuria or change in urine volume.  Musculoskeletal: Negative for other MSK pain or swelling Skin: Negative for color change and worsening wound.  Neurological: Negative for tremors and numbness other than noted  Psychiatric/Behavioral: Negative for decreased concentration or agitation other than above       Objective:   Physical Exam BP 138/72 mmHg  Pulse 93  Temp(Src) 97.7 F (36.5 C)  Wt 173 lb 4 oz (78.586 kg)  SpO2 99% VS noted,  Constitutional: Pt appears in no significant distress HENT: Head: NCAT.  Right Ear: External ear normal.  Left Ear: External ear normal.  Eyes: . Pupils are equal, round, and reactive to light. Conjunctivae and EOM are normal Neck: Normal range of motion. Neck supple.  Cardiovascular: Normal rate and regular rhythm.   Pulmonary/Chest: Effort normal and breath sounds without rales or wheezing.  Abd:  Soft, NT, ND, + BS Spine nontender, has some mild bilat lower paravertebral tender spasm Neurological: Pt is alert. Not confused , motor grossly intact Skin: Skin is warm. No rash, no LE edema Psychiatric: Pt behavior is normal. No agitation.     Assessment & Plan:

## 2015-02-12 NOTE — Progress Notes (Signed)
Pre visit review using our clinic review tool, if applicable. No additional management support is needed unless otherwise documented below in the visit note. 

## 2015-03-08 ENCOUNTER — Other Ambulatory Visit: Payer: Self-pay

## 2015-03-08 MED ORDER — QUETIAPINE FUMARATE 50 MG PO TABS: 50.0000 mg | ORAL_TABLET | Freq: Every evening | ORAL | Status: DC | PRN

## 2015-06-04 ENCOUNTER — Other Ambulatory Visit: Payer: Self-pay | Admitting: Internal Medicine

## 2015-07-11 ENCOUNTER — Other Ambulatory Visit (INDEPENDENT_AMBULATORY_CARE_PROVIDER_SITE_OTHER): Payer: Medicare Other

## 2015-07-11 ENCOUNTER — Encounter: Payer: Self-pay | Admitting: Internal Medicine

## 2015-07-11 ENCOUNTER — Ambulatory Visit (INDEPENDENT_AMBULATORY_CARE_PROVIDER_SITE_OTHER): Payer: Medicare Other | Admitting: Internal Medicine

## 2015-07-11 VITALS — BP 132/80 | HR 90 | Temp 98.3°F | Resp 20 | Wt 177.0 lb

## 2015-07-11 DIAGNOSIS — E119 Type 2 diabetes mellitus without complications: Secondary | ICD-10-CM

## 2015-07-11 DIAGNOSIS — Z0000 Encounter for general adult medical examination without abnormal findings: Secondary | ICD-10-CM

## 2015-07-11 DIAGNOSIS — E785 Hyperlipidemia, unspecified: Secondary | ICD-10-CM | POA: Diagnosis not present

## 2015-07-11 DIAGNOSIS — I471 Supraventricular tachycardia: Secondary | ICD-10-CM | POA: Diagnosis not present

## 2015-07-11 DIAGNOSIS — I10 Essential (primary) hypertension: Secondary | ICD-10-CM

## 2015-07-11 LAB — LIPID PANEL
Cholesterol: 193 mg/dL (ref 0–200)
HDL: 62.2 mg/dL (ref >39.00)
LDL Cholesterol: 105 mg/dL — ABNORMAL HIGH (ref 0–99)
NonHDL: 131.24
TRIGLYCERIDES: 132 mg/dL (ref 0.0–149.0)
Total CHOL/HDL Ratio: 3
VLDL: 26.4 mg/dL (ref 0.0–40.0)

## 2015-07-11 LAB — HEPATIC FUNCTION PANEL
ALK PHOS: 74 U/L (ref 39–117)
ALT: 15 U/L (ref 0–35)
AST: 18 U/L (ref 0–37)
Albumin: 4.3 g/dL (ref 3.5–5.2)
BILIRUBIN DIRECT: 0.1 mg/dL (ref 0.0–0.3)
BILIRUBIN TOTAL: 0.3 mg/dL (ref 0.2–1.2)
Total Protein: 7.2 g/dL (ref 6.0–8.3)

## 2015-07-11 LAB — BASIC METABOLIC PANEL
BUN: 28 mg/dL — AB (ref 6–23)
CALCIUM: 9.5 mg/dL (ref 8.4–10.5)
CHLORIDE: 105 meq/L (ref 96–112)
CO2: 27 meq/L (ref 19–32)
CREATININE: 0.95 mg/dL (ref 0.40–1.20)
GFR: 60.03 mL/min (ref >60.00)
GLUCOSE: 116 mg/dL — AB (ref 70–99)
Potassium: 3.9 mEq/L (ref 3.5–5.1)
Sodium: 140 mEq/L (ref 135–145)

## 2015-07-11 LAB — CBC WITH DIFFERENTIAL/PLATELET
BASOS PCT: 0.8 % (ref 0.0–3.0)
Basophils Absolute: 0 10*3/uL (ref 0.0–0.1)
EOS PCT: 2.1 % (ref 0.0–5.0)
Eosinophils Absolute: 0.1 10*3/uL (ref 0.0–0.7)
HCT: 41.1 % (ref 36.0–46.0)
Hemoglobin: 13.6 g/dL (ref 12.0–15.0)
LYMPHS ABS: 2.3 10*3/uL (ref 0.7–4.0)
Lymphocytes Relative: 36.8 % (ref 12.0–46.0)
MCHC: 33.3 g/dL (ref 30.0–36.0)
MCV: 89.4 fl (ref 78.0–100.0)
MONO ABS: 0.6 10*3/uL (ref 0.1–1.0)
MONOS PCT: 9 % (ref 3.0–12.0)
NEUTROS ABS: 3.2 10*3/uL (ref 1.4–7.7)
NEUTROS PCT: 51.3 % (ref 43.0–77.0)
Platelets: 214 10*3/uL (ref 150.0–400.0)
RBC: 4.59 Mil/uL (ref 3.87–5.11)
RDW: 13.5 % (ref 11.5–15.5)
WBC: 6.3 10*3/uL (ref 4.0–10.5)

## 2015-07-11 LAB — HEMOGLOBIN A1C: HEMOGLOBIN A1C: 6.3 % (ref 4.6–6.5)

## 2015-07-11 LAB — TSH: TSH: 2.4 u[IU]/mL (ref 0.35–4.50)

## 2015-07-11 MED ORDER — TRAMADOL HCL (ER BIPHASIC) 100 MG PO CP24: ORAL_CAPSULE | ORAL | Status: DC

## 2015-07-11 NOTE — Progress Notes (Signed)
Subjective:    Patient ID: Rhonda Norman, female    DOB: 08/05/1932, 82 y.o.   MRN: [id number]  HPI    Here for wellness and f/u;  Overall doing ok;  Pt denies Chest pain, worsening SOB, DOE, wheezing, orthopnea, PND, worsening LE edema, palpitations, dizziness or syncope.  Pt denies neurological change such as new headache, facial or extremity weakness.  Pt denies polydipsia, polyuria, or low sugar symptoms. Pt states overall good compliance with treatment and medications, good tolerability, and has been trying to follow appropriate diet.  Pt denies worsening depressive symptoms, suicidal ideation or panic. No fever, night sweats, wt loss, loss of appetite, or other constitutional symptoms.  Pt states good ability with ADL's, has low fall risk, home safety reviewed and adequate, no other significant changes in hearing or vision, and only occasionally active with exercise - goes to senior center in [place] with some exercise classes.  Declines immunizations today.  Has chronic bilat shoulder pain with crackling and DJD,  Past Medical History  Diagnosis Date  . Hyperlipidemia   . Anxiety   . Depression   . Hypertension   . GERD (gastroesophageal reflux disease)   . Arthritis     DJD-right knee  . CAD (coronary artery disease)     non obstructive, [name], april 2010  . History of iron deficiency anemia   . Bladder cancer ([place])     high grade superficial bladder cancer--  monitored by dr [name]  . Bilateral renal cysts     non-complex  . Type 2 diabetes mellitus (HCC)     per pcp note Dr Cathlean Cower--  currently not taking meds and pt denies DX   Past Surgical History  Procedure Laterality Date  . Transurethral resection of bladder tumor with gyrus (turbt-gyrus) N/A 01/04/2014    Procedure: TRANSURETHRAL RESECTION OF BLADDER TUMOR WITH GYRUS (TURBT-GYRUS);  Surgeon: [name], MD;  Location: [facility];  Service: Urology;  Laterality: N/A;  . Cystoscopy w/ ureteral  stent placement Bilateral 01/04/2014    Procedure: CYSTOSCOPY WITH bilateral RETROGRADE PYELOGRAM;  Surgeon: [name], MD;  Location: [facility];  Service: Urology;  Laterality: Bilateral;  . Cardiovascular stress test  08-15-2008  dr Cathie Olden    abnormal stress dual isotope/  BP drop following exercise is concern/ no evidence ischenmia/ LV function normal/ ef 74%  . Cardiac catheterization  08-22-2008  dr Cathie Olden    mild to moderate coronary artery irregularies/  dRCA 30%/  ef 60%/  possible coronary spasm  . Abdominal hysterectomy  1970's    w/ bilateral salpingoophorectomy  . Cataract extraction w/ intraocular lens  implant, bilateral  2013  . Transurethral resection of bladder tumor with gyrus (turbt-gyrus) N/A 02/21/2014    Procedure: TRANSURETHRAL RESECTION OF BLADDER TUMOR WITH GYRUS (TURBT-GYRUS);  Surgeon: [name], MD;  Location: [facility];  Service: Urology;  Laterality: N/A;  . Cystoscopy w/ retrogrades Bilateral 02/21/2014    Procedure: CYSTOSCOPY WITH RETROGRADE PYELOGRAM, Bilateral ;  Surgeon: [name], MD;  Location: [facility];  Service: Urology;  Laterality: Bilateral;    reports that she has never smoked. She does not have any smokeless tobacco history on file. She reports that she drinks alcohol. She reports that she does not use illicit drugs. family history includes Cancer in her mother; Hypertension in her father. Allergies  Allergen Reactions  . Codeine Nausea And Vomiting  . Penicillins Other (See Comments)  Unknown childhood reaction   Current Outpatient Prescriptions on File Prior to Visit  Medication Sig Dispense Refill  . aspirin EC 81 MG tablet Take 1 tablet (81 mg total) by mouth daily. 90 tablet 11  . atorvastatin (LIPITOR) 10 MG tablet TAKE ONE TABLET BY MOUTH ONCE DAILY 90 tablet 0  . Cholecalciferol (VITAMIN D3) 1000 UNITS CAPS Take 1 capsule by mouth.    . cyclobenzaprine (FLEXERIL) 5 MG  tablet Take 1 tablet (5 mg total) by mouth 3 (three) times daily as needed for muscle spasms. 60 tablet 1  . fish oil-omega-3 fatty acids 1000 MG capsule Take 2 g by mouth daily.    . irbesartan (AVAPRO) 300 MG tablet TAKE ONE TABLET BY MOUTH ONCE DAILY 90 tablet 3  . irbesartan (AVAPRO) 300 MG tablet Take 1 tablet (300 mg total) by mouth every morning. 90 tablet 3  . QUEtiapine (SEROQUEL) 50 MG tablet Take 1 tablet (50 mg total) by mouth at bedtime as needed. 90 tablet 1  . amLODipine (NORVASC) 5 MG tablet Take 1 tablet (5 mg total) by mouth every morning. 90 tablet 3   No current facility-administered medications on file prior to visit.   Review of Systems Constitutional: Negative for increased diaphoresis, other activity, appetite or siginficant weight change other than noted HENT: Negative for worsening hearing loss, ear pain, facial swelling, mouth sores and neck stiffness.   Eyes: Negative for other worsening pain, redness or visual disturbance.  Respiratory: Negative for shortness of breath and wheezing  Cardiovascular: Negative for chest pain and palpitations.  Gastrointestinal: Negative for diarrhea, blood in stool, abdominal distention or other pain Genitourinary: Negative for hematuria, flank pain or change in urine volume.  Musculoskeletal: Negative for myalgias or other joint complaints.  Skin: Negative for color change and wound or drainage.  Neurological: Negative for syncope and numbness. other than noted Hematological: Negative for adenopathy. or other swelling Psychiatric/Behavioral: Negative for hallucinations, SI, self-injury, decreased concentration or other worsening agitation.      Objective:   Physical Exam BP 132/80 mmHg  Pulse 90  Temp(Src) 98.3 F (36.8 C) (Oral)  Resp 20  Wt 177 lb (80.287 kg)  SpO2 97% VS noted,  Constitutional: Pt is oriented to person, place, and time. Appears well-developed and well-nourished, in no significant distress Head:  Normocephalic and atraumatic.  Right Ear: External ear normal.  Left Ear: External ear normal.  Nose: Nose normal.  Mouth/Throat: Oropharynx is clear and moist.  Eyes: Conjunctivae and EOM are normal. Pupils are equal, round, and reactive to light.  Neck: Normal range of motion. Neck supple. No JVD present. No tracheal deviation present or significant neck LA or mass Cardiovascular: Normal rate, regular rhythm, normal heart sounds and intact distal pulses.   Pulmonary/Chest: Effort normal and breath sounds without rales or wheezing  Abdominal: Soft. Bowel sounds are normal. NT. No HSM  Musculoskeletal: Normal range of motion. Exhibits no edema.  Lymphadenopathy:  Has no cervical adenopathy.  Neurological: Pt is alert and oriented to person, place, and time. Pt has normal reflexes. No cranial nerve deficit. Motor grossly intact Skin: Skin is warm and dry. No rash noted.  Psychiatric:  Has normal mood and affect. Behavior is normal.   ECG reviewed as per emr    Assessment & Plan:

## 2015-07-11 NOTE — Assessment & Plan Note (Signed)

## 2015-07-11 NOTE — Progress Notes (Signed)
Pre visit review using our clinic review tool, if applicable. No additional management support is needed unless otherwise documented below in the visit note. 

## 2015-07-11 NOTE — Assessment & Plan Note (Signed)
stable overall by history and exam, recent data reviewed with pt, and pt to continue medical treatment as before,  to f/u any worsening symptoms or concerns BP Readings from Last 3 Encounters:  07/11/15 132/80  02/12/15 138/72  06/06/14 150/86

## 2015-07-11 NOTE — Patient Instructions (Addendum)
Please take all new medication as prescribed - the tramadol ER for pain  Please consider making appt with Dr Tamala Julian in this office if not better  Please continue all other medications as before, and refills have been done if requested.  Please have the pharmacy call with any other refills you may need.  Please continue your efforts at being more active, low cholesterol diet, and weight control.  You are otherwise up to date with prevention measures today.  Please keep your appointments with your specialists as you may have planned  Your blood work was done this am  You will be contacted by phone if any changes need to be made immediately.  Otherwise, you will receive a letter about your results with an explanation, but please check with MyChart first.  Please remember to sign up for MyChart if you have not done so, as this will be important to you in the future with finding out test results, communicating by private email, and scheduling acute appointments online when needed.  Please return in 1 year for your yearly visit, or sooner if needed, with Lab testing done 3-5 days before

## 2015-07-11 NOTE — Assessment & Plan Note (Signed)
Mild, diet controlled, stable overall by history and exam, recent data reviewed with pt, and pt to continue medical treatment as before,  to f/u any worsening symptoms or concerns,  Lab Results  Component Value Date   HGBA1C 6.3 06/06/2014

## 2015-07-11 NOTE — Assessment & Plan Note (Addendum)
Asympt, ongoing noted at least since sept 2015 ED visit, declines further tx such as cardizem or BB, tsh normal, declines echo or EP referral  ECG reviewed as per emr

## 2015-07-11 NOTE — Assessment & Plan Note (Signed)
stable overall by history and exam, recent data reviewed with pt, and pt to continue medical treatment as before,  to f/u any worsening symptoms or concerns Lab Results  Component Value Date   LDLCALC 93 06/06/2014    

## 2015-07-17 ENCOUNTER — Telehealth: Payer: Self-pay | Admitting: Internal Medicine

## 2015-07-17 NOTE — Telephone Encounter (Signed)
Pt called in and said that dr Jenny Reichmann offered her a medication.  She told him she didn't want it but she decided that she does.  Can you call her when you get a chance?

## 2015-07-18 MED ORDER — METOPROLOL TARTRATE 25 MG PO TABS
12.5000 mg | ORAL_TABLET | Freq: Two times a day (BID) | ORAL | Status: DC
Start: 2015-07-18 — End: 2016-07-31

## 2015-07-18 NOTE — Telephone Encounter (Signed)
Ok to take the metoprolol 25 mg - 1/2 bid - done erx  Corinne to let pt know

## 2015-07-18 NOTE — Telephone Encounter (Signed)
Patient called and stated that you had offered placing her on a medication for her heart rate. She states that when you looked on her EKG you stated that she could be placed on a medication. She first told you no that she did not want to be on it however she has now changed her mind. Please advise

## 2015-07-18 NOTE — Telephone Encounter (Signed)
Patient has called back in regards  °

## 2015-08-27 ENCOUNTER — Encounter: Payer: Self-pay | Admitting: Internal Medicine

## 2015-08-27 ENCOUNTER — Ambulatory Visit (INDEPENDENT_AMBULATORY_CARE_PROVIDER_SITE_OTHER): Payer: Medicare Other | Admitting: Internal Medicine

## 2015-08-27 ENCOUNTER — Other Ambulatory Visit: Payer: Self-pay | Admitting: Internal Medicine

## 2015-08-27 VITALS — BP 140/78 | HR 63 | Temp 98.0°F | Resp 20 | Wt 172.0 lb

## 2015-08-27 DIAGNOSIS — R300 Dysuria: Secondary | ICD-10-CM | POA: Diagnosis not present

## 2015-08-27 DIAGNOSIS — N898 Other specified noninflammatory disorders of vagina: Secondary | ICD-10-CM

## 2015-08-27 DIAGNOSIS — I10 Essential (primary) hypertension: Secondary | ICD-10-CM

## 2015-08-27 DIAGNOSIS — N9489 Other specified conditions associated with female genital organs and menstrual cycle: Secondary | ICD-10-CM | POA: Diagnosis not present

## 2015-08-27 DIAGNOSIS — L237 Allergic contact dermatitis due to plants, except food: Secondary | ICD-10-CM | POA: Diagnosis not present

## 2015-08-27 DIAGNOSIS — E119 Type 2 diabetes mellitus without complications: Secondary | ICD-10-CM

## 2015-08-27 LAB — POCT URINALYSIS DIPSTICK
Bilirubin, UA: NEGATIVE
Blood, UA: NEGATIVE
GLUCOSE UA: NEGATIVE
Ketones, UA: NEGATIVE
Nitrite, UA: NEGATIVE
Protein, UA: NEGATIVE
SPEC GRAV UA: 1.025
UROBILINOGEN UA: NEGATIVE
pH, UA: 7.5

## 2015-08-27 MED ORDER — LEVOFLOXACIN 250 MG PO TABS: 250.0000 mg | ORAL_TABLET | Freq: Every day | ORAL | Status: DC

## 2015-08-27 MED ORDER — TRIAMCINOLONE ACETONIDE 0.1 % EX CREA: 1.0000 "application " | TOPICAL_CREAM | Freq: Two times a day (BID) | CUTANEOUS | Status: DC

## 2015-08-27 MED ORDER — METHYLPREDNISOLONE ACETATE 80 MG/ML IJ SUSP: 80.0000 mg | Freq: Once | INTRAMUSCULAR | Status: DC

## 2015-08-27 NOTE — Progress Notes (Signed)
Subjective:    Patient ID: Rhonda Norman, female    DOB: 10-06-1932, 82 y.o.   MRN: [id number]  HPI  Here with c/o several areas of erythema spots with itching to bilat UE's, just not getting better after 2-3 days after working in the yard, has not tried any otc, seems like rashes in the past as well after working in the yard.  Also c/o urinary odor and frequency/dysuria for 2 days new onset, but Denies urinary symptoms such as urgency, flank pain, hematuria or n/v, fever, chills.  Pt denies chest pain, increased sob or doe, wheezing, orthopnea, PND, increased LE swelling, palpitations, dizziness or syncope.  Pt denies new neurological symptoms such as new headache, or facial or extremity weakness or numbness   Pt denies polydipsia, polyuria Past Medical History  Diagnosis Date  . Hyperlipidemia   . Anxiety   . Depression   . Hypertension   . GERD (gastroesophageal reflux disease)   . Arthritis     DJD-right knee  . CAD (coronary artery disease)     non obstructive, [name], april 2010  . History of iron deficiency anemia   . Bladder cancer ([place])     high grade superficial bladder cancer--  monitored by dr [name]  . Bilateral renal cysts     non-complex  . Type 2 diabetes mellitus (HCC)     per pcp note Dr Cathlean Cower--  currently not taking meds and pt denies DX   Past Surgical History  Procedure Laterality Date  . Transurethral resection of bladder tumor with gyrus (turbt-gyrus) N/A 01/04/2014    Procedure: TRANSURETHRAL RESECTION OF BLADDER TUMOR WITH GYRUS (TURBT-GYRUS);  Surgeon: [name], MD;  Location: [facility];  Service: Urology;  Laterality: N/A;  . Cystoscopy w/ ureteral stent placement Bilateral 01/04/2014    Procedure: CYSTOSCOPY WITH bilateral RETROGRADE PYELOGRAM;  Surgeon: [name], MD;  Location: [facility];  Service: Urology;  Laterality: Bilateral;  . Cardiovascular stress test  08-15-2008  dr Cathie Olden    abnormal  stress dual isotope/  BP drop following exercise is concern/ no evidence ischenmia/ LV function normal/ ef 74%  . Cardiac catheterization  08-22-2008  dr Cathie Olden    mild to moderate coronary artery irregularies/  dRCA 30%/  ef 60%/  possible coronary spasm  . Abdominal hysterectomy  1970's    w/ bilateral salpingoophorectomy  . Cataract extraction w/ intraocular lens  implant, bilateral  2013  . Transurethral resection of bladder tumor with gyrus (turbt-gyrus) N/A 02/21/2014    Procedure: TRANSURETHRAL RESECTION OF BLADDER TUMOR WITH GYRUS (TURBT-GYRUS);  Surgeon: [name], MD;  Location: [facility];  Service: Urology;  Laterality: N/A;  . Cystoscopy w/ retrogrades Bilateral 02/21/2014    Procedure: CYSTOSCOPY WITH RETROGRADE PYELOGRAM, Bilateral ;  Surgeon: [name], MD;  Location: [facility];  Service: Urology;  Laterality: Bilateral;    reports that she has never smoked. She does not have any smokeless tobacco history on file. She reports that she drinks alcohol. She reports that she does not use illicit drugs. family history includes Cancer in her mother; Hypertension in her father. Allergies  Allergen Reactions  . Codeine Nausea And Vomiting  . Penicillins Other (See Comments)    Unknown childhood reaction   Current Outpatient Prescriptions on File Prior to Visit  Medication Sig Dispense Refill  . Cholecalciferol (VITAMIN D3) 1000 UNITS CAPS Take 1 capsule by mouth.    . cyclobenzaprine (FLEXERIL) 5  MG tablet Take 1 tablet (5 mg total) by mouth 3 (three) times daily as needed for muscle spasms. 60 tablet 1  . fish oil-omega-3 fatty acids 1000 MG capsule Take 2 g by mouth daily.    . irbesartan (AVAPRO) 300 MG tablet TAKE ONE TABLET BY MOUTH ONCE DAILY 90 tablet 3  . metoprolol tartrate (LOPRESSOR) 25 MG tablet Take 0.5 tablets (12.5 mg total) by mouth 2 (two) times daily. 90 tablet 3   No current facility-administered medications on file  prior to visit.   Review of Systems  Constitutional: Negative for unusual diaphoresis or night sweats HENT: Negative for ear swelling or discharge Eyes: Negative for worsening visual haziness  Respiratory: Negative for choking and stridor.   Gastrointestinal: Negative for distension or worsening eructation Genitourinary: Negative for retention or change in urine volume.  Musculoskeletal: Negative for other MSK pain or swelling Skin: Negative for color change and worsening wound Neurological: Negative for tremors and numbness other than noted  Psychiatric/Behavioral: Negative for decreased concentration or agitation other than above       Objective:   Physical Exam BP 140/78 mmHg  Pulse 63  Temp(Src) 98 F (36.7 C) (Oral)  Resp 20  Wt 172 lb (78.019 kg)  SpO2 98% VS noted, non toxic  Constitutional: Pt appears in no apparent distress HENT: Head: NCAT.  Right Ear: External ear normal.  Left Ear: External ear normal.  Eyes: . Pupils are equal, round, and reactive to light. Conjunctivae and EOM are normal Neck: Normal range of motion. Neck supple.  Cardiovascular: Normal rate and regular rhythm.   Pulmonary/Chest: Effort normal and breath sounds without rales or wheezing.  Abd:  Soft, ND, + BS, with mild tender low mid abd, no flank tender Neurological: Pt is alert. Not confused , motor grossly intact Skin: Skin is warm+ numerous small nontender erythem lesions to arms rash, no LE edema Psychiatric: Pt behavior is normal. No agitation.   udip -  POCT Urinalysis Dipstick  Status: Finalresult Visible to patient:  Not Released Dx:  Vaginal odor              Ref Range 6:00 PM (08/27/15) 60yr ago (06/29/13) 46yr ago (06/29/13) 52yr ago (06/01/12)    Color, UA  yellow  light yellow     Clarity, UA  cloudy  clear     Glucose, UA  negative  negative     Bilirubin, UA  negative  negative     Ketones, UA  negative  negative     Spec Grav, UA  1.025  1.015      Blood, UA  negative  large     pH, UA  7.5  5.0     Protein, UA  negative  negative     Urobilinogen, UA  negative 0.2 0.2 0.2    Nitrite, UA  negative  negative     Leukocytes, UA Negative  small (1+) (A) SMALL (A) moderate (2+)R MODERATE   Resulting Agency                 Assessment & Plan:

## 2015-08-27 NOTE — Patient Instructions (Signed)
You had the steroid shot today   Please take all new medication as prescribed - the steroid cream for the newer spots, and the antibiotic  Please continue all other medications as before, and refills have been done if requested.  Please have the pharmacy call with any other refills you may need.  Please keep your appointments with your specialists as you may have planned

## 2015-08-27 NOTE — Progress Notes (Signed)
Pre visit review using our clinic review tool, if applicable. No additional management support is needed unless otherwise documented below in the visit note. 

## 2015-08-29 ENCOUNTER — Telehealth: Payer: Self-pay | Admitting: Internal Medicine

## 2015-08-29 NOTE — Telephone Encounter (Signed)
Ok to take for now as risk is acceptable

## 2015-08-29 NOTE — Telephone Encounter (Signed)
[facility] called stated we send in rx for Levaquin and it is interact with Seroquel. Please advise on what to do about this.

## 2015-08-29 NOTE — Telephone Encounter (Signed)
Unable to reach patient left message to give us a call back. 

## 2015-09-01 DIAGNOSIS — L237 Allergic contact dermatitis due to plants, except food: Secondary | ICD-10-CM | POA: Insufficient documentation

## 2015-09-01 DIAGNOSIS — R300 Dysuria: Secondary | ICD-10-CM | POA: Insufficient documentation

## 2015-09-01 NOTE — Assessment & Plan Note (Signed)
Mild to mod, for depomedrol IM, topical steroid for worse areas of arms, to f/u any worsening symptoms or concerns

## 2015-09-01 NOTE — Assessment & Plan Note (Signed)
stable overall by history and exam, recent data reviewed with pt, and pt to continue medical treatment as before,  to f/u any worsening symptoms or concerns Lab Results  Component Value Date   HGBA1C 6.3 07/11/2015

## 2015-09-01 NOTE — Assessment & Plan Note (Signed)
stable overall by history and exam, recent data reviewed with pt, and pt to continue medical treatment as before,  to f/u any worsening symptoms or concerns BP Readings from Last 3 Encounters:  08/27/15 140/78  07/11/15 132/80  02/12/15 138/72

## 2015-09-01 NOTE — Assessment & Plan Note (Signed)
Mild to mod, possible UTI by udip and exam, for antibx course,  to f/u any worsening symptoms or concerns

## 2015-11-01 ENCOUNTER — Other Ambulatory Visit: Payer: Self-pay | Admitting: Internal Medicine

## 2015-11-01 NOTE — Telephone Encounter (Signed)
Please advise 

## 2015-11-01 NOTE — Telephone Encounter (Signed)
Medication refill sent to pharmacy  

## 2015-11-01 NOTE — Telephone Encounter (Signed)
3 rx done erx  Tramadol Done hardcopy to [facility]

## 2015-11-29 ENCOUNTER — Other Ambulatory Visit: Payer: Self-pay | Admitting: Internal Medicine

## 2015-12-26 ENCOUNTER — Other Ambulatory Visit: Payer: Self-pay | Admitting: Urology

## 2015-12-27 ENCOUNTER — Other Ambulatory Visit: Payer: Self-pay | Admitting: Urology

## 2015-12-27 ENCOUNTER — Encounter (HOSPITAL_BASED_OUTPATIENT_CLINIC_OR_DEPARTMENT_OTHER): Payer: Self-pay | Admitting: *Deleted

## 2015-12-31 ENCOUNTER — Encounter (HOSPITAL_BASED_OUTPATIENT_CLINIC_OR_DEPARTMENT_OTHER): Payer: Self-pay | Admitting: *Deleted

## 2015-12-31 NOTE — Progress Notes (Signed)
NPO AFTER MN W/ EXCEPTION CLEAR LIQUIDS UNTIL 0800 (NO CREAM / PRODUCTS).  ARRIVE AT 1215.  NEEDS ISTAT 8 AND EKG.   WILL TAKE AM MEDS W/ SIPS OF WATER.

## 2016-01-01 ENCOUNTER — Ambulatory Visit (HOSPITAL_BASED_OUTPATIENT_CLINIC_OR_DEPARTMENT_OTHER)
Admission: RE | Admit: 2016-01-01 | Discharge: 2016-01-01 | Disposition: A | Discharge status: Home / Self Care | Payer: Medicare Other | Source: Ambulatory Visit | Attending: Urology | Admitting: Urology

## 2016-01-01 ENCOUNTER — Other Ambulatory Visit: Payer: Self-pay

## 2016-01-01 ENCOUNTER — Encounter (HOSPITAL_BASED_OUTPATIENT_CLINIC_OR_DEPARTMENT_OTHER): Payer: Self-pay | Admitting: *Deleted

## 2016-01-01 ENCOUNTER — Encounter (HOSPITAL_BASED_OUTPATIENT_CLINIC_OR_DEPARTMENT_OTHER)
Admission: RE | Disposition: A | Discharge status: Home / Self Care | Payer: Self-pay | Source: Ambulatory Visit | Attending: Urology

## 2016-01-01 ENCOUNTER — Ambulatory Visit (HOSPITAL_BASED_OUTPATIENT_CLINIC_OR_DEPARTMENT_OTHER): Payer: Medicare Other | Admitting: Anesthesiology

## 2016-01-01 DIAGNOSIS — I10 Essential (primary) hypertension: Secondary | ICD-10-CM | POA: Diagnosis not present

## 2016-01-01 DIAGNOSIS — N3020 Other chronic cystitis without hematuria: Secondary | ICD-10-CM | POA: Diagnosis not present

## 2016-01-01 DIAGNOSIS — I2510 Atherosclerotic heart disease of native coronary artery without angina pectoris: Secondary | ICD-10-CM | POA: Diagnosis not present

## 2016-01-01 DIAGNOSIS — E785 Hyperlipidemia, unspecified: Secondary | ICD-10-CM | POA: Diagnosis not present

## 2016-01-01 DIAGNOSIS — Z8551 Personal history of malignant neoplasm of bladder: Secondary | ICD-10-CM | POA: Insufficient documentation

## 2016-01-01 DIAGNOSIS — N329 Bladder disorder, unspecified: Secondary | ICD-10-CM | POA: Insufficient documentation

## 2016-01-01 DIAGNOSIS — E119 Type 2 diabetes mellitus without complications: Secondary | ICD-10-CM | POA: Insufficient documentation

## 2016-01-01 DIAGNOSIS — C801 Malignant (primary) neoplasm, unspecified: Secondary | ICD-10-CM | POA: Diagnosis present

## 2016-01-01 HISTORY — DX: Other supraventricular tachycardia: I47.19

## 2016-01-01 HISTORY — PX: CYSTOSCOPY W/ RETROGRADES: SHX1426

## 2016-01-01 HISTORY — DX: Presence of dental prosthetic device (complete) (partial): Z97.2

## 2016-01-01 HISTORY — DX: Supraventricular tachycardia: I47.1

## 2016-01-01 HISTORY — DX: Presence of dental prosthetic device (complete) (partial): K08.109

## 2016-01-01 HISTORY — PX: TRANSURETHRAL RESECTION OF BLADDER TUMOR: SHX2575

## 2016-01-01 LAB — GLUCOSE, CAPILLARY: GLUCOSE-CAPILLARY: 113 mg/dL — AB (ref 65–99)

## 2016-01-01 LAB — POCT I-STAT, CHEM 8
BUN: 14 mg/dL (ref 6–20)
CREATININE: 0.9 mg/dL (ref 0.44–1.00)
Calcium, Ion: 1.29 mmol/L (ref 1.15–1.40)
Chloride: 107 mmol/L (ref 101–111)
GLUCOSE: 119 mg/dL — AB (ref 65–99)
HCT: 41 % (ref 36.0–46.0)
HEMOGLOBIN: 13.9 g/dL (ref 12.0–15.0)
POTASSIUM: 4.2 mmol/L (ref 3.5–5.1)
Sodium: 142 mmol/L (ref 135–145)
TCO2: 25 mmol/L (ref 0–100)

## 2016-01-01 SURGERY — TURBT (TRANSURETHRAL RESECTION OF BLADDER TUMOR)
Anesthesia: General | Site: Ureter

## 2016-01-01 MED ORDER — PHENYLEPHRINE 40 MCG/ML (10ML) SYRINGE FOR IV PUSH (FOR BLOOD PRESSURE SUPPORT)
PREFILLED_SYRINGE | INTRAVENOUS | Status: AC
  Filled 2016-01-01: qty 10

## 2016-01-01 MED ORDER — STERILE WATER FOR IRRIGATION IR SOLN
Status: DC | PRN
  Administered 2016-01-01: 500 mL

## 2016-01-01 MED ORDER — DEXAMETHASONE SODIUM PHOSPHATE 10 MG/ML IJ SOLN
INTRAMUSCULAR | Status: AC
  Filled 2016-01-01: qty 1

## 2016-01-01 MED ORDER — LIDOCAINE 2% (20 MG/ML) 5 ML SYRINGE
INTRAMUSCULAR | Status: AC
  Filled 2016-01-01: qty 5

## 2016-01-01 MED ORDER — SODIUM CHLORIDE 0.9 % IR SOLN
Status: DC | PRN
  Administered 2016-01-01: 6000 mL via INTRAVESICAL

## 2016-01-01 MED ORDER — PHENYLEPHRINE 40 MCG/ML (10ML) SYRINGE FOR IV PUSH (FOR BLOOD PRESSURE SUPPORT)
PREFILLED_SYRINGE | INTRAVENOUS | Status: DC | PRN
  Administered 2016-01-01 (×4): 80 ug via INTRAVENOUS

## 2016-01-01 MED ORDER — PROPOFOL 10 MG/ML IV BOLUS
INTRAVENOUS | Status: DC | PRN
  Administered 2016-01-01: 100 mg via INTRAVENOUS

## 2016-01-01 MED ORDER — FENTANYL CITRATE (PF) 100 MCG/2ML IJ SOLN
INTRAMUSCULAR | Status: AC
  Filled 2016-01-01: qty 2

## 2016-01-01 MED ORDER — FENTANYL CITRATE (PF) 100 MCG/2ML IJ SOLN
INTRAMUSCULAR | Status: DC | PRN
  Administered 2016-01-01 (×2): 25 ug via INTRAVENOUS

## 2016-01-01 MED ORDER — ONDANSETRON HCL 4 MG/2ML IJ SOLN
INTRAMUSCULAR | Status: DC | PRN
  Administered 2016-01-01: 4 mg via INTRAVENOUS

## 2016-01-01 MED ORDER — ONDANSETRON HCL 4 MG/2ML IJ SOLN
INTRAMUSCULAR | Status: AC
  Filled 2016-01-01: qty 2

## 2016-01-01 MED ORDER — LIDOCAINE 2% (20 MG/ML) 5 ML SYRINGE
INTRAMUSCULAR | Status: DC | PRN
  Administered 2016-01-01: 80 mg via INTRAVENOUS

## 2016-01-01 MED ORDER — MITOMYCIN CHEMO FOR BLADDER INSTILLATION 40 MG
40.0000 mg | Freq: Once | INTRAVENOUS | Status: AC
  Administered 2016-01-01: 40 mg via INTRAVESICAL
  Filled 2016-01-01: qty 40

## 2016-01-01 MED ORDER — FENTANYL CITRATE (PF) 100 MCG/2ML IJ SOLN
25.0000 ug | INTRAMUSCULAR | Status: DC | PRN
  Filled 2016-01-01: qty 1

## 2016-01-01 MED ORDER — PROPOFOL 10 MG/ML IV BOLUS
INTRAVENOUS | Status: AC
  Filled 2016-01-01: qty 20

## 2016-01-01 MED ORDER — GENTAMICIN SULFATE 40 MG/ML IJ SOLN
320.0000 mg | Freq: Once | INTRAVENOUS | Status: AC
  Administered 2016-01-01: 320 mg via INTRAVENOUS
  Filled 2016-01-01 (×2): qty 8

## 2016-01-01 MED ORDER — GENTAMICIN IN SALINE 1.6-0.9 MG/ML-% IV SOLN
80.0000 mg | INTRAVENOUS | Status: DC
  Filled 2016-01-01: qty 50

## 2016-01-01 MED ORDER — LIDOCAINE HCL (CARDIAC) 20 MG/ML IV SOLN: INTRAVENOUS | Status: DC | PRN

## 2016-01-01 MED ORDER — DEXAMETHASONE SODIUM PHOSPHATE 4 MG/ML IJ SOLN
INTRAMUSCULAR | Status: DC | PRN
  Administered 2016-01-01: 4 mg via INTRAVENOUS

## 2016-01-01 MED ORDER — IOHEXOL 300 MG/ML  SOLN
INTRAMUSCULAR | Status: DC | PRN
  Administered 2016-01-01: 8 mL via URETHRAL

## 2016-01-01 MED ORDER — TRAMADOL HCL 50 MG PO TABS: 50.0000 mg | ORAL_TABLET | Freq: Four times a day (QID) | ORAL | 0 refills | Status: DC | PRN

## 2016-01-01 MED ORDER — LACTATED RINGERS IV SOLN
INTRAVENOUS | Status: DC
  Administered 2016-01-01: 13:00:00 via INTRAVENOUS
  Filled 2016-01-01: qty 1000

## 2016-01-01 MED FILL — traMADol HCL 50 MG TABS: 50 | 5 days supply | Qty: 20 | Fill #0

## 2016-01-01 SURGICAL SUPPLY — 32 items
BAG DRAIN URO-CYSTO SKYTR STRL (DRAIN) ×3 IMPLANT
BAG DRN UROCATH (DRAIN) ×2
BAG URINE DRAINAGE (UROLOGICAL SUPPLIES) ×1 IMPLANT
CATH FOLEY 2WAY SLVR  5CC 16FR (CATHETERS) ×1
CATH FOLEY 2WAY SLVR  5CC 22FR (CATHETERS)
CATH FOLEY 2WAY SLVR 30CC 20FR (CATHETERS) IMPLANT
CATH FOLEY 2WAY SLVR 5CC 16FR (CATHETERS) IMPLANT
CATH FOLEY 2WAY SLVR 5CC 22FR (CATHETERS) IMPLANT
CATH INTERMIT  6FR 70CM (CATHETERS) ×1 IMPLANT
CLOTH BEACON ORANGE TIMEOUT ST (SAFETY) ×3 IMPLANT
GLOVE BIO SURGEON STRL SZ7.5 (GLOVE) ×3 IMPLANT
GLOVE INDICATOR 7.5 STRL GRN (GLOVE) ×2 IMPLANT
GOWN STRL REUS W/ TWL LRG LVL3 (GOWN DISPOSABLE) ×4 IMPLANT
GOWN STRL REUS W/ TWL XL LVL3 (GOWN DISPOSABLE) IMPLANT
GOWN STRL REUS W/TWL LRG LVL3 (GOWN DISPOSABLE) ×7 IMPLANT
GOWN STRL REUS W/TWL XL LVL3 (GOWN DISPOSABLE) ×3
GUIDEWIRE ANG ZIPWIRE 038X150 (WIRE) IMPLANT
GUIDEWIRE STR DUAL SENSOR (WIRE) ×3 IMPLANT
IV NS 1000ML (IV SOLUTION)
IV NS 1000ML BAXH (IV SOLUTION) ×2 IMPLANT
IV NS IRRIG 3000ML ARTHROMATIC (IV SOLUTION) ×4 IMPLANT
KIT ROOM TURNOVER WOR (KITS) ×3 IMPLANT
MANIFOLD NEPTUNE II (INSTRUMENTS) ×3 IMPLANT
NS IRRIG 500ML POUR BTL (IV SOLUTION) ×3 IMPLANT
PACK CYSTO (CUSTOM PROCEDURE TRAY) ×3 IMPLANT
PLUG CATH AND CAP STER (CATHETERS) ×1 IMPLANT
SET ASPIRATION TUBING (TUBING) IMPLANT
SYRINGE 10CC LL (SYRINGE) ×3 IMPLANT
SYRINGE IRR TOOMEY STRL 70CC (SYRINGE) IMPLANT
TUBE CONNECTING 12X1/4 (SUCTIONS) ×1 IMPLANT
TUBE FEEDING 8FR 16IN STR KANG (MISCELLANEOUS) ×2 IMPLANT
WATER STERILE IRR 500ML POUR (IV SOLUTION) ×1 IMPLANT

## 2016-01-01 NOTE — Anesthesia Postprocedure Evaluation (Signed)
Anesthesia Post Note  Patient: Rhonda Norman  Procedure(s) Performed: Procedure(s) (LRB): TRANSURETHRAL RESECTION OF BLADDER TUMOR (TURBT) WITH MITOMYCIN (N/A) CYSTOSCOPY WITH RETROGRADE PYELOGRAM (Bilateral)  Patient location during evaluation: PACU Anesthesia Type: General Level of consciousness: awake and alert Pain management: pain level controlled Vital Signs Assessment: post-procedure vital signs reviewed and stable Respiratory status: spontaneous breathing, nonlabored ventilation and respiratory function stable Cardiovascular status: blood pressure returned to baseline and stable Postop Assessment: no signs of nausea or vomiting Anesthetic complications: no    Last Vitals:  Vitals:   01/01/16 1747 01/01/16 1800  BP: (!) 181/94 (!) 183/82  Pulse: (!) 58 (!) 53  Resp: 15 19  Temp:      Last Pain:  Vitals:   01/01/16 1214  TempSrc: Oral                 Paisly F[place]ut A

## 2016-01-01 NOTE — Brief Op Note (Signed)
01/01/2016  4:02 PM  PATIENT:  Rhonda Norman  82 y.o. female  PRE-OPERATIVE DIAGNOSIS:  RECURRENT BLADDER TUMOR  POST-OPERATIVE DIAGNOSIS:  * No post-op diagnosis entered *  PROCEDURE:  Procedure(s): TRANSURETHRAL RESECTION OF BLADDER TUMOR (TURBT) WITH MITOMYCIN (N/A) CYSTOSCOPY WITH RETROGRADE PYELOGRAM (Bilateral)  SURGEON:  Surgeon(s) and Role:    * [name], MD - Primary  PHYSICIAN ASSISTANT:   ASSISTANTS: none   ANESTHESIA:   general  EBL:  Total I/O In: 700 [I.V.:700] Out: -   BLOOD ADMINISTERED:none  DRAINS: 30F foley to gravity   LOCAL MEDICATIONS USED:  NONE  SPECIMEN:  Source of Specimen:  1 - bladder dome tumor, 2 - old Rt wall resection site  DISPOSITION OF SPECIMEN:  PATHOLOGY  COUNTS:  YES  TOURNIQUET:  * No tourniquets in log *  DICTATION: .Other Dictation: Dictation Number [id number]  PLAN OF CARE: Discharge to home after PACU  PATIENT DISPOSITION:  PACU - hemodynamically stable.   Delay start of Pharmacological VTE agent (>24hrs) due to surgical blood loss or risk of bleeding: yes

## 2016-01-01 NOTE — Anesthesia Procedure Notes (Signed)
Procedure Name: LMA Insertion Performed by: Franne Grip Pre-anesthesia Checklist: Patient identified, Emergency Drugs available, Suction available and Patient being monitored Patient Re-evaluated:Patient Re-evaluated prior to inductionOxygen Delivery Method: Circle system utilized Preoxygenation: Pre-oxygenation with 100% oxygen Intubation Type: IV induction Ventilation: Mask ventilation without difficulty LMA: LMA inserted LMA Size: 4.0 Number of attempts: 1 Airway Equipment and Method: Bite block Placement Confirmation: positive ETCO2 Tube secured with: Tape Dental Injury: Teeth and Oropharynx as per pre-operative assessment

## 2016-01-01 NOTE — Transfer of Care (Signed)
  Last Vitals:  Vitals:   01/01/16 1214  BP: (!) 170/86  Pulse: (!) 54  Resp: 16  Temp: 36.9 C    Last Pain:  Vitals:   01/01/16 1214  TempSrc: Oral        Immediate Anesthesia Transfer of Care Note  Patient: Ulice Brilliant Bradburn  Procedure(s) Performed: Procedure(s) (LRB): TRANSURETHRAL RESECTION OF BLADDER TUMOR (TURBT) WITH MITOMYCIN (N/A) CYSTOSCOPY WITH RETROGRADE PYELOGRAM (Bilateral)  Patient Location: PACU  Anesthesia Type: General  Level of Consciousness: awake, alert  and oriented  Airway & Oxygen Therapy: Patient Spontanous Breathing and Patient connected to  oxygen  Post-op Assessment: Report given to PACU RN and Post -op Vital signs reviewed and stable  Post vital signs: Reviewed and stable  Complications: No apparent anesthesia complications

## 2016-01-01 NOTE — Anesthesia Preprocedure Evaluation (Signed)
Anesthesia Evaluation  Patient identified by MRN, date of birth, ID band Patient awake    Reviewed: Allergy & Precautions, NPO status , Patient's Chart, lab work & pertinent test results  Airway Mallampati: II  TM Distance: >3 FB Neck ROM: Full    Dental no notable dental hx.    Pulmonary neg pulmonary ROS,    Pulmonary exam normal breath sounds clear to auscultation       Cardiovascular hypertension, Pt. on medications and Pt. on home beta blockers + CAD  Normal cardiovascular exam+ dysrhythmias Supra Ventricular Tachycardia  Rhythm:Regular Rate:Normal     Neuro/Psych PSYCHIATRIC DISORDERS Anxiety Depression negative neurological ROS     GI/Hepatic Neg liver ROS, GERD  ,  Endo/Other  diabetes, Type 2  Renal/GU Renal disease  negative genitourinary   Musculoskeletal  (+) Arthritis ,   Abdominal   Peds negative pediatric ROS (+)  Hematology  (+) anemia ,   Anesthesia Other Findings   Reproductive/Obstetrics negative OB ROS                             Anesthesia Physical Anesthesia Plan  ASA: III  Anesthesia Plan: General   Post-op Pain Management:    Induction: Intravenous  Airway Management Planned: LMA  Additional Equipment:   Intra-op Plan:   Post-operative Plan: Extubation in OR  Informed Consent: I have reviewed the patients History and Physical, chart, labs and discussed the procedure including the risks, benefits and alternatives for the proposed anesthesia with the patient or authorized representative who has indicated his/her understanding and acceptance.   Dental advisory given  Plan Discussed with: CRNA  Anesthesia Plan Comments:         Anesthesia Quick Evaluation

## 2016-01-01 NOTE — Discharge Instructions (Signed)
1 - You may have urinary urgency (bladder spasms) and bloody urine on / off x few days. This is normal.  2 - Call MD or go to ER for fever >102, severe pain / nausea / vomiting not relieved by medications, or acute change in medical status  Transurethral Resection, Bladder Tumor A cancerous growth (tumor) can develop on the inside wall of the bladder. The bladder is the organ that holds urine. One way to remove the tumor is a procedure called a transurethral resection. The tumor is removed (resected) through the tube that carries urine from the bladder out of the body (urethra). No cuts (incisions) are made in the skin. Instead, the procedure is done through a thin telescope, called a resectoscope. Attached to it is a light and usually a tiny camera. The resectoscope is put into the urethra. In men, the urethra opens at the end of the penis. In women, it opens just above the vagina.  A transurethral resection is usually used to remove tumors that have not gotten too big or too deep. These are called Stage 0, Stage 1 or Stage 2 bladder cancers. LET YOUR CAREGIVER KNOW ABOUT:  On the day of the procedure, your caregivers will need to know the last time you had anything to eat or drink. This includes water, gum, and candy. In advance, make sure they know about:   Any allergies.  All medications you are taking, including:  Herbs, eyedrops, over-the-counter medications and creams.  Blood thinners (anticoagulants), aspirin or other drugs that could affect blood clotting.  Use of steroids (by mouth or as creams).  Previous problems with anesthetics, including local anesthetics.  Possibility of pregnancy, if this applies.  Any history of blood clots.  Any history of bleeding or other blood problems.  Previous surgery.  Smoking history.  Any recent symptoms of colds or infections.  Other health problems. RISKS AND COMPLICATIONS This is usually a safe procedure. Every procedure has risks,  though. For a transurethral resection, they include:  Infection. Antibiotic medication would need to be taken.  Bleeding.  Light bleeding may last for several days after the procedure.  If bleeding continues or is heavy, the bladder may need rinsing. Or, a new catheter might be put in for awhile.  Sometimes bed rest is needed.  Urination problems.  Pain and burning can occur when urinating. This usually goes away in a few days.  Scarring from the procedure can block the flow of urine.  Bladder damage.  It can be punctured or torn during removal of the tumor. If this happens, a catheter might be needed for longer. Antibiotics would be taken while the bladder heals.  Urine can leak through the hole or tear into the abdomen. If this happens, surgery may be needed to repair the bladder. BEFORE THE PROCEDURE   A medical evaluation will be done. This may include:  A physical examination.  Urine test. This is to make sure you do not have a urinary tract infection.  Blood tests.  A test that checks the heart's rhythm (electrocardiogram).  Talking with an anesthesiologist. This is the person who will be in charge of the medication (anesthesia) to keep you from feeling pain during the transurethral resection. You might be asleep during the procedure (general anesthesia) or numb from the waist down, but awake during the procedure (spinal anesthesia). Ask your surgeon what to expect.  The person who is having a transurethral resection needs to give what is called informed consent.  This requires signing a legal paper that gives permission for the procedure. To give informed consent:  You must understand how the procedure is done and why.  You must be told all the risks and benefits of the procedure.  You must sign the consent. Sometimes a legal guardian can do this.  Signing should be witnessed by a healthcare professional.  The day before the surgery, eat only a light dinner. Then,  do not eat or drink anything for at least 8 hours before the surgery. Ask your caregiver if it is OK to take any needed medicines with a sip of water.  Arrive at least an hour before the surgery or whenever your surgeon recommends. This will give you time to check in and fill out any needed paperwork. PROCEDURE  The preparation:  You will change into a hospital gown.  A needle will be inserted in your arm. This is an intravenous access tube (IV). Medication will be able to flow directly into your body through this needle.  Small monitors will be put on your body. They are used to check your heart, blood pressure, and oxygen level.  You might be given medication that will help you relax (sedative).  You will be given a general anesthetic or spinal anesthesia.  The procedure:  Once you are asleep or numb from the waist down, your legs will be placed in stirrups.  The resectoscope will be passed through the urethra into the bladder.  Fluid will be passed through the resectoscope. This will fill the bladder with water.  The surgeon will examine the bladder through the scope. If the scope has a camera, it can take pictures from inside the bladder. They can be projected onto a TV screen.  The surgeon will use various tools to remove the tumor in small pieces. Sometimes a laser (a beam of light energy) is used. Other tools may use electric current.  A tube (catheter) will often be placed so that urine can drain into a bag outside the body. This process helps stop bleeding. This tube keeps blood clots from blocking the urethra.  The procedure usually takes 30 to 45 minutes. AFTER THE PROCEDURE   You will stay in a recovery area until the anesthesia has worn off. Your blood pressure and pulse will be checked every so often. Then you will be taken to a hospital room.  You may continue to get fluids through the IV for awhile.  Some pain is normal. The catheter might be uncomfortable. Pain  is usually not severe. If it is, ask for pain medicine.  Your urine may look bloody after a transurethral resection. This is normal.  If bleeding is heavy, a hospital caregiver may rinse out the bladder (irrigation) through the catheter.  Once the urine is clear, the catheter will be taken out.  You will need to stay in the hospital until you can urinate on your own.  Most people stay in the hospital for up to 4 days. PROGNOSIS   Transurethral resection is considered the best way to treat bladder tumors that are not too far along. For most people, the treatment is successful. Sometimes, though, more treatment is needed.  Bladder cancers can come back even after a successful procedure. Because of this, be sure to have a checkup with your caregiver every 3 to 6 months. If everything is OK for 3 years, you can reduce the checkups to once a year.   This information is not intended to replace  advice given to you by your health care provider. Make sure you discuss any questions you have with your health care provider.   Document Released: 02/07/2009 Document Revised: 07/06/2011 Document Reviewed: 04/15/2009 Elsevier Interactive Patient Education 2016 [place] CARE INSTRUCTIONS  Activity: Rest for the remainder of the day.  Do not drive or operate equipment today.  You may resume normal activities in one to two days as instructed by your physician.   Meals: Drink plenty of liquids and eat light foods such as gelatin or soup this evening.  You may return to a normal meal plan tomorrow.  Return to Work: You may return to work in one to two days or as instructed by your physician.  Special Instructions / Symptoms: Call your physician if any of these symptoms occur:   -persistent or heavy bleeding  -bleeding which continues after first few urination  -large blood clots that are difficult to pass  -urine stream diminishes or stops completely  -fever equal to or  higher than 101 degrees Farenheit.  -cloudy urine with a strong, foul odor  -severe pain  Females should always wipe from front to back after elimination.  You may feel some burning pain when you urinate.  This should disappear with time.  Applying moist heat to the lower abdomen or a hot tub bath may help relieve the pain. \  Follow-Up / Date of Return Visit to Your Physician:   Call for an appointment to arrange follow-up.  Patient Signature:  ________________________________________________________  Nurse's Signature:  ________________________________________________________  Post Anesthesia Home Care Instructions  Activity: Get plenty of rest for the remainder of the day. A responsible adult should stay with you for 24 hours following the procedure.  For the next 24 hours, DO NOT: -Drive a car -Paediatric nurse -Drink alcoholic beverages -Take any medication unless instructed by your physician -Make any legal decisions or sign important papers.  Meals: Start with liquid foods such as gelatin or soup. Progress to regular foods as tolerated. Avoid greasy, spicy, heavy foods. If nausea and/or vomiting occur, drink only clear liquids until the nausea and/or vomiting subsides. Call your physician if vomiting continues.  Special Instructions/Symptoms: Your throat may feel dry or sore from the anesthesia or the breathing tube placed in your throat during surgery. If this causes discomfort, gargle with warm salt water. The discomfort should disappear within 24 hours.  If you had a scopolamine patch placed behind your ear for the management of post- operative nausea and/or vomiting:  1. The medication in the patch is effective for 72 hours, after which it should be removed.  Wrap patch in a tissue and discard in the trash. Wash hands thoroughly with soap and water. 2. You may remove the patch earlier than 72 hours if you experience unpleasant side effects which may include dry mouth,  dizziness or visual disturbances. 3. Avoid touching the patch. Wash your hands with soap and water after contact with the patch.

## 2016-01-01 NOTE — H&P (Signed)
Rhonda Norman is an 82 y.o. female.    Chief Complaint: Pre-op Transurethral Resection of Bladder Tumor  HPI:   1- High Grade Superficial Bladder Cancer- initially found on hematuria eval 11/2013. Non smoker. No dye/textile/solvent exposure.    Recent Course:   12/2013 TaG3 (large volume 7cm Rt wall) by TURBT --> re-resection 02/2014 no evidence of disease --> Full induction BCG x6   06/2014 Cysto no recurrence; 10/2014 Cysto no gross recurrence, some dome erythema (<1cm); 01/2015 Cysto NED / stable small dome erythema   05/2015 Cysto NED; 08/2015 Cysto NED; 11/2015 Cysto 71mm dome recurrence, new erythema superior-medial edge old right resection site.    PMH sig for benign hyst. No CV disease. No strong blood thinners. Her PCP is [name] with [name].    Today Rhonda Norman is seen to proceed with TURBT / retrogrades for likely small volume bladder cancer recurrence. Most recent UCX non-clonal, has been on proph Bactrim therefore prior to today. No interval fevers.    Past Medical History:  Diagnosis Date  . Anxiety   . Arthritis    DJD-right knee  . Atrial tachycardia, paroxysmal ([place])   . Bilateral renal cysts    non-complex  . Bladder cancer [facility]) urologist- dr Tammi Klippel   now recurrent  w/ hx high grade superficial carcinoma in 2015 s/p TURBT and chemo instillation  . CAD (coronary artery disease)    non obstructive, [name], april 2010  . Depression   . Full dentures   . GERD (gastroesophageal reflux disease)   . History of iron deficiency anemia   . Hyperlipidemia   . Hypertension   . Type 2 diabetes, diet controlled ([place])     Past Surgical History:  Procedure Laterality Date  . ABDOMINAL HYSTERECTOMY  1970's   w/ bilateral salpingoophorectomy  . CARDIAC CATHETERIZATION  08-22-2008  dr Cathie Olden   mild to moderate coronary artery irregularies/  dRCA 30%/  ef 60%/  possible coronary spasm  . CARDIOVASCULAR STRESS TEST  08-15-2008  dr Cathie Olden   abnormal stress dual  isotope/  BP drop following exercise is concern/ no evidence ischenmia/ LV function normal/ ef 74%  . CATARACT EXTRACTION W/ INTRAOCULAR LENS  IMPLANT, BILATERAL  2013  . CYSTOSCOPY W/ RETROGRADES Bilateral 02/21/2014   Procedure: CYSTOSCOPY WITH RETROGRADE PYELOGRAM, Bilateral ;  Surgeon: [name], MD;  Location: [facility];  Service: Urology;  Laterality: Bilateral;  . CYSTOSCOPY W/ URETERAL STENT PLACEMENT Bilateral 01/04/2014   Procedure: CYSTOSCOPY WITH bilateral RETROGRADE PYELOGRAM;  Surgeon: [name], MD;  Location: [facility];  Service: Urology;  Laterality: Bilateral;  . TRANSURETHRAL RESECTION OF BLADDER TUMOR WITH GYRUS (TURBT-GYRUS) N/A 01/04/2014   Procedure: TRANSURETHRAL RESECTION OF BLADDER TUMOR WITH GYRUS (TURBT-GYRUS);  Surgeon: [name], MD;  Location: [facility];  Service: Urology;  Laterality: N/A;  . TRANSURETHRAL RESECTION OF BLADDER TUMOR WITH GYRUS (TURBT-GYRUS) N/A 02/21/2014   Procedure: TRANSURETHRAL RESECTION OF BLADDER TUMOR WITH GYRUS (TURBT-GYRUS);  Surgeon: [name], MD;  Location: [facility];  Service: Urology;  Laterality: N/A;    Family History  Problem Relation Age of Onset  . Cancer Mother   . Hypertension Father    Social History:  reports that she has never smoked. She has never used smokeless tobacco. She reports that she drinks alcohol. She reports that she does not use drugs.  Allergies:  Allergies  Allergen Reactions  . Codeine Nausea And Vomiting  . Penicillins Other (See Comments)  Unknown childhood reaction    No prescriptions prior to admission.    No results found for this or any previous visit (from the past 48 hour(s)). No results found.  Review of Systems  Constitutional: Negative.   HENT: Negative.   Eyes: Negative.   Respiratory: Negative.   Cardiovascular: Negative.   Gastrointestinal: Negative.   Genitourinary: Negative.    Musculoskeletal: Negative.   Skin: Negative.   Neurological: Negative.   Endo/Heme/Allergies: Negative.   Psychiatric/Behavioral: Negative.     Height 5\' 6"  (1.676 m), weight 77.1 kg (170 lb). Physical Exam  Constitutional: She appears well-developed.  HENT:  Head: Normocephalic.  Eyes: Pupils are equal, round, and reactive to light.  Neck: Normal range of motion.  Cardiovascular: Normal rate.   Respiratory: Effort normal.  GI: Soft.  Genitourinary: Vagina normal.  Musculoskeletal: Normal range of motion.  Neurological: She is alert.  Skin: Skin is warm.  Psychiatric: She has a normal mood and affect. Her behavior is normal. Judgment and thought content normal.     Assessment/Plan  Now with very small volume recurrence. Given her h/o high grade disease, rec operative cysto / TURBT / Retrogrades / Mitomycin next available. Risks, benefits, alternatives, expected peri-op course and need for possible temporary stents / catheters rediscussed.    [name], MD 01/01/2016, 7:27 AM

## 2016-01-02 ENCOUNTER — Encounter (HOSPITAL_BASED_OUTPATIENT_CLINIC_OR_DEPARTMENT_OTHER): Payer: Self-pay | Admitting: Urology

## 2016-01-02 NOTE — Op Note (Signed)
NAME:  Rhonda Norman, Rhonda Norman NO.:  [id number]  MEDICAL RECORD NO.:  [id number]  LOCATION:                                 FACILITY:  PHYSICIAN:  [name], MD     DATE OF BIRTH:  [birth year]  DATE OF PROCEDURE: 01/01/2016                              OPERATIVE REPORT   PREOPERATIVE DIAGNOSIS:  Small volume recurrent bladder tumor.  PROCEDURE: 1. Transection of bladder tumor, volume small. 2. Bilateral retrograde pyelogram interpretation. 3. Instillation of mitomycin-C chemotherapeutic agent.  ESTIMATED BLOOD LOSS:  Nil.  COMPLICATION:  None.  SPECIMENS: 1. Small bladder dome tumor for permanent pathology. 2. Old resection site, right wall, for permanent pathology.  FINDINGS: 1. Unremarkable bilateral retrograde pyelograms. 2. Very small volume recurrent bladder tumor at dome of the bladder,     less than 2 cm. 3. Small erythema on the medial border of the old right lateral wall     resection site. 4. Complete resolution of all visible papillary tumor, her bladder     erythema following transurethral section.  INDICATION:  Rhonda Norman is a very pleasant 82 year old lady with history of high-grade superficial bladder cancer.  She is status post transurethral resection and induction BCG several years ago.  She was found on surveillance cystoscopy to have a small volume recurrence most recently.  Options were discussed for management including continued surveillance versus transurethral resection for restaging these diagnostic purposes, and she wished to proceed with the latter. Informed consent was obtained and placed in the medical record.  PROCEDURE IN DETAIL:  The patient being Rhonda Norman and procedure being transurethral resection of bladder tumor was confirmed.  Procedure was carried out.  Time-out was performed.  Intravenous antibiotics were administered.  General LMA anesthesia was introduced.  The patient was placed into a low lithotomy  position.  Sterile field was created by prepping and draping the patient's vagina, introitus, and proximal thighs using iodine x3.  Next, cystourethroscopy was performed using a 21-French rigid cystoscope with offset lens.  Inspection of the bladder revealed no diverticula, calcifications.  An old resection site was noted just lateral to the right ureteral orifice.  There was some very small erythema at the medial border of this, somewhat worrisome for possible early recurrence or carcinoma in situ.  There was also very small papillary tumor at the dome.  Total volume less than 2 cm.  The dome tumor was of a size and location that it was felt that the safest means of resection would be cold cup based.  As such cold cup biopsy forceps were used to grasp the small tumor in question and was completely removed in excisional biopsy type fashion taking great care to avoid bladder perforation which did not occur.  The resectoscope loop was used to fulgurate the base of this in the edges which resulted in excellent hemostasis and again no evidence of bladder perforation.  Next cold cup biopsy forceps were used to further biopsy the area of erythema on the medial border of the old resection site.  This was set aside as a separate specimen.  The base and edges of this were fulgurated, a total area of approximately 1.5 sq cm.  Ureteral orifices were in their anatomic position.  Attention was then directed at bilateral retrograde pyelograms.  The right ureteral orifice was cannulated with a 6-French end-hole catheter, and right retrograde pyelogram was obtained.  Right retrograde pyelogram demonstrated a single right ureter with single-system right kidney.  No filling defects or narrowing noted. Similarly, left retrograde pyelogram was obtained.  Left retrograde pyelogram demonstrated a single left ureter with single- system left kidney.  No filling defects or narrowing noted.  Following these  maneuvers, excellent hemostasis, complete resolution of all visible bladder erythema, her tumor, bladder was emptied with a 16- [place] Foley catheter which was then plugged.  The patient was transferred to the postanesthesia care unit at which point, 40 mg of mitomycin-C and 40 mL of solution was instilled, left in place for an hour, and then drained.  The patient tolerated the procedure well.  No immediate periprocedural complications.  The patient will be discharged, and we will collaborate with her final pathology.    ______________________________ [name], MD   ______________________________ [name], MD    TM/MEDQ  D:  01/01/2016  T:  01/02/2016  Job:  [id number]

## 2016-04-01 ENCOUNTER — Telehealth: Payer: Self-pay | Admitting: Internal Medicine

## 2016-04-01 NOTE — Telephone Encounter (Signed)
Called patient to schedule annual wellness appt. Left msg for patient to call office to schedule appt.  °

## 2016-05-18 ENCOUNTER — Ambulatory Visit (INDEPENDENT_AMBULATORY_CARE_PROVIDER_SITE_OTHER): Payer: Medicare Other

## 2016-05-18 ENCOUNTER — Ambulatory Visit (INDEPENDENT_AMBULATORY_CARE_PROVIDER_SITE_OTHER): Payer: Medicare Other | Admitting: Orthopaedic Surgery

## 2016-05-18 VITALS — Ht 66.0 in | Wt 168.0 lb

## 2016-05-18 DIAGNOSIS — G8929 Other chronic pain: Secondary | ICD-10-CM

## 2016-05-18 DIAGNOSIS — M25512 Pain in left shoulder: Secondary | ICD-10-CM

## 2016-05-18 DIAGNOSIS — M25511 Pain in right shoulder: Secondary | ICD-10-CM

## 2016-05-18 DIAGNOSIS — M1711 Unilateral primary osteoarthritis, right knee: Secondary | ICD-10-CM | POA: Diagnosis not present

## 2016-05-18 NOTE — Progress Notes (Signed)
Office Visit Note   Patient: Rhonda Norman           Date of Birth: July 16, 1932           MRN: [id number] Visit Date: 05/18/2016              Requested by: Biagio Borg, MD [address], [place] [address] PCP: Cathlean Cower, MD  Chief Complaint  Patient presents with  . Right Knee - Pain    HPI: Patient complains of right knee pain. States that she has bone on bone contact as per an x ray several years ago. She states that she has painful ambulation and that the pain is getting worse. She also complains of bilateral biceps tendon pain. She states that her arms " crack and catch and not sleep well at night" I asked the pt to point to the location of her pain and she points to her bicep not her shoulder or elbow. She states that this is pain bilaterally and she wants this checked today. Pamella Pert, RMA    Assessment & Plan: Visit Diagnoses:  1. Primary osteoarthritis of right knee   2. Chronic pain of both shoulders     Plan: Patient has medial compartment osteoarthritis that has failed conservative treatment. We discussed at length partial knee replacement and the associated risks benefits alternatives to surgery. She understands this and will think about this and will let us know what she decides. We'll see her back as needed.  Follow-Up Instructions: No Follow-up on file.   Ortho Exam Exam of the right knee shows intact collateral and cruciate's. No joint effusion. Good range of motion. Medial joint line tenderness.  Imaging: No results found.  Orders:  Orders Placed This Encounter  Procedures  . XR Knee 1-2 Views Right  . XR Shoulder Right  . XR Shoulder Left   No orders of the defined types were placed in this encounter.    Procedures: No procedures performed  Clinical Data: No additional findings.  Subjective: Review of Systems  Objective: Vital Signs: Ht 5\' 6"  (1.676 m)   Wt 168 lb (76.2 kg)   BMI 27.12 kg/m   Specialty  Comments:  No specialty comments available.  PMFS History: Patient Active Problem List   Diagnosis Date Noted  . Dysuria 09/01/2015  . Poison oak 09/01/2015  . PAT (paroxysmal atrial tachycardia) ([place]) 07/11/2015  . Bladder cancer ([place]) 02/12/2015  . Back pain 02/12/2015  . Hematuria 06/29/2013  . Diabetes ([place]) 06/02/2012  . Degenerative joint disease of knee, right 06/01/2012  . NSAID long-term use 06/01/2012  . CAD (coronary artery disease)   . Preventative health care 06/12/2011  . GERD 06/30/2010  . ANEMIA-IRON DEFICIENCY 06/19/2008  . Hyperlipidemia 05/27/2007  . ANXIETY 05/27/2007  . DEPRESSION 05/27/2007  . Essential hypertension 05/27/2007   Past Medical History:  Diagnosis Date  . Anxiety   . Arthritis    DJD-right knee  . Atrial tachycardia, paroxysmal ([place])   . Bilateral renal cysts    non-complex  . Bladder cancer [facility]) urologist- dr Tammi Klippel   now recurrent  w/ hx high grade superficial carcinoma in 2015 s/p TURBT and chemo instillation  . CAD (coronary artery disease)    non obstructive, [name], april 2010  . Depression   . Full dentures   . GERD (gastroesophageal reflux disease)   . History of iron deficiency anemia   . Hyperlipidemia   . Hypertension   .  Type 2 diabetes, diet controlled ([place])     Family History  Problem Relation Age of Onset  . Cancer Mother   . Hypertension Father     Past Surgical History:  Procedure Laterality Date  . ABDOMINAL HYSTERECTOMY  1970's   w/ bilateral salpingoophorectomy  . CARDIAC CATHETERIZATION  08-22-2008  dr Cathie Olden   mild to moderate coronary artery irregularies/  dRCA 30%/  ef 60%/  possible coronary spasm  . CARDIOVASCULAR STRESS TEST  08-15-2008  dr Cathie Olden   abnormal stress dual isotope/  BP drop following exercise is concern/ no evidence ischenmia/ LV function normal/ ef 74%  . CATARACT EXTRACTION W/ INTRAOCULAR LENS  IMPLANT, BILATERAL  2013  . CYSTOSCOPY W/ RETROGRADES Bilateral 02/21/2014    Procedure: CYSTOSCOPY WITH RETROGRADE PYELOGRAM, Bilateral ;  Surgeon: [name], MD;  Location: [facility];  Service: Urology;  Laterality: Bilateral;  . CYSTOSCOPY W/ RETROGRADES Bilateral 01/01/2016   Procedure: CYSTOSCOPY WITH RETROGRADE PYELOGRAM;  Surgeon: [name], MD;  Location: [facility];  Service: Urology;  Laterality: Bilateral;  . CYSTOSCOPY W/ URETERAL STENT PLACEMENT Bilateral 01/04/2014   Procedure: CYSTOSCOPY WITH bilateral RETROGRADE PYELOGRAM;  Surgeon: [name], MD;  Location: [facility];  Service: Urology;  Laterality: Bilateral;  . TRANSURETHRAL RESECTION OF BLADDER TUMOR N/A 01/01/2016   Procedure: TRANSURETHRAL RESECTION OF BLADDER TUMOR (TURBT) WITH MITOMYCIN;  Surgeon: [name], MD;  Location: [facility];  Service: Urology;  Laterality: N/A;  . TRANSURETHRAL RESECTION OF BLADDER TUMOR WITH GYRUS (TURBT-GYRUS) N/A 01/04/2014   Procedure: TRANSURETHRAL RESECTION OF BLADDER TUMOR WITH GYRUS (TURBT-GYRUS);  Surgeon: [name], MD;  Location: [facility];  Service: Urology;  Laterality: N/A;  . TRANSURETHRAL RESECTION OF BLADDER TUMOR WITH GYRUS (TURBT-GYRUS) N/A 02/21/2014   Procedure: TRANSURETHRAL RESECTION OF BLADDER TUMOR WITH GYRUS (TURBT-GYRUS);  Surgeon: [name], MD;  Location: [facility];  Service: Urology;  Laterality: N/A;   Social History   Occupational History  . Not on file.   Social History Main Topics  . Smoking status: Never Smoker  . Smokeless tobacco: Never Used  . Alcohol use Yes     Comment: social  . Drug use: No  . Sexual activity: Not on file

## 2016-05-26 ENCOUNTER — Other Ambulatory Visit: Payer: Self-pay | Admitting: Internal Medicine

## 2016-05-26 NOTE — Telephone Encounter (Signed)
seroquel done erx 

## 2016-05-28 ENCOUNTER — Other Ambulatory Visit (INDEPENDENT_AMBULATORY_CARE_PROVIDER_SITE_OTHER): Payer: Self-pay | Admitting: Orthopaedic Surgery

## 2016-05-28 DIAGNOSIS — M1711 Unilateral primary osteoarthritis, right knee: Secondary | ICD-10-CM

## 2016-06-03 NOTE — Pre-Procedure Instructions (Signed)
    [place]  06/03/2016      [place], [place] [id number] [place] [place] [address] Phone: [phone] Fax: [phone]    Your procedure is scheduled on : Thursday, February 15th   Report to [facility] Admitting at 10:15 AM.             (posted surgery time 12:15 pm - 2:15 pm) .  Call this number if you have problems the MORNING of surgery:  [phone].  [facility] is closed on weekends.   Remember:  Do not eat food or drink liquids after midnight Wednesday.   Take these medicines the morning of surgery with A SIP OF WATER : Amlodipine, Metoprolol,  Tramadol   Do not wear jewelry, make-up or nail polish.  Do not wear lotions, powders, perfumes, or deoderant.  Do not shave underarms & legs 48 hours prior to surgery.     Do not bring valuables to the hospital.             [facility] is not responsible for any belongings or valuables.  Contacts, dentures or bridgework may not be worn into surgery.  Leave your suitcase in the car.  After surgery it may be brought to your room.  For patients admitted to the hospital, discharge time will be determined by your treatment team.  Please read over the following fact sheets that you were given. Pain Booklet, MRSA Information and Surgical Site Infection Prevention

## 2016-06-04 ENCOUNTER — Encounter (HOSPITAL_COMMUNITY)
Admission: RE | Admit: 2016-06-04 | Discharge: 2016-06-04 | Disposition: A | Discharge status: Home / Self Care | Payer: Medicare Other | Source: Ambulatory Visit | Attending: Orthopaedic Surgery | Admitting: Orthopaedic Surgery

## 2016-06-04 ENCOUNTER — Encounter (HOSPITAL_COMMUNITY): Payer: Self-pay

## 2016-06-04 DIAGNOSIS — Z01812 Encounter for preprocedural laboratory examination: Secondary | ICD-10-CM | POA: Insufficient documentation

## 2016-06-04 HISTORY — DX: Prediabetes: R73.03

## 2016-06-04 LAB — CBC WITH DIFFERENTIAL/PLATELET
BASOS ABS: 0 10*3/uL (ref 0.0–0.1)
Basophils Relative: 1 %
Eosinophils Absolute: 0.1 10*3/uL (ref 0.0–0.7)
Eosinophils Relative: 1 %
HEMATOCRIT: 41.5 % (ref 36.0–46.0)
Hemoglobin: 13.6 g/dL (ref 12.0–15.0)
LYMPHS PCT: 29 %
Lymphs Abs: 2.3 10*3/uL (ref 0.7–4.0)
MCH: 29.6 pg (ref 26.0–34.0)
MCHC: 32.8 g/dL (ref 30.0–36.0)
MCV: 90.2 fL (ref 78.0–100.0)
MONO ABS: 0.6 10*3/uL (ref 0.1–1.0)
Monocytes Relative: 8 %
NEUTROS ABS: 4.8 10*3/uL (ref 1.7–7.7)
Neutrophils Relative %: 61 %
Platelets: 215 10*3/uL (ref 150–400)
RBC: 4.6 MIL/uL (ref 3.87–5.11)
RDW: 13.3 % (ref 11.5–15.5)
WBC: 7.8 10*3/uL (ref 4.0–10.5)

## 2016-06-04 LAB — URINALYSIS, ROUTINE W REFLEX MICROSCOPIC
BILIRUBIN URINE: NEGATIVE
Glucose, UA: 50 mg/dL — AB
HGB URINE DIPSTICK: NEGATIVE
Ketones, ur: NEGATIVE mg/dL
NITRITE: NEGATIVE
PROTEIN: NEGATIVE mg/dL
Specific Gravity, Urine: 1.018 (ref 1.005–1.030)
pH: 6 (ref 5.0–8.0)

## 2016-06-04 LAB — COMPREHENSIVE METABOLIC PANEL
ALT: 21 U/L (ref 14–54)
AST: 27 U/L (ref 15–41)
Albumin: 4.4 g/dL (ref 3.5–5.0)
Alkaline Phosphatase: 78 U/L (ref 38–126)
Anion gap: 11 (ref 5–15)
BILIRUBIN TOTAL: 0.6 mg/dL (ref 0.3–1.2)
BUN: 20 mg/dL (ref 6–20)
CO2: 26 mmol/L (ref 22–32)
CREATININE: 1.07 mg/dL — AB (ref 0.44–1.00)
Calcium: 9.8 mg/dL (ref 8.9–10.3)
Chloride: 105 mmol/L (ref 101–111)
GFR calc Af Amer: 55 mL/min — ABNORMAL LOW (ref >60)
GFR, EST NON AFRICAN AMERICAN: 47 mL/min — AB (ref >60)
Glucose, Bld: 94 mg/dL (ref 65–99)
POTASSIUM: 3.8 mmol/L (ref 3.5–5.1)
Sodium: 142 mmol/L (ref 135–145)
TOTAL PROTEIN: 6.7 g/dL (ref 6.5–8.1)

## 2016-06-04 LAB — TYPE AND SCREEN
ABO/RH(D): O POS
Antibody Screen: NEGATIVE

## 2016-06-04 LAB — SURGICAL PCR SCREEN
MRSA, PCR: NEGATIVE
Staphylococcus aureus: NEGATIVE

## 2016-06-04 LAB — PROTIME-INR
INR: 1
PROTHROMBIN TIME: 13.2 s (ref 11.4–15.2)

## 2016-06-04 LAB — SEDIMENTATION RATE: Sed Rate: 15 mm/hr (ref 0–22)

## 2016-06-04 LAB — APTT: APTT: 29 s (ref 24–36)

## 2016-06-04 LAB — C-REACTIVE PROTEIN: CRP: <0.8 mg/dL (ref <1.0)

## 2016-06-04 LAB — ABO/RH: ABO/RH(D): O POS

## 2016-06-04 NOTE — Progress Notes (Signed)
PCP is Dr. Cathlean Cower  LOV April 2017 Was implied that she was "pre diabetic, A1C 6.3 in April 2017.  She doesn't check her sugars, no takes medicines. Did see Dr. Acie Fredrickson back in 2010, had heart cath, 'mild to moderate disease" no other action taken.  Feels fine.   Has had facial cancer removed and has had bladder cancer and surgery x 3 with Dr. Tammi Klippel.

## 2016-06-05 LAB — HEMOGLOBIN A1C
HEMOGLOBIN A1C: 5.9 % — AB (ref 4.8–5.6)
Mean Plasma Glucose: 123 mg/dL

## 2016-06-10 MED ORDER — BUPIVACAINE LIPOSOME 1.3 % IJ SUSP
20.0000 mL | Freq: Once | INTRAMUSCULAR | Status: DC
  Filled 2016-06-10: qty 20

## 2016-06-10 MED ORDER — SODIUM CHLORIDE 0.9 % IV SOLN
1000.0000 mg | INTRAVENOUS | Status: AC
  Administered 2016-06-11: 1000 mg via INTRAVENOUS
  Filled 2016-06-10: qty 10

## 2016-06-10 MED ORDER — CLINDAMYCIN PHOSPHATE 900 MG/50ML IV SOLN
900.0000 mg | INTRAVENOUS | Status: AC
  Administered 2016-06-11: 900 mg via INTRAVENOUS
  Filled 2016-06-10 (×2): qty 50

## 2016-06-11 ENCOUNTER — Ambulatory Visit (HOSPITAL_COMMUNITY): Payer: Medicare Other | Admitting: Anesthesiology

## 2016-06-11 ENCOUNTER — Observation Stay (HOSPITAL_COMMUNITY)
Admission: RE | Admit: 2016-06-11 | Discharge: 2016-06-12 | Disposition: A | Discharge status: Home / Self Care | Payer: Medicare Other | Source: Ambulatory Visit | Attending: Orthopaedic Surgery | Admitting: Orthopaedic Surgery

## 2016-06-11 ENCOUNTER — Encounter (HOSPITAL_COMMUNITY)
Admission: RE | Disposition: A | Discharge status: Home / Self Care | Payer: Self-pay | Source: Ambulatory Visit | Attending: Orthopaedic Surgery

## 2016-06-11 DIAGNOSIS — Z7982 Long term (current) use of aspirin: Secondary | ICD-10-CM | POA: Insufficient documentation

## 2016-06-11 DIAGNOSIS — F329 Major depressive disorder, single episode, unspecified: Secondary | ICD-10-CM | POA: Diagnosis not present

## 2016-06-11 DIAGNOSIS — E785 Hyperlipidemia, unspecified: Secondary | ICD-10-CM | POA: Diagnosis not present

## 2016-06-11 DIAGNOSIS — Z9221 Personal history of antineoplastic chemotherapy: Secondary | ICD-10-CM | POA: Diagnosis not present

## 2016-06-11 DIAGNOSIS — M1711 Unilateral primary osteoarthritis, right knee: Secondary | ICD-10-CM | POA: Diagnosis not present

## 2016-06-11 DIAGNOSIS — R7303 Prediabetes: Secondary | ICD-10-CM | POA: Insufficient documentation

## 2016-06-11 DIAGNOSIS — Z880 Allergy status to penicillin: Secondary | ICD-10-CM | POA: Insufficient documentation

## 2016-06-11 DIAGNOSIS — I2510 Atherosclerotic heart disease of native coronary artery without angina pectoris: Secondary | ICD-10-CM | POA: Diagnosis not present

## 2016-06-11 DIAGNOSIS — I10 Essential (primary) hypertension: Secondary | ICD-10-CM | POA: Insufficient documentation

## 2016-06-11 DIAGNOSIS — F419 Anxiety disorder, unspecified: Secondary | ICD-10-CM | POA: Insufficient documentation

## 2016-06-11 DIAGNOSIS — I471 Supraventricular tachycardia: Secondary | ICD-10-CM | POA: Insufficient documentation

## 2016-06-11 DIAGNOSIS — M25661 Stiffness of right knee, not elsewhere classified: Secondary | ICD-10-CM

## 2016-06-11 DIAGNOSIS — Z885 Allergy status to narcotic agent status: Secondary | ICD-10-CM | POA: Diagnosis not present

## 2016-06-11 DIAGNOSIS — Z8551 Personal history of malignant neoplasm of bladder: Secondary | ICD-10-CM | POA: Insufficient documentation

## 2016-06-11 DIAGNOSIS — R262 Difficulty in walking, not elsewhere classified: Secondary | ICD-10-CM

## 2016-06-11 DIAGNOSIS — Z96651 Presence of right artificial knee joint: Secondary | ICD-10-CM

## 2016-06-11 DIAGNOSIS — K219 Gastro-esophageal reflux disease without esophagitis: Secondary | ICD-10-CM | POA: Diagnosis not present

## 2016-06-11 HISTORY — PX: PARTIAL KNEE ARTHROPLASTY: SHX2174

## 2016-06-11 SURGERY — ARTHROPLASTY, KNEE, UNICOMPARTMENTAL
Anesthesia: General | Site: Knee | Laterality: Right

## 2016-06-11 MED ORDER — METOCLOPRAMIDE HCL 5 MG/ML IJ SOLN: 5.0000 mg | Freq: Three times a day (TID) | INTRAMUSCULAR | Status: DC | PRN

## 2016-06-11 MED ORDER — SODIUM CHLORIDE 0.9 % IR SOLN
Status: DC | PRN
  Administered 2016-06-11: 3000 mL

## 2016-06-11 MED ORDER — ASPIRIN EC 325 MG PO TBEC
325.0000 mg | DELAYED_RELEASE_TABLET | Freq: Two times a day (BID) | ORAL | Status: DC
  Administered 2016-06-11 – 2016-06-12 (×2): 325 mg via ORAL
  Filled 2016-06-11 (×2): qty 1

## 2016-06-11 MED ORDER — ROCURONIUM BROMIDE 50 MG/5ML IV SOSY
PREFILLED_SYRINGE | INTRAVENOUS | Status: AC
  Filled 2016-06-11: qty 5

## 2016-06-11 MED ORDER — PHENYLEPHRINE 40 MCG/ML (10ML) SYRINGE FOR IV PUSH (FOR BLOOD PRESSURE SUPPORT)
PREFILLED_SYRINGE | INTRAVENOUS | Status: AC
  Filled 2016-06-11: qty 10

## 2016-06-11 MED ORDER — ACETAMINOPHEN 500 MG PO TABS
1000.0000 mg | ORAL_TABLET | Freq: Four times a day (QID) | ORAL | Status: AC
  Administered 2016-06-11 – 2016-06-12 (×4): 1000 mg via ORAL
  Filled 2016-06-11 (×3): qty 2

## 2016-06-11 MED ORDER — FENTANYL CITRATE (PF) 100 MCG/2ML IJ SOLN
INTRAMUSCULAR | Status: DC | PRN
  Administered 2016-06-11: 25 ug via INTRAVENOUS
  Administered 2016-06-11: 100 ug via INTRAVENOUS

## 2016-06-11 MED ORDER — LIDOCAINE HCL (CARDIAC) 20 MG/ML IV SOLN
INTRAVENOUS | Status: DC | PRN
  Administered 2016-06-11: 60 mg via INTRAVENOUS

## 2016-06-11 MED ORDER — CLINDAMYCIN PHOSPHATE 600 MG/50ML IV SOLN
600.0000 mg | Freq: Four times a day (QID) | INTRAVENOUS | Status: AC
  Administered 2016-06-11 – 2016-06-12 (×2): 600 mg via INTRAVENOUS
  Filled 2016-06-11 (×2): qty 50

## 2016-06-11 MED ORDER — PROPOFOL 10 MG/ML IV BOLUS
INTRAVENOUS | Status: DC | PRN
  Administered 2016-06-11: 100 mg via INTRAVENOUS

## 2016-06-11 MED ORDER — HYDROMORPHONE HCL 2 MG PO TABS
2.0000 mg | ORAL_TABLET | ORAL | Status: DC | PRN
  Administered 2016-06-11 – 2016-06-12 (×2): 2 mg via ORAL
  Filled 2016-06-11 (×2): qty 1

## 2016-06-11 MED ORDER — ACETAMINOPHEN 650 MG RE SUPP: 650.0000 mg | Freq: Four times a day (QID) | RECTAL | Status: DC | PRN

## 2016-06-11 MED ORDER — METHOCARBAMOL 1000 MG/10ML IJ SOLN
500.0000 mg | Freq: Four times a day (QID) | INTRAMUSCULAR | Status: DC | PRN
  Filled 2016-06-11: qty 5

## 2016-06-11 MED ORDER — METOPROLOL TARTRATE 12.5 MG HALF TABLET
12.5000 mg | ORAL_TABLET | Freq: Two times a day (BID) | ORAL | Status: DC
  Administered 2016-06-11 – 2016-06-12 (×2): 12.5 mg via ORAL
  Filled 2016-06-11 (×2): qty 1

## 2016-06-11 MED ORDER — 0.9 % SODIUM CHLORIDE (POUR BTL) OPTIME
TOPICAL | Status: DC | PRN
  Administered 2016-06-11: 1000 mL

## 2016-06-11 MED ORDER — OMEGA-3-ACID ETHYL ESTERS 1 G PO CAPS
2.0000 g | ORAL_CAPSULE | Freq: Two times a day (BID) | ORAL | Status: DC
  Administered 2016-06-12: 2 g via ORAL
  Filled 2016-06-11: qty 2

## 2016-06-11 MED ORDER — LACTATED RINGERS IV SOLN
INTRAVENOUS | Status: DC | PRN
  Administered 2016-06-11 (×2): via INTRAVENOUS

## 2016-06-11 MED ORDER — MORPHINE SULFATE (PF) 2 MG/ML IV SOLN: 1.0000 mg | INTRAVENOUS | Status: DC | PRN

## 2016-06-11 MED ORDER — POLYETHYLENE GLYCOL 3350 17 G PO PACK: 17.0000 g | PACK | Freq: Every day | ORAL | Status: DC | PRN

## 2016-06-11 MED ORDER — QUETIAPINE FUMARATE 50 MG PO TABS
50.0000 mg | ORAL_TABLET | Freq: Every evening | ORAL | Status: DC | PRN
  Administered 2016-06-11: 50 mg via ORAL
  Filled 2016-06-11 (×2): qty 1

## 2016-06-11 MED ORDER — HYDROMORPHONE HCL 2 MG PO TABS: 2.0000 mg | ORAL_TABLET | ORAL | 0 refills | Status: DC | PRN

## 2016-06-11 MED ORDER — PHENOL 1.4 % MT LIQD: 1.0000 | OROMUCOSAL | Status: DC | PRN

## 2016-06-11 MED ORDER — PHENYLEPHRINE HCL 10 MG/ML IJ SOLN
INTRAVENOUS | Status: DC | PRN
  Administered 2016-06-11: 25 ug/min via INTRAVENOUS

## 2016-06-11 MED ORDER — FENTANYL CITRATE (PF) 100 MCG/2ML IJ SOLN
INTRAMUSCULAR | Status: AC
  Filled 2016-06-11: qty 2

## 2016-06-11 MED ORDER — SUGAMMADEX SODIUM 200 MG/2ML IV SOLN
INTRAVENOUS | Status: AC
  Filled 2016-06-11: qty 2

## 2016-06-11 MED ORDER — ALUM & MAG HYDROXIDE-SIMETH 200-200-20 MG/5ML PO SUSP: 30.0000 mL | ORAL | Status: DC | PRN

## 2016-06-11 MED ORDER — SENNOSIDES-DOCUSATE SODIUM 8.6-50 MG PO TABS: 1.0000 | ORAL_TABLET | Freq: Every evening | ORAL | 1 refills | Status: DC | PRN

## 2016-06-11 MED ORDER — KETOROLAC TROMETHAMINE 30 MG/ML IJ SOLN
INTRAMUSCULAR | Status: AC
  Filled 2016-06-11: qty 1

## 2016-06-11 MED ORDER — SUGAMMADEX SODIUM 200 MG/2ML IV SOLN
INTRAVENOUS | Status: DC | PRN
  Administered 2016-06-11: 155.2 mg via INTRAVENOUS

## 2016-06-11 MED ORDER — ATORVASTATIN CALCIUM 10 MG PO TABS
10.0000 mg | ORAL_TABLET | Freq: Every day | ORAL | Status: DC
  Administered 2016-06-11: 10 mg via ORAL
  Filled 2016-06-11: qty 1

## 2016-06-11 MED ORDER — ONDANSETRON HCL 4 MG/2ML IJ SOLN
INTRAMUSCULAR | Status: AC
  Filled 2016-06-11: qty 6

## 2016-06-11 MED ORDER — ACETAMINOPHEN 500 MG PO TABS
ORAL_TABLET | ORAL | Status: AC
  Filled 2016-06-11: qty 2

## 2016-06-11 MED ORDER — DIPHENHYDRAMINE HCL 12.5 MG/5ML PO ELIX
25.0000 mg | ORAL_SOLUTION | ORAL | Status: DC | PRN
Start: 2016-06-11 — End: 2016-06-12

## 2016-06-11 MED ORDER — ONDANSETRON HCL 4 MG PO TABS: 4.0000 mg | ORAL_TABLET | Freq: Four times a day (QID) | ORAL | Status: DC | PRN

## 2016-06-11 MED ORDER — BUPIVACAINE LIPOSOME 1.3 % IJ SUSP
INTRAMUSCULAR | Status: DC | PRN
  Administered 2016-06-11: 20 mL

## 2016-06-11 MED ORDER — METOCLOPRAMIDE HCL 5 MG PO TABS: 5.0000 mg | ORAL_TABLET | Freq: Three times a day (TID) | ORAL | Status: DC | PRN

## 2016-06-11 MED ORDER — AMLODIPINE BESYLATE 5 MG PO TABS
5.0000 mg | ORAL_TABLET | Freq: Every day | ORAL | Status: DC
  Administered 2016-06-12: 5 mg via ORAL
  Filled 2016-06-11: qty 1

## 2016-06-11 MED ORDER — DEXAMETHASONE SODIUM PHOSPHATE 10 MG/ML IJ SOLN
10.0000 mg | Freq: Once | INTRAMUSCULAR | Status: AC
  Administered 2016-06-12: 10 mg via INTRAVENOUS
  Filled 2016-06-11: qty 1

## 2016-06-11 MED ORDER — IRBESARTAN 300 MG PO TABS
300.0000 mg | ORAL_TABLET | Freq: Every day | ORAL | Status: DC
  Administered 2016-06-12: 300 mg via ORAL
  Filled 2016-06-11: qty 1

## 2016-06-11 MED ORDER — MIDAZOLAM HCL 2 MG/2ML IJ SOLN
INTRAMUSCULAR | Status: AC
  Administered 2016-06-11: 1 mg
  Filled 2016-06-11: qty 2

## 2016-06-11 MED ORDER — PROMETHAZINE HCL 25 MG PO TABS: 25.0000 mg | ORAL_TABLET | Freq: Four times a day (QID) | ORAL | 1 refills | Status: DC | PRN

## 2016-06-11 MED ORDER — SORBITOL 70 % SOLN: 30.0000 mL | Freq: Every day | Status: DC | PRN

## 2016-06-11 MED ORDER — ONDANSETRON HCL 4 MG PO TABS: 4.0000 mg | ORAL_TABLET | Freq: Three times a day (TID) | ORAL | 0 refills | Status: DC | PRN

## 2016-06-11 MED ORDER — GLYCOPYRROLATE 0.2 MG/ML IJ SOLN
INTRAMUSCULAR | Status: DC | PRN
  Administered 2016-06-11: 0.2 mg via INTRAVENOUS

## 2016-06-11 MED ORDER — SODIUM CHLORIDE 0.9 % IV SOLN
INTRAVENOUS | Status: DC
  Administered 2016-06-11: 16:00:00 via INTRAVENOUS

## 2016-06-11 MED ORDER — LACTATED RINGERS IV SOLN
INTRAVENOUS | Status: DC
  Administered 2016-06-11: 50 mL/h via INTRAVENOUS

## 2016-06-11 MED ORDER — MAGNESIUM CITRATE PO SOLN: 1.0000 | Freq: Once | ORAL | Status: DC | PRN

## 2016-06-11 MED ORDER — POVIDONE-IODINE 10 % EX SOLN: CUTANEOUS | Status: DC | PRN

## 2016-06-11 MED ORDER — FENTANYL CITRATE (PF) 100 MCG/2ML IJ SOLN
INTRAMUSCULAR | Status: AC
  Administered 2016-06-11: 75 ug
  Filled 2016-06-11: qty 2

## 2016-06-11 MED ORDER — KETOROLAC TROMETHAMINE 30 MG/ML IJ SOLN
15.0000 mg | Freq: Four times a day (QID) | INTRAMUSCULAR | Status: DC
  Administered 2016-06-11 – 2016-06-12 (×2): 15 mg via INTRAVENOUS
  Filled 2016-06-11 (×3): qty 1

## 2016-06-11 MED ORDER — TRANEXAMIC ACID 1000 MG/10ML IV SOLN
1000.0000 mg | Freq: Once | INTRAVENOUS | Status: AC
  Administered 2016-06-11: 1000 mg via INTRAVENOUS
  Filled 2016-06-11: qty 10

## 2016-06-11 MED ORDER — METHOCARBAMOL 500 MG PO TABS: 500.0000 mg | ORAL_TABLET | Freq: Four times a day (QID) | ORAL | Status: DC | PRN

## 2016-06-11 MED ORDER — VITAMIN D 1000 UNITS PO TABS
2000.0000 [IU] | ORAL_TABLET | Freq: Every day | ORAL | Status: DC
  Administered 2016-06-12: 2000 [IU] via ORAL
  Filled 2016-06-11 (×2): qty 2

## 2016-06-11 MED ORDER — ONDANSETRON HCL 4 MG/2ML IJ SOLN: 4.0000 mg | Freq: Four times a day (QID) | INTRAMUSCULAR | Status: DC | PRN

## 2016-06-11 MED ORDER — MENTHOL 3 MG MT LOZG
1.0000 | LOZENGE | OROMUCOSAL | Status: DC | PRN
  Administered 2016-06-11: 3 mg via ORAL
  Filled 2016-06-11: qty 9

## 2016-06-11 MED ORDER — ROCURONIUM BROMIDE 100 MG/10ML IV SOLN
INTRAVENOUS | Status: DC | PRN
  Administered 2016-06-11: 30 mg via INTRAVENOUS

## 2016-06-11 MED ORDER — KETOROLAC TROMETHAMINE 15 MG/ML IJ SOLN
30.0000 mg | Freq: Four times a day (QID) | INTRAMUSCULAR | Status: DC | PRN
  Administered 2016-06-11 (×2): 30 mg via INTRAVENOUS
  Filled 2016-06-11: qty 2

## 2016-06-11 MED ORDER — ASPIRIN EC 325 MG PO TBEC: 325.0000 mg | DELAYED_RELEASE_TABLET | Freq: Two times a day (BID) | ORAL | 0 refills | Status: DC

## 2016-06-11 MED ORDER — OMEGA-3 FATTY ACIDS 1000 MG PO CAPS: 2.0000 g | ORAL_CAPSULE | Freq: Every day | ORAL | Status: DC

## 2016-06-11 MED ORDER — PHENYLEPHRINE HCL 10 MG/ML IJ SOLN
INTRAMUSCULAR | Status: DC | PRN
  Administered 2016-06-11: 80 ug via INTRAVENOUS

## 2016-06-11 MED ORDER — METHOCARBAMOL 750 MG PO TABS: 750.0000 mg | ORAL_TABLET | Freq: Two times a day (BID) | ORAL | 0 refills | Status: DC | PRN

## 2016-06-11 MED ORDER — ACETAMINOPHEN 325 MG PO TABS: 650.0000 mg | ORAL_TABLET | Freq: Four times a day (QID) | ORAL | Status: DC | PRN

## 2016-06-11 MED ORDER — SODIUM CHLORIDE 0.9 % IJ SOLN
INTRAMUSCULAR | Status: DC | PRN
  Administered 2016-06-11: 40 mL

## 2016-06-11 MED ORDER — CEFAZOLIN SODIUM-DEXTROSE 2-4 GM/100ML-% IV SOLN: 2.0000 g | Freq: Four times a day (QID) | INTRAVENOUS | Status: DC

## 2016-06-11 MED ORDER — ONDANSETRON HCL 4 MG/2ML IJ SOLN
INTRAMUSCULAR | Status: DC | PRN
  Administered 2016-06-11: 4 mg via INTRAVENOUS

## 2016-06-11 MED ORDER — PROPOFOL 10 MG/ML IV BOLUS
INTRAVENOUS | Status: AC
  Filled 2016-06-11: qty 20

## 2016-06-11 SURGICAL SUPPLY — 79 items
ALCOHOL ISOPROPYL (RUBBING) (MISCELLANEOUS) ×2 IMPLANT
APL SKNCLS STERI-STRIP NONHPOA (GAUZE/BANDAGES/DRESSINGS)
BAG DECANTER FOR FLEXI CONT (MISCELLANEOUS) ×2 IMPLANT
BANDAGE ACE 6X5 VEL STRL LF (GAUZE/BANDAGES/DRESSINGS) ×2 IMPLANT
BANDAGE ESMARK 6X9 LF (GAUZE/BANDAGES/DRESSINGS) ×1 IMPLANT
BENZOIN TINCTURE PRP APPL 2/3 (GAUZE/BANDAGES/DRESSINGS) ×1 IMPLANT
BLADE SAW SGTL 13.0X1.19X90.0M (BLADE) ×2 IMPLANT
BNDG CMPR 9X6 STRL LF SNTH (GAUZE/BANDAGES/DRESSINGS) ×1
BNDG CMPR MED 10X6 ELC LF (GAUZE/BANDAGES/DRESSINGS) ×1
BNDG ELASTIC 6X10 VLCR STRL LF (GAUZE/BANDAGES/DRESSINGS) ×1 IMPLANT
BNDG ESMARK 6X9 LF (GAUZE/BANDAGES/DRESSINGS) ×2
BONE CEMENT PALACOS R-G (Cement) ×2 IMPLANT
BOWL SMART MIX CTS (DISPOSABLE) ×2 IMPLANT
CAPT KNEE PARTIAL 2 ×1 IMPLANT
CEMENT BONE PALACOS R-G (Cement) ×2 IMPLANT
CLSR STERI-STRIP ANTIMIC 1/2X4 (GAUZE/BANDAGES/DRESSINGS) ×4 IMPLANT
COVER SURGICAL LIGHT HANDLE (MISCELLANEOUS) ×2 IMPLANT
CUFF TOURNIQUET SINGLE 34IN LL (TOURNIQUET CUFF) ×2 IMPLANT
CUFF TOURNIQUET SINGLE 44IN (TOURNIQUET CUFF) IMPLANT
DRAPE EXTREMITY T 121X128X90 (DRAPE) ×2 IMPLANT
DRAPE INCISE IOBAN 66X45 STRL (DRAPES) IMPLANT
DRAPE ORTHO SPLIT 77X108 STRL (DRAPES) ×4
DRAPE PROXIMA HALF (DRAPES) ×2 IMPLANT
DRAPE SURG 17X11 SM STRL (DRAPES) ×4 IMPLANT
DRAPE SURG ORHT 6 SPLT 77X108 (DRAPES) ×2 IMPLANT
DRSG AQUACEL AG ADV 3.5X10 (GAUZE/BANDAGES/DRESSINGS) ×1 IMPLANT
DRSG AQUACEL AG ADV 3.5X14 (GAUZE/BANDAGES/DRESSINGS) ×1 IMPLANT
DURAPREP 26ML APPLICATOR (WOUND CARE) ×4 IMPLANT
ELECT CAUTERY BLADE 6.4 (BLADE) ×2 IMPLANT
ELECT REM PT RETURN 9FT ADLT (ELECTROSURGICAL) ×2
ELECTRODE REM PT RTRN 9FT ADLT (ELECTROSURGICAL) ×1 IMPLANT
GLOVE BIOGEL PI IND STRL 6.5 (GLOVE) IMPLANT
GLOVE BIOGEL PI INDICATOR 6.5 (GLOVE) ×2
GLOVE SKINSENSE NS SZ7.5 (GLOVE) ×1
GLOVE SKINSENSE STRL SZ7.5 (GLOVE) ×1 IMPLANT
GLOVE SURG SS PI 6.5 STRL IVOR (GLOVE) ×1 IMPLANT
GLOVE SURG SYN 7.5  E (GLOVE) ×4
GLOVE SURG SYN 7.5 E (GLOVE) ×4 IMPLANT
GLOVE SURG SYN 7.5 PF PI (GLOVE) ×4 IMPLANT
GOWN STRL REIN XL XLG (GOWN DISPOSABLE) ×2 IMPLANT
GOWN STRL REUS W/ TWL LRG LVL3 (GOWN DISPOSABLE) ×1 IMPLANT
GOWN STRL REUS W/TWL LRG LVL3 (GOWN DISPOSABLE) ×2
HANDPIECE INTERPULSE COAX TIP (DISPOSABLE) ×2
HOOD PEEL AWAY FLYTE STAYCOOL (MISCELLANEOUS) ×4 IMPLANT
KIT BASIN OR (CUSTOM PROCEDURE TRAY) ×2 IMPLANT
KIT ROOM TURNOVER OR (KITS) ×2 IMPLANT
MANIFOLD NEPTUNE II (INSTRUMENTS) ×2 IMPLANT
NDL SPNL 18GX3.5 QUINCKE PK (NEEDLE) ×1 IMPLANT
NEEDLE SPNL 18GX3.5 QUINCKE PK (NEEDLE) ×2 IMPLANT
NS IRRIG 1000ML POUR BTL (IV SOLUTION) ×2 IMPLANT
PACK BLADE SAW RECIP 70 3 PT (BLADE) ×1 IMPLANT
PACK TOTAL JOINT (CUSTOM PROCEDURE TRAY) ×2 IMPLANT
PAD ARMBOARD 7.5X6 YLW CONV (MISCELLANEOUS) ×4 IMPLANT
PEN SKIN MARKING BROAD (MISCELLANEOUS) ×2 IMPLANT
SAW OSC TIP CART 19.5X105X1.3 (SAW) ×2 IMPLANT
SEALER BIPOLAR AQUA 6.0 (INSTRUMENTS) ×2 IMPLANT
SET HNDPC FAN SPRY TIP SCT (DISPOSABLE) ×1 IMPLANT
STAPLER VISISTAT 35W (STAPLE) IMPLANT
STRIP CLOSURE SKIN 1/2X4 (GAUZE/BANDAGES/DRESSINGS) ×1 IMPLANT
SUCTION FRAZIER HANDLE 10FR (MISCELLANEOUS) ×1
SUCTION TUBE FRAZIER 10FR DISP (MISCELLANEOUS) ×1 IMPLANT
SUT ETHILON 2 0 FS 18 (SUTURE) IMPLANT
SUT MNCRL AB 3-0 PS2 18 (SUTURE) ×1 IMPLANT
SUT MNCRL AB 4-0 PS2 18 (SUTURE) ×1 IMPLANT
SUT VIC AB 0 CT1 27 (SUTURE) ×2
SUT VIC AB 0 CT1 27XBRD ANBCTR (SUTURE) ×2 IMPLANT
SUT VIC AB 1 CTX 27 (SUTURE) ×4 IMPLANT
SUT VIC AB 1 CTX 36 (SUTURE) ×2
SUT VIC AB 1 CTX36XBRD ANBCTR (SUTURE) IMPLANT
SUT VIC AB 2-0 CT1 27 (SUTURE) ×4
SUT VIC AB 2-0 CT1 TAPERPNT 27 (SUTURE) ×3 IMPLANT
SYR 20CC LL (SYRINGE) ×2 IMPLANT
SYR 50ML LL SCALE MARK (SYRINGE) ×2 IMPLANT
TOWEL OR 17X24 6PK STRL BLUE (TOWEL DISPOSABLE) ×2 IMPLANT
TOWEL OR 17X26 10 PK STRL BLUE (TOWEL DISPOSABLE) ×2 IMPLANT
TRAY CATH 16FR W/PLASTIC CATH (SET/KITS/TRAYS/PACK) IMPLANT
UNDERPAD 30X30 (UNDERPADS AND DIAPERS) ×2 IMPLANT
WRAP KNEE MAXI GEL POST OP (GAUZE/BANDAGES/DRESSINGS) ×2 IMPLANT
YANKAUER SUCT BULB TIP NO VENT (SUCTIONS) ×1 IMPLANT

## 2016-06-11 NOTE — Transfer of Care (Signed)
Immediate Anesthesia Transfer of Care Note  Patient: Rhonda Norman  Procedure(s) Performed: Procedure(s): RIGHT UNICOMPARTMENTAL KNEE ARTHROPLASTY (Right)  Patient Location: PACU  Anesthesia Type:GA combined with regional for post-op pain  Level of Consciousness: awake, alert  and oriented  Airway & Oxygen Therapy: Patient Spontanous Breathing and Patient connected to nasal cannula oxygen  Post-op Assessment: Report given to RN and Post -op Vital signs reviewed and stable  Post vital signs: Reviewed and stable  Last Vitals:  Vitals:   06/11/16 1210 06/11/16 1215  BP: (!) 170/73 (!) 170/73  Pulse: 62 81  Resp: 16 18  Temp:      Last Pain:  Vitals:   06/11/16 1025  TempSrc: Oral  PainSc: 6          Complications: No apparent anesthesia complications

## 2016-06-11 NOTE — Discharge Instructions (Signed)

## 2016-06-11 NOTE — Anesthesia Procedure Notes (Signed)
Procedure Name: Intubation Date/Time: 06/11/2016 12:40 PM Performed by: Neldon Newport Pre-anesthesia Checklist: Timeout performed, Patient being monitored, Suction available, Emergency Drugs available and Patient identified Patient Re-evaluated:Patient Re-evaluated prior to inductionOxygen Delivery Method: Circle system utilized Preoxygenation: Pre-oxygenation with 100% oxygen Intubation Type: IV induction Ventilation: Mask ventilation without difficulty Laryngoscope Size: Mac and 3 Grade View: Grade I Tube type: Oral Tube size: 7.0 mm Number of attempts: 1 Placement Confirmation: breath sounds checked- equal and bilateral,  positive ETCO2 and ETT inserted through vocal cords under direct vision Secured at: 21 cm Tube secured with: Tape Dental Injury: Teeth and Oropharynx as per pre-operative assessment

## 2016-06-11 NOTE — H&P (Signed)
PREOPERATIVE H&P  Chief Complaint: right knee medial degenerative joint disease  HPI: Rhonda Norman is a 83 y.o. female who presents for surgical treatment of right knee medial degenerative joint disease.  She denies any changes in medical history.  Past Medical History:  Diagnosis Date  . Anxiety   . Arthritis    DJD-right knee  . Atrial tachycardia, paroxysmal ([place])   . Bladder cancer [facility]) urologist- dr Tammi Klippel   now recurrent  w/ hx high grade superficial carcinoma in 2015 s/p TURBT and chemo instillation  . CAD (coronary artery disease)    non obstructive, [name], april 2010  . Depression   . Full dentures   . GERD (gastroesophageal reflux disease)   . History of iron deficiency anemia   . Hyperlipidemia   . Hypertension   . Pre-diabetes    March 2017  A1C was 6.3.  She takes no meds   Past Surgical History:  Procedure Laterality Date  . ABDOMINAL HYSTERECTOMY  1970's   w/ bilateral salpingoophorectomy  . CARDIAC CATHETERIZATION  08-22-2008  dr Cathie Olden   mild to moderate coronary artery irregularies/  dRCA 30%/  ef 60%/  possible coronary spasm  . CARDIOVASCULAR STRESS TEST  08-15-2008  dr Cathie Olden   abnormal stress dual isotope/  BP drop following exercise is concern/ no evidence ischenmia/ LV function normal/ ef 74%  . CATARACT EXTRACTION W/ INTRAOCULAR LENS  IMPLANT, BILATERAL  2013  . CYSTOSCOPY W/ RETROGRADES Bilateral 02/21/2014   Procedure: CYSTOSCOPY WITH RETROGRADE PYELOGRAM, Bilateral ;  Surgeon: [name], MD;  Location: [facility];  Service: Urology;  Laterality: Bilateral;  . CYSTOSCOPY W/ RETROGRADES Bilateral 01/01/2016   Procedure: CYSTOSCOPY WITH RETROGRADE PYELOGRAM;  Surgeon: [name], MD;  Location: [facility];  Service: Urology;  Laterality: Bilateral;  . CYSTOSCOPY W/ URETERAL STENT PLACEMENT Bilateral 01/04/2014   Procedure: CYSTOSCOPY WITH bilateral RETROGRADE PYELOGRAM;  Surgeon: [name],  MD;  [facility];  Service: Urology;  Laterality: Bilateral;  . TRANSURETHRAL RESECTION OF BLADDER TUMOR N/A 01/01/2016   Procedure: TRANSURETHRAL RESECTION OF BLADDER TUMOR (TURBT) WITH MITOMYCIN;  Surgeon: [name], MD;  Location: [facility];  Service: Urology;  Laterality: N/A;  . TRANSURETHRAL RESECTION OF BLADDER TUMOR WITH GYRUS (TURBT-GYRUS) N/A 01/04/2014   Procedure: TRANSURETHRAL RESECTION OF BLADDER TUMOR WITH GYRUS (TURBT-GYRUS);  Surgeon: [name], MD;  Location: [facility];  Service: Urology;  Laterality: N/A;  . TRANSURETHRAL RESECTION OF BLADDER TUMOR WITH GYRUS (TURBT-GYRUS) N/A 02/21/2014   Procedure: TRANSURETHRAL RESECTION OF BLADDER TUMOR WITH GYRUS (TURBT-GYRUS);  Surgeon: [name], MD;  Location: [facility];  Service: Urology;  Laterality: N/A;   Social History   Social History  . Marital status: Widowed    Spouse name: N/A  . Number of children: N/A  . Years of education: N/A   Social History Main Topics  . Smoking status: Never Smoker  . Smokeless tobacco: Never Used  . Alcohol use Yes     Comment: social  . Drug use: No  . Sexual activity: Not on file   Other Topics Concern  . Not on file   Social History Narrative  . No narrative on file   Family History  Problem Relation Age of Onset  . Cancer Mother   . Hypertension Father    Allergies  Allergen Reactions  . Codeine Nausea And Vomiting  . Penicillins Other (See Comments)    Unknown  childhood reaction   Prior to Admission medications   Medication Sig Start Date End Date Taking? Authorizing Provider  amLODipine (NORVASC) 5 MG tablet TAKE ONE TABLET BY MOUTH ONCE DAILY IN THE MORNING 11/01/15  Yes Biagio Borg, MD  atorvastatin (LIPITOR) 10 MG tablet TAKE ONE TABLET BY MOUTH ONCE DAILY Patient taking differently: TAKE ONE TABLET BY MOUTH ONCE DAILY IN THE EVENING 11/01/15  Yes Biagio Borg, MD  Cholecalciferol  (VITAMIN D3) 2000 units TABS Take 2,000 Units by mouth daily.    Yes Historical Provider, MD  EQ ASPIRIN ADULT LOW DOSE 81 MG EC tablet TAKE ONE TABLET BY MOUTH ONCE DAILY 11/29/15  Yes Biagio Borg, MD  fish oil-omega-3 fatty acids 1000 MG capsule Take 2 g by mouth daily.   Yes Historical Provider, MD  irbesartan (AVAPRO) 300 MG tablet TAKE ONE TABLET BY MOUTH ONCE DAILY Patient taking differently: TAKE ONE TABLET BY MOUTH ONCE DAILY---  TAKES IN AM 06/12/14  Yes Biagio Borg, MD  IRON PO Take 1 tablet by mouth daily.   Yes Historical Provider, MD  metoprolol tartrate (LOPRESSOR) 25 MG tablet Take 0.5 tablets (12.5 mg total) by mouth 2 (two) times daily. 07/18/15  Yes Biagio Borg, MD  Naproxen Sodium (ALEVE) 220 MG CAPS Take 440 mg by mouth daily as needed.    Yes Historical Provider, MD  QUEtiapine (SEROQUEL) 50 MG tablet TAKE ONE TABLET BY MOUTH AT BEDTIME AS NEEDED Patient taking differently: TAKE ONE TABLET BY MOUTH AT BEDTIME AS NEEDED FOR SLEEP 05/26/16  Yes Biagio Borg, MD  traMADol (ULTRAM) 50 MG tablet Take 1 tablet (50 mg total) by mouth every 6 (six) hours as needed for moderate pain. Post-operatively. Patient not taking: Reported on 05/29/2016 01/01/16   [name], MD     Positive ROS: All other systems have been reviewed and were otherwise negative with the exception of those mentioned in the HPI and as above.  Physical Exam: General: Alert, no acute distress Cardiovascular: No pedal edema Respiratory: No cyanosis, no use of accessory musculature GI: abdomen soft Skin: No lesions in the area of chief complaint Neurologic: Sensation intact distally Psychiatric: Patient is competent for consent with normal mood and affect Lymphatic: no lymphedema  MUSCULOSKELETAL: exam stable  Assessment: right knee medial degenerative joint disease  Plan: Plan for Procedure(s): RIGHT UNICOMPARTMENTAL KNEE ARTHROPLASTY  The risks benefits and alternatives were discussed with the patient  including but not limited to the risks of nonoperative treatment, versus surgical intervention including infection, bleeding, nerve injury,  blood clots, cardiopulmonary complications, morbidity, mortality, among others, and they were willing to proceed.   Eduard Roux, MD   06/11/2016 12:19 PM

## 2016-06-11 NOTE — Op Note (Addendum)
Date of Surgery: 06/11/2016  INDICATIONS: The patient is a 83 yo female who presents with right knee medial osteoarthritis;  The patient did consent to the procedure after discussion of the risks and benefits.  PREOPERATIVE DIAGNOSIS: Right knee medial osteoarthritis  POSTOPERATIVE DIAGNOSIS: Same.  PROCEDURE: Right unicompartmental knee arthroplasty  SURGEON: N. Eduard Roux, M.D.  ASSIST: Ky Barban, RNFA.  ANESTHESIA:  general, regional  IV FLUIDS AND URINE: See anesthesia.  ESTIMATED BLOOD LOSS: Minimal mL.  IMPLANTS: Biomet Oxford Femur: Medium Tibia: B Poly: 5 mm  DRAINS: none  COMPLICATIONS: None.  DESCRIPTION OF PROCEDURE: The patient was brought to the operating room and placed supine on the operating table.  The patient had been signed prior to the procedure and this was documented. The patient had the anesthesia placed by the anesthesiologist.  A time-out was performed to confirm that this was the correct patient, site, side and location. The patient had an SCD on the opposite lower extremity. A nonsterile tourniquet was placed on the upper right thigh and the leg was placed in the legholder. The patient did receive antibiotics prior to the incision and was re-dosed during the procedure as needed at indicated intervals.  The patient had the operative extremity prepped and draped in the standard surgical fashion.    The extremity was elevated for exsanguination and the tourniquet was inflated to 350 mmHg. We use a longitudinal incision from the medial border of the patella down to the tibial tubercle. Blunt dissection was taken down to the level of the peritenon. A limited medial parapatellar arthrotomy was created. The infrapatellar fat pad was removed. No soft tissue releases were performed. We removed what we could see of the medial meniscus. We then sized a medium spoon to the patient. We placed the tibial cutting guide to the spoon and clamped it in place. The guide  was placed parallel to the anterior cortex of the tibia and in line with the tibia. We took off the osteophytes from the medial femoral condyle. We then created our sagittal cut just medial to the downslope of the medial tibial eminence. We then protected the MCL with the retractor and completed our transverse tibial cut. The cut tibia was taken out and sized to a size B tibia. The tibia did exhibit classic anterior medial wear pattern. Once this was done we then placed an intramedullary rod down the femur and linked up the femoral cutting guide. With the cutting guide in place we were able to visualize the line we drew down the center of the condyle. After the appropriate holes were drilled we then prepared the femur with the miller. Once this was done we then made our posterior femoral condyle cut. Once this was done we then placed trial components in to assess our gaps. We determined that we needed to take an extra 3 mm from the distal femur in order to balance our gaps. With the gaps balanced we then prepared the rest of the femur and the tibia. We took out the rest of the medial meniscus.  We then began with cement mixing and while this was done we prepared the bony surfaces and we thoroughly irrigated the bony surfaces.  We injected a mixture of 20 mL of external and 40 mL of saline in the posterior capsule and the medial retinaculum.  We then cemented our final implants.  Care was taken to remove all excess cement. We then allowed the cement to dry. After this was done we  then placed the final polyethylene liner in. There was excellent tracking of the implant.  We then closed the arthrotomy with interrupted #1 Vicryl sutures, the subcutaneous layer with interrupted 2-0 Vicryl, the skin with interrupted 2-0 nylon. Sterile dressings were applied. The patient was extubated and transferred to the PACU in stable condition. All sponge counts were correct.  POSTOPERATIVE PLAN: The patient will be admitted  overnight for observation and pain control. The patient will be weightbearing as tolerated. Anticipate being able to discharge the patient in the morning.  Azucena Cecil, MD [facility] [phone] 2:18 PM

## 2016-06-11 NOTE — Anesthesia Procedure Notes (Signed)
Anesthesia Regional Block:  Adductor canal block  Pre-Anesthetic Checklist: ,, timeout performed, Correct Patient, Correct Site, Correct Laterality, Correct Procedure, Correct Position, site marked, Risks and benefits discussed,  Surgical consent,  Pre-op evaluation,  At surgeon's request and post-op pain management  Laterality: Right  Prep: chloraprep       Needles:   Needle Type: Stimulator Needle - 80          Additional Needles:  Procedures: Doppler guided, ultrasound guided (picture in chart) and nerve stimulator Adductor canal block Narrative:  Start time: 06/11/2016 11:50 AM End time: 06/11/2016 12:05 PM Injection made incrementally with aspirations every 5 mL.  Performed by: Personally  Anesthesiologist: Belinda Block

## 2016-06-11 NOTE — Anesthesia Preprocedure Evaluation (Addendum)
Anesthesia Evaluation  Patient identified by MRN, date of birth, ID band Patient awake    Reviewed: Allergy & Precautions, NPO status , Patient's Chart, lab work & pertinent test results  Airway Mallampati: II  TM Distance: >3 FB     Dental   Pulmonary neg pulmonary ROS,    breath sounds clear to auscultation       Cardiovascular hypertension, + CAD   Rhythm:Regular Rate:Normal     Neuro/Psych    GI/Hepatic Neg liver ROS, GERD  ,  Endo/Other  diabetes  Renal/GU negative Renal ROS     Musculoskeletal  (+) Arthritis ,   Abdominal   Peds  Hematology  (+) anemia ,   Anesthesia Other Findings   Reproductive/Obstetrics                             Anesthesia Physical Anesthesia Plan  ASA: III  Anesthesia Plan: General   Post-op Pain Management:  Regional for Post-op pain   Induction: Intravenous  Airway Management Planned: Oral ETT  Additional Equipment:   Intra-op Plan:   Post-operative Plan: Extubation in OR  Informed Consent: I have reviewed the patients History and Physical, chart, labs and discussed the procedure including the risks, benefits and alternatives for the proposed anesthesia with the patient or authorized representative who has indicated his/her understanding and acceptance.   Dental advisory given  Plan Discussed with: CRNA and Anesthesiologist  Anesthesia Plan Comments:         Anesthesia Quick Evaluation

## 2016-06-12 ENCOUNTER — Encounter (HOSPITAL_COMMUNITY): Payer: Self-pay | Admitting: Orthopaedic Surgery

## 2016-06-12 DIAGNOSIS — M1711 Unilateral primary osteoarthritis, right knee: Secondary | ICD-10-CM | POA: Diagnosis not present

## 2016-06-12 LAB — CBC
HCT: 34.2 % — ABNORMAL LOW (ref 36.0–46.0)
Hemoglobin: 11.4 g/dL — ABNORMAL LOW (ref 12.0–15.0)
MCH: 30.1 pg (ref 26.0–34.0)
MCHC: 33.3 g/dL (ref 30.0–36.0)
MCV: 90.2 fL (ref 78.0–100.0)
PLATELETS: 159 10*3/uL (ref 150–400)
RBC: 3.79 MIL/uL — AB (ref 3.87–5.11)
RDW: 13.5 % (ref 11.5–15.5)
WBC: 6.9 10*3/uL (ref 4.0–10.5)

## 2016-06-12 NOTE — Care Management Note (Addendum)
Case Management Note  Patient Details  Name: Rhonda Norman MRN: [id number] Date of Birth: 12-16-1932  Subjective/Objective:   83 yr old female s/p right unicompartmental arthroplasty.                 Action/Plan: Case manager spoke with patient and her daughter concerning discharge plan. She states her mom was preoperatively setup with [facility] and they are fine with that. Patient has 3in1, CM will order a Rolling walker. She will have family support at discharge.    Expected Discharge Date:  06/12/16               Expected Discharge Plan:  [place]  In-House Referral:  NA  Discharge planning Services  CM Consult  Post Acute Care Choice:  Durable Medical Equipment, Home Health Choice offered to:  Adult Children  DME Arranged:  Walker rolling DME Agency:  [place] Arranged:  PT [place] Agency:  Other - See comment ([facility])  Status of Service:  Completed, signed off  If discussed at [place] of Stay Meetings, dates discussed:    Additional Comments:  Ninfa Meeker, RN 06/12/2016, 10:28 AM

## 2016-06-12 NOTE — Evaluation (Signed)
Physical Therapy Evaluation Patient Details Name: Rhonda Norman MRN: [id number] DOB: December 24, 1932 Today's Date: 06/12/2016   History of Present Illness  Pt is an 83 yo female admitted to [facility] on 06/11/16 for right unicompatamental knee arthroplasty. PMH significant for anxiety, OA, DJD, bladder CA, CAD, depression, HLD, HTN.   Clinical Impression  Pt is POD 1 following the above procedure and moving well with therapy. Prior to admission, pt was completely independent and living alone in a single level home. Pt plans to return home with assistance from her daughter upon discharge. Pt is able to perform gait, transfers and stair negotiation with Min A to Supervision and is able to perform knee flexion ROM with measurements noted below. Pt will benefit from HHPT upon discharge and will benefit from continued acute PT services to address below deficits.     Follow Up Recommendations Home health PT    Equipment Recommendations  Rolling walker with 5" wheels    Recommendations for Other Services       Precautions / Restrictions Precautions Precautions: Knee Precaution Booklet Issued: Yes (comment) Precaution Comments: handout given and reviewed no pillow under knee Restrictions Weight Bearing Restrictions: Yes RLE Weight Bearing: Weight bearing as tolerated      Mobility  Bed Mobility                  Transfers Overall transfer level: Needs assistance Equipment used: Rolling walker (2 wheeled) Transfers: Sit to/from Stand Sit to Stand: Supervision         General transfer comment: Supervision from safety from [facility] and [facility]  Ambulation/Gait Ambulation/Gait assistance: Supervision Ambulation Distance (Feet): 250 Feet Assistive device: Rolling walker (2 wheeled) Gait Pattern/deviations: Step-through pattern Gait velocity: decreased Gait velocity interpretation: Below normal speed for age/gender General Gait Details: Cues for seuqencing, reducing ER of RLE with gait  and increased knee flexion during swing phase.   Stairs Stairs: Yes Stairs assistance: Min assist Stair Management: No rails;Backwards;Step to pattern;With walker Number of Stairs: 2 General stair comments: cues for sequencing and Min A to stabilize RW for gait  Wheelchair Mobility    Modified Rankin (Stroke Patients Only)       Balance                                             Pertinent Vitals/Pain Pain Assessment: 0-10 Pain Score: 4  Pain Location: right knee Pain Descriptors / Indicators: Aching;Grimacing;Guarding Pain Intervention(s): Limited activity within patient's tolerance;Monitored during session;Premedicated before session;Ice applied    Home Living Family/patient expects to be discharged to:: Private residence Living Arrangements: Alone Available Help at Discharge: Family;Available 24 hours/day   Home Access: Stairs to enter Entrance Stairs-Rails: None Entrance Stairs-Number of Steps: 2 Home Layout: One level Home Equipment: None;Shower seat      Prior Function Level of Independence: Independent         Comments: was doing all adls and iadls and driving     Hand Dominance   Dominant Hand: Right    Extremity/Trunk Assessment   Upper Extremity Assessment Upper Extremity Assessment: Defer to OT evaluation    Lower Extremity Assessment Lower Extremity Assessment: RLE deficits/detail RLE Deficits / Details: Pt with normal post op pain and weakness. At least 3/5 ankle and 2/5 knee and hip per gross functional assessment       Communication   Communication: No difficulties  Cognition Arousal/Alertness: Awake/alert Behavior During Therapy: WFL for tasks assessed/performed Overall Cognitive Status: Within Functional Limits for tasks assessed                      General Comments General comments (skin integrity, edema, etc.): Daughter present throughout session and will be available at discharge    Exercises  Total Joint Exercises Ankle Circles/Pumps: AROM;Both;20 reps;Supine Quad Sets: AROM;Right;10 reps;Supine Heel Slides: AAROM;Right;10 reps;Supine Straight Leg Raises: AAROM;Right;10 reps;Supine Goniometric ROM: 2-95   Assessment/Plan    PT Assessment Patient needs continued PT services  PT Problem List Decreased strength;Decreased balance;Decreased range of motion;Decreased mobility;Decreased knowledge of use of DME;Pain          PT Treatment Interventions DME instruction;Gait training;Stair training;Functional mobility training;Therapeutic activities;Therapeutic exercise;Balance training;Patient/family education    PT Goals (Current goals can be found in the Care Plan section)  Acute Rehab PT Goals Patient Stated Goal: to get home today PT Goal Formulation: With patient Time For Goal Achievement: 06/19/16 Potential to Achieve Goals: Good    Frequency 7X/week   Barriers to discharge        Co-evaluation               End of Session Equipment Utilized During Treatment: Gait belt Activity Tolerance: Patient tolerated treatment well Patient left: in chair;with call bell/phone within reach;with family/visitor present Nurse Communication: Mobility status    Functional Assessment Tool Used: clinical judgement, mobility assessment Functional Limitation: Mobility: Walking and moving around Mobility: Walking and Moving Around Current Status [phone]): At least 1 percent but less than 20 percent impaired, limited or restricted Mobility: Walking and Moving Around Goal Status [phone]): 0 percent impaired, limited or restricted    Time: [id number] PT Time Calculation (min) (ACUTE ONLY): 36 min   Charges:   PT Evaluation $PT Eval Moderate Complexity: 1 Procedure PT Treatments $Gait Training: 8-22 mins   PT G Codes:   PT G-Codes **NOT FOR INPATIENT CLASS** Functional Assessment Tool Used: clinical judgement, mobility assessment Functional Limitation: Mobility: Walking and  moving around Mobility: Walking and Moving Around Current Status [id number]): At least 1 percent but less than 20 percent impaired, limited or restricted Mobility: Walking and Moving Around Goal Status [phone]): 0 percent impaired, limited or restricted    Scheryl Marten PT, DPT  [phone]  06/12/2016, 9:54 AM

## 2016-06-12 NOTE — Care Management Obs Status (Signed)
[place] NOTIFICATION   Patient Details  Name: Rhonda Norman MRN: [id number] Date of Birth: [birth year]   Medicare Observation Status Notification Given:  Yes    Ninfa Meeker, RN 06/12/2016, 11:11 AM

## 2016-06-12 NOTE — Discharge Summary (Signed)
Physician Discharge Summary      Patient ID: Rhonda Norman MRN: [id number] DOB/AGE: October 22, 1932 83 y.o.  Admit date: 06/11/2016 Discharge date: 06/12/2016  Admission Diagnoses:  <principal problem not specified>  Discharge Diagnoses:  Active Problems:   S/P right unicompartmental knee replacement   Past Medical History:  Diagnosis Date  . Anxiety   . Arthritis    DJD-right knee  . Atrial tachycardia, paroxysmal ([place])   . Bladder cancer [facility]) urologist- dr Tammi Klippel   now recurrent  w/ hx high grade superficial carcinoma in 2015 s/p TURBT and chemo instillation  . CAD (coronary artery disease)    non obstructive, [name], april 2010  . Depression   . Full dentures   . GERD (gastroesophageal reflux disease)   . History of iron deficiency anemia   . Hyperlipidemia   . Hypertension   . Pre-diabetes    March 2017  A1C was 6.3.  She takes no meds    Surgeries: Procedure(s): RIGHT UNICOMPARTMENTAL KNEE ARTHROPLASTY on 06/11/2016   Consultants (if any):   Discharged Condition: Improved  Hospital Course: Rhonda Norman is an 83 y.o. female who was admitted 06/11/2016 with a diagnosis of <principal problem not specified> and went to the operating room on 06/11/2016 and underwent the above named procedures.    She was given perioperative antibiotics:  Anti-infectives    Start     Dose/Rate Route Frequency Ordered Stop   06/11/16 1900  clindamycin (CLEOCIN) IVPB 600 mg     600 mg 100 mL/hr over 30 Minutes Intravenous Every 6 hours 06/11/16 1636 06/12/16 0115   06/11/16 1615  ceFAZolin (ANCEF) IVPB 2g/100 mL premix  Status:  Discontinued     2 g 200 mL/hr over 30 Minutes Intravenous Every 6 hours 06/11/16 1602 06/11/16 1636   06/11/16 1200  clindamycin (CLEOCIN) IVPB 900 mg     900 mg 100 mL/hr over 30 Minutes Intravenous To Surgery 06/10/16 0904 06/11/16 1312    .  She was given sequential compression devices, early ambulation, and aspirin for DVT  prophylaxis.  She benefited maximally from the hospital stay and there were no complications.    Recent vital signs:  Vitals:   06/11/16 2134 06/12/16 0500  BP: (!) 142/60 (!) 144/72  Pulse: 74 65  Resp:  17  Temp: 98.1 F (36.7 C) 98.4 F (36.9 C)    Recent laboratory studies:  Lab Results  Component Value Date   HGB 11.4 (L) 06/12/2016   HGB 13.6 06/04/2016   HGB 13.9 01/01/2016   Lab Results  Component Value Date   WBC 6.9 06/12/2016   PLT 159 06/12/2016   Lab Results  Component Value Date   INR 1.00 06/04/2016   Lab Results  Component Value Date   NA 142 06/04/2016   K 3.8 06/04/2016   CL 105 06/04/2016   CO2 26 06/04/2016   BUN 20 06/04/2016   CREATININE 1.07 (H) 06/04/2016   GLUCOSE 94 06/04/2016    Discharge Medications:   Allergies as of 06/12/2016      Reactions   Codeine Nausea And Vomiting   Penicillins Other (See Comments)   Unknown childhood reaction      Medication List    TAKE these medications   ALEVE 220 MG Caps Generic drug:  Naproxen Sodium Take 440 mg by mouth daily as needed.   amLODipine 5 MG tablet Commonly known as:  NORVASC TAKE ONE TABLET BY MOUTH ONCE DAILY IN THE MORNING   aspirin  EC 325 MG tablet Take 1 tablet (325 mg total) by mouth 2 (two) times daily. What changed:  See the new instructions.   atorvastatin 10 MG tablet Commonly known as:  LIPITOR TAKE ONE TABLET BY MOUTH ONCE DAILY What changed:  See the new instructions.   fish oil-omega-3 fatty acids 1000 MG capsule Take 2 g by mouth daily.   HYDROmorphone 2 MG tablet Commonly known as:  DILAUDID Take 1 tablet (2 mg total) by mouth every 4 (four) hours as needed for severe pain.   irbesartan 300 MG tablet Commonly known as:  AVAPRO TAKE ONE TABLET BY MOUTH ONCE DAILY What changed:  See the new instructions.   IRON PO Take 1 tablet by mouth daily.   methocarbamol 750 MG tablet Commonly known as:  ROBAXIN Take 1 tablet (750 mg total) by mouth 2 (two)  times daily as needed for muscle spasms.   metoprolol tartrate 25 MG tablet Commonly known as:  LOPRESSOR Take 0.5 tablets (12.5 mg total) by mouth 2 (two) times daily.   ondansetron 4 MG tablet Commonly known as:  ZOFRAN Take 1-2 tablets (4-8 mg total) by mouth every 8 (eight) hours as needed for nausea or vomiting.   promethazine 25 MG tablet Commonly known as:  PHENERGAN Take 1 tablet (25 mg total) by mouth every 6 (six) hours as needed for nausea.   QUEtiapine 50 MG tablet Commonly known as:  SEROQUEL TAKE ONE TABLET BY MOUTH AT BEDTIME AS NEEDED What changed:  See the new instructions.   senna-docusate 8.6-50 MG tablet Commonly known as:  SENOKOT S Take 1 tablet by mouth at bedtime as needed.   traMADol 50 MG tablet Commonly known as:  ULTRAM Take 1 tablet (50 mg total) by mouth every 6 (six) hours as needed for moderate pain. Post-operatively.   Vitamin D3 2000 units Tabs Take 2,000 Units by mouth daily.            Durable Medical Equipment        Start     Ordered   06/11/16 1603  DME Walker rolling  Once    Question:  Patient needs a walker to treat with the following condition  Answer:  Total knee replacement status   06/11/16 1602   06/11/16 1603  DME 3 n 1  Once     06/11/16 1602   06/11/16 1603  DME Bedside commode  Once    Question:  Patient needs a bedside commode to treat with the following condition  Answer:  Total knee replacement status   06/11/16 1602      Diagnostic Studies: Xr Knee 1-2 Views Right  Result Date: 05/18/2016 Localized medial compartment osteoarthritis  Xr Shoulder Left  Result Date: 05/18/2016 Advanced general joint disease of bilateral shoulders  Xr Shoulder Right  Result Date: 05/18/2016 Advanced degenerative joint disease bilateral shoulders   Disposition: 01-Home or Self Care  Discharge Instructions    Call MD / Call 911    Complete by:  As directed    If you experience chest pain or shortness of breath, CALL  911 and be transported to the hospital emergency room.  If you develope a fever above 101.5 F, pus (white drainage) or increased drainage or redness at the wound, or calf pain, call your surgeon's office.   Constipation Prevention    Complete by:  As directed    Drink plenty of fluids.  Prune juice may be helpful.  You may use a stool softener, such  as Colace (over the counter) 100 mg twice a day.  Use MiraLax (over the counter) for constipation as needed.   Driving restrictions    Complete by:  As directed    No driving while taking narcotic pain meds.   Increase activity slowly as tolerated    Complete by:  As directed       Follow-up Information    Eduard Roux, MD In 2 weeks.   Specialty:  Orthopedic Surgery Why:  For suture removal, For wound re-check Contact information: [place] [phone]            Signed: Eduard Roux 06/12/2016, 8:36 AM

## 2016-06-12 NOTE — Evaluation (Signed)
Occupational Therapy Evaluation Patient Details Name: Rhonda Norman MRN: [id number] DOB: May 24, 1932 Today's Date: 06/12/2016    History of Present Illness Pt is an 83 yo female admitted to [facility] on 06/11/16 for right unicompatamental knee arthroplasty. PMH significant for anxiety, OA, DJD, bladder CA, CAD, depression, HLD, HTN.    Clinical Impression   Pt is at min A level with LB ADLs and sup with ADL mobility using RW. Pt ha all necessary DME and A/E at home. Pt will have assist form her family at home. All education completed and no further acute OT indicated at this time    Follow Up Recommendations  No OT follow up;Supervision - Intermittent    Equipment Recommendations  None recommended by OT    Recommendations for Other Services       Precautions / Restrictions Precautions Precautions: Knee Precaution Booklet Issued: Yes (comment) Precaution Comments: handout given and reviewed no pillow under knee Restrictions Weight Bearing Restrictions: Yes RLE Weight Bearing: Weight bearing as tolerated      Mobility Bed Mobility               General bed mobility comments: pt up in recliner  Transfers Overall transfer level: Needs assistance Equipment used: Rolling walker (2 wheeled) Transfers: Sit to/from Stand Sit to Stand: Supervision         General transfer comment: Supervision from safety from EOB and BSC    Balance Overall balance assessment: No apparent balance deficits (not formally assessed)                                          ADL                                         General ADL Comments: min A with LB bathing and dressing     Vision Vision Assessment?: No apparent visual deficits              Pertinent Vitals/Pain Pain Assessment: 0-10 Pain Score: 3  Pain Location: R knee Pain Descriptors / Indicators: Aching;Grimacing;Guarding Pain Intervention(s): Monitored during session;Premedicated  before session;Repositioned     Hand Dominance Right   Extremity/Trunk Assessment Upper Extremity Assessment Upper Extremity Assessment: Overall WFL for tasks assessed   Lower Extremity Assessment Lower Extremity Assessment: Defer to PT evaluation RLE Deficits / Details: Pt with normal post op pain and weakness. At least 3/5 ankle and 2/5 knee and hip per gross functional assessment   Cervical / Trunk Assessment Cervical / Trunk Assessment: Normal   Communication Communication Communication: No difficulties   Cognition Arousal/Alertness: Awake/alert Behavior During Therapy: WFL for tasks assessed/performed Overall Cognitive Status: Within Functional Limits for tasks assessed                     General Comments   pt very pleasant and cooperative, family very supportive                Home Living Family/patient expects to be discharged to:: Private residence Living Arrangements: Alone Available Help at Discharge: Family;Available 24 hours/day   Home Access: Stairs to enter [employer] of Steps: 2 Entrance Stairs-Rails: None Home Layout: One level     Bathroom Shower/Tub: Teacher, early years/pre: Handicapped height Bathroom Accessibility: Yes  Home Equipment: None;Shower seat          Prior Functioning/Environment Level of Independence: Independent        Comments: was doing all adls and iadls and driving        OT Problem List: Pain;Decreased knowledge of use of DME or AE;Decreased activity tolerance   OT Treatment/Interventions:      OT Goals(Current goals can be found in the care plan section) Acute Rehab OT Goals Patient Stated Goal: to get home today OT Goal Formulation: With patient/family Time For Goal Achievement: 06/19/16  OT Frequency:     Barriers to D/C:   No barriers                       End of Session Equipment Utilized During Treatment: Rolling walker  Activity Tolerance: Patient  tolerated treatment well Patient left: in chair   Time: 1025-1049 OT Time Calculation (min): 24 min Charges:  OT General Charges $OT Visit: 1 Procedure OT Evaluation $OT Eval Low Complexity: 1 Procedure OT Treatments $Therapeutic Activity: 8-22 mins G-Codes: OT G-codes **NOT FOR INPATIENT CLASS** Functional Assessment Tool Used: clinical judgement Functional Limitation: Self care Self Care Current Status [id number]): At least 1 percent but less than 20 percent impaired, limited or restricted Self Care Goal Status [id number]): At least 1 percent but less than 20 percent impaired, limited or restricted Self Care Discharge Status [phone]): At least 1 percent but less than 20 percent impaired, limited or restricted  Britt Bottom 06/12/2016, 1:13 PM

## 2016-06-12 NOTE — Progress Notes (Signed)
Patient and patient's daughter verbalized understanding of discharge instructions and were provided with prescriptions. Thigh high TED stockings applied to bilateral lower extremities. patient verbalized understanding of wearing stockings during the day and that she can take them off at night. Patient taken out by wheelchair by volunteer services.

## 2016-06-12 NOTE — Progress Notes (Signed)
   Subjective:  Patient reports pain as moderate.   Objective:   VITALS:   Vitals:   06/11/16 1607 06/11/16 2117 06/11/16 2134 06/12/16 0500  BP: (!) 145/58 (!) 149/65 (!) 142/60 (!) 144/72  Pulse: 78 79 74 65  Resp: 16   17  Temp: 97.5 F (36.4 C)  98.1 F (36.7 C) 98.4 F (36.9 C)  TempSrc: Oral  Oral Oral  SpO2: 98%  98% 98%  Weight:        Neurologically intact Neurovascular intact Sensation intact distally Intact pulses distally Dorsiflexion/Plantar flexion intact Incision: dressing C/D/I and no drainage No cellulitis present Compartment soft   Lab Results  Component Value Date   WBC 6.9 06/12/2016   HGB 11.4 (L) 06/12/2016   HCT 34.2 (L) 06/12/2016   MCV 90.2 06/12/2016   PLT 159 06/12/2016     Assessment/Plan:  1 Day Post-Op   - Expected postop acute blood loss anemia - will monitor for symptoms - Up with PT/OT - DVT ppx - SCDs, ambulation, aspirin - WBAT operative extremity - Pain controlled - Discharge planning - dc home with HHPT after PT session today  Eduard Roux 06/12/2016, 8:35 AM [phone]

## 2016-06-18 ENCOUNTER — Telehealth (INDEPENDENT_AMBULATORY_CARE_PROVIDER_SITE_OTHER): Payer: Self-pay | Admitting: Orthopaedic Surgery

## 2016-06-18 MED ORDER — HYDROMORPHONE HCL 2 MG PO TABS: ORAL_TABLET | ORAL | 0 refills | Status: DC

## 2016-06-18 NOTE — Telephone Encounter (Signed)
Yes dilaudid 2 mg.  1 tab po tid prn pain. #30

## 2016-06-18 NOTE — Telephone Encounter (Signed)
Patient called needing Rx refilled  ( Hydromorthone) The number to contact her is [phone]

## 2016-06-18 NOTE — Telephone Encounter (Signed)
Please advise 

## 2016-06-18 NOTE — Addendum Note (Signed)
Addended by: Precious Bard on: 06/18/2016 03:19 PM   Modules accepted: Orders

## 2016-06-18 NOTE — Telephone Encounter (Signed)
Pending Signature. Rx on Dr Phoebe Sharps desk.

## 2016-06-18 NOTE — Addendum Note (Signed)
Addended by: Precious Bard on: 06/18/2016 03:08 PM   Modules accepted: Orders

## 2016-06-19 ENCOUNTER — Telehealth (INDEPENDENT_AMBULATORY_CARE_PROVIDER_SITE_OTHER): Payer: Self-pay | Admitting: Orthopaedic Surgery

## 2016-06-19 NOTE — Telephone Encounter (Signed)
I called spoke with patients daughter Lynelle Smoke to advise rx is at front desk for pick up.

## 2016-06-19 NOTE — Telephone Encounter (Signed)
Please call pt daughter for pt RX refill, said they need today  [phone]

## 2016-06-22 NOTE — Anesthesia Postprocedure Evaluation (Signed)
Anesthesia Post Note  Patient: Rhonda Norman  Procedure(s) Performed: Procedure(s) (LRB): RIGHT UNICOMPARTMENTAL KNEE ARTHROPLASTY (Right)  Patient location during evaluation: PACU Anesthesia Type: General Level of consciousness: awake Pain management: pain level controlled Respiratory status: spontaneous breathing Anesthetic complications: no       Last Vitals:  Vitals:   06/11/16 2134 06/12/16 0500  BP: (!) 142/60 (!) 144/72  Pulse: 74 65  Resp:  17  Temp: 36.7 C 36.9 C    Last Pain:  Vitals:   06/12/16 0534  TempSrc:   PainSc: 0-No pain                 [employer]

## 2016-06-25 ENCOUNTER — Inpatient Hospital Stay (INDEPENDENT_AMBULATORY_CARE_PROVIDER_SITE_OTHER): Payer: Medicare Other | Admitting: Orthopaedic Surgery

## 2016-06-29 ENCOUNTER — Encounter (INDEPENDENT_AMBULATORY_CARE_PROVIDER_SITE_OTHER): Payer: Self-pay | Admitting: Orthopaedic Surgery

## 2016-06-29 ENCOUNTER — Ambulatory Visit (INDEPENDENT_AMBULATORY_CARE_PROVIDER_SITE_OTHER): Payer: Medicare Other | Admitting: Orthopaedic Surgery

## 2016-06-29 DIAGNOSIS — M1711 Unilateral primary osteoarthritis, right knee: Secondary | ICD-10-CM

## 2016-06-29 MED ORDER — TRAMADOL HCL 50 MG PO TABS: 50.0000 mg | ORAL_TABLET | Freq: Four times a day (QID) | ORAL | 0 refills | Status: DC | PRN

## 2016-06-29 MED ORDER — CELECOXIB 200 MG PO CAPS: 200.0000 mg | ORAL_CAPSULE | Freq: Two times a day (BID) | ORAL | 3 refills | Status: DC

## 2016-06-29 NOTE — Progress Notes (Signed)
Patient is 2 weeks status post right partial knee replacement. She is doing well. She is doing home health physical therapy and progressing appropriately. She is taking aspirin. He is requesting NSAIDs for her shoulder arthritis. Exam of her knee shows excellent range of motion. Minimal swelling. No signs of infection. Calf is nontender. Celebrex and tramadol refilled. Continue with home health physical therapy. I'll see her back in 4 weeks with 2 view x-rays of the right knee

## 2016-07-31 ENCOUNTER — Ambulatory Visit (INDEPENDENT_AMBULATORY_CARE_PROVIDER_SITE_OTHER): Payer: Medicare Other | Admitting: Internal Medicine

## 2016-07-31 ENCOUNTER — Encounter: Payer: Self-pay | Admitting: Internal Medicine

## 2016-07-31 VITALS — BP 132/86 | HR 68 | Temp 98.4°F | Ht 66.0 in | Wt 168.0 lb

## 2016-07-31 DIAGNOSIS — Z0000 Encounter for general adult medical examination without abnormal findings: Secondary | ICD-10-CM | POA: Diagnosis not present

## 2016-07-31 MED ORDER — IRBESARTAN 300 MG PO TABS: 300.0000 mg | ORAL_TABLET | Freq: Every day | ORAL | 3 refills | Status: DC

## 2016-07-31 MED ORDER — METOPROLOL TARTRATE 25 MG PO TABS: 12.5000 mg | ORAL_TABLET | Freq: Two times a day (BID) | ORAL | 3 refills | Status: DC

## 2016-07-31 MED ORDER — QUETIAPINE FUMARATE 50 MG PO TABS: 50.0000 mg | ORAL_TABLET | Freq: Every evening | ORAL | 1 refills | Status: DC | PRN

## 2016-07-31 MED ORDER — AMLODIPINE BESYLATE 5 MG PO TABS: ORAL_TABLET | ORAL | 3 refills | Status: DC

## 2016-07-31 MED ORDER — ATORVASTATIN CALCIUM 10 MG PO TABS: 10.0000 mg | ORAL_TABLET | Freq: Every day | ORAL | 3 refills | Status: DC

## 2016-07-31 NOTE — Progress Notes (Signed)
Subjective:    Patient ID: Rhonda Norman, female    DOB: 30-Jun-1932, 83 y.o.   MRN: [id number]  HPI   Here for wellness and f/u;  Overall doing ok;  Pt denies Chest pain, worsening SOB, DOE, wheezing, orthopnea, PND, worsening LE edema, palpitations, dizziness or syncope.  Pt denies neurological change such as new headache, facial or extremity weakness.  Pt denies polydipsia, polyuria, or low sugar symptoms. Pt states overall good compliance with treatment and medications, good tolerability, and has been trying to follow appropriate diet.  Pt denies worsening depressive symptoms, suicidal ideation or panic. No fever, night sweats, wt loss, loss of appetite, or other constitutional symptoms.  Pt states good ability with ADL's, has low fall risk, home safety reviewed and adequate, no other significant changes in hearing or vision, and only occasionally active with exercise.  s/p bladder surgury and s/p right TKA.   Declines tetanus for now and dxa for now.   Declines labs today Past Medical History:  Diagnosis Date  . Anxiety   . Arthritis    DJD-right knee  . Atrial tachycardia, paroxysmal ([place])   . Bladder cancer [facility]) urologist- dr Tammi Klippel   now recurrent  w/ hx high grade superficial carcinoma in 2015 s/p TURBT and chemo instillation  . CAD (coronary artery disease)    non obstructive, [name], april 2010  . Depression   . Full dentures   . GERD (gastroesophageal reflux disease)   . History of iron deficiency anemia   . Hyperlipidemia   . Hypertension   . Pre-diabetes    March 2017  A1C was 6.3.  She takes no meds   Past Surgical History:  Procedure Laterality Date  . ABDOMINAL HYSTERECTOMY  1970's   w/ bilateral salpingoophorectomy  . CARDIAC CATHETERIZATION  08-22-2008  dr Cathie Olden   mild to moderate coronary artery irregularies/  dRCA 30%/  ef 60%/  possible coronary spasm  . CARDIOVASCULAR STRESS TEST  08-15-2008  dr Cathie Olden   abnormal stress dual isotope/  BP drop  following exercise is concern/ no evidence ischenmia/ LV function normal/ ef 74%  . CATARACT EXTRACTION W/ INTRAOCULAR LENS  IMPLANT, BILATERAL  2013  . CYSTOSCOPY W/ RETROGRADES Bilateral 02/21/2014   Procedure: CYSTOSCOPY WITH RETROGRADE PYELOGRAM, Bilateral ;  Surgeon: [name], MD;  Location: [facility];  Service: Urology;  Laterality: Bilateral;  . CYSTOSCOPY W/ RETROGRADES Bilateral 01/01/2016   Procedure: CYSTOSCOPY WITH RETROGRADE PYELOGRAM;  Surgeon: [name], MD;  Location: [facility];  Service: Urology;  Laterality: Bilateral;  . CYSTOSCOPY W/ URETERAL STENT PLACEMENT Bilateral 01/04/2014   Procedure: CYSTOSCOPY WITH bilateral RETROGRADE PYELOGRAM;  Surgeon: [name], MD;  Location: [facility];  Service: Urology;  Laterality: Bilateral;  . PARTIAL KNEE ARTHROPLASTY Right 06/11/2016   Procedure: RIGHT UNICOMPARTMENTAL KNEE ARTHROPLASTY;  Surgeon: Leandrew Koyanagi, MD;  Location: [facility];  Service: Orthopedics;  Laterality: Right;  . TRANSURETHRAL RESECTION OF BLADDER TUMOR N/A 01/01/2016   Procedure: TRANSURETHRAL RESECTION OF BLADDER TUMOR (TURBT) WITH MITOMYCIN;  Surgeon: [name], MD;  Location: [facility];  Service: Urology;  Laterality: N/A;  . TRANSURETHRAL RESECTION OF BLADDER TUMOR WITH GYRUS (TURBT-GYRUS) N/A 01/04/2014   Procedure: TRANSURETHRAL RESECTION OF BLADDER TUMOR WITH GYRUS (TURBT-GYRUS);  Surgeon: [name], MD;  Location: [facility];  Service: Urology;  Laterality: N/A;  . TRANSURETHRAL RESECTION OF BLADDER TUMOR WITH GYRUS (TURBT-GYRUS) N/A 02/21/2014   Procedure: TRANSURETHRAL RESECTION OF BLADDER  TUMOR WITH GYRUS (TURBT-GYRUS);  Surgeon: [name], MD;  Location: [facility];  Service: Urology;  Laterality: N/A;    reports that she has never smoked. She has never used smokeless tobacco. She reports that she drinks alcohol. She reports that she does not  use drugs. family history includes Cancer in her mother; Hypertension in her father. Allergies  Allergen Reactions  . Codeine Nausea And Vomiting  . Penicillins Other (See Comments)    Unknown childhood reaction   Current Outpatient Prescriptions on File Prior to Visit  Medication Sig Dispense Refill  . aspirin EC 325 MG tablet Take 1 tablet (325 mg total) by mouth 2 (two) times daily. 84 tablet 0  . Cholecalciferol (VITAMIN D3) 2000 units TABS Take 2,000 Units by mouth daily.     . fish oil-omega-3 fatty acids 1000 MG capsule Take 2 g by mouth daily.    Marland Kitchen HYDROmorphone (DILAUDID) 2 MG tablet 1 tab po tid prn pain 30 tablet 0  . IRON PO Take 1 tablet by mouth daily.    . methocarbamol (ROBAXIN) 750 MG tablet Take 1 tablet (750 mg total) by mouth 2 (two) times daily as needed for muscle spasms. 60 tablet 0  . Naproxen Sodium (ALEVE) 220 MG CAPS Take 440 mg by mouth daily as needed.     . senna-docusate (SENOKOT S) 8.6-50 MG tablet Take 1 tablet by mouth at bedtime as needed. 30 tablet 1  . traMADol (ULTRAM) 50 MG tablet Take 1 tablet (50 mg total) by mouth every 6 (six) hours as needed for moderate pain. Post-operatively. 60 tablet 0   No current facility-administered medications on file prior to visit.    Review of Systems Constitutional: Negative for other unusual diaphoresis, sweats, appetite or weight changes HENT: Negative for other worsening hearing loss, ear pain, facial swelling, mouth sores or neck stiffness.   Eyes: Negative for other worsening pain, redness or other visual disturbance.  Respiratory: Negative for other stridor or swelling Cardiovascular: Negative for other palpitations or other chest pain  Gastrointestinal: Negative for worsening diarrhea or loose stools, blood in stool, distention or other pain Genitourinary: Negative for hematuria, flank pain or other change in urine volume.  Musculoskeletal: Negative for myalgias or other joint swelling.  Skin: Negative for  other color change, or other wound or worsening drainage.  Neurological: Negative for other syncope or numbness. Hematological: Negative for other adenopathy or swelling Psychiatric/Behavioral: Negative for hallucinations, other worsening agitation, SI, self-injury, or new decreased concentration All other system neg per pt    Objective:   Physical Exam BP 132/86   Pulse 68   Temp 98.4 F (36.9 C) (Oral)   Ht 5\' 6"  (1.676 m)   Wt 168 lb (76.2 kg)   SpO2 98%   BMI 27.12 kg/m  VS noted,  Constitutional: Pt is oriented to person, place, and time. Appears well-developed and well-nourished, in no significant distress and comfortable Head: Normocephalic and atraumatic  Eyes: Conjunctivae and EOM are normal. Pupils are equal, round, and reactive to light Right Ear: External ear normal without discharge Left Ear: External ear normal without discharge Nose: Nose without discharge or deformity Mouth/Throat: Oropharynx is without other ulcerations and moist  Neck: Normal range of motion. Neck supple. No JVD present. No tracheal deviation present or significant neck LA or mass Cardiovascular: Normal rate, regular rhythm, normal heart sounds and intact distal pulses.   Pulmonary/Chest: WOB normal and breath sounds without rales or wheezing  Abdominal: Soft. Bowel  sounds are normal. NT. No HSM  Musculoskeletal: Normal range of motion. Exhibits no edema Lymphadenopathy: Has no other cervical adenopathy.  Neurological: Pt is alert and oriented to person, place, and time. Pt has normal reflexes. No cranial nerve deficit. Motor grossly intact, Gait intact Skin: Skin is warm and dry. No rash noted or new ulcerations Psychiatric:  Has normal mood and affect. Behavior is normal without agitation No other exam findings    Assessment & Plan:

## 2016-07-31 NOTE — Patient Instructions (Addendum)
Please remember to make your yearly eye doctor appt  Please continue all other medications as before, and refills have been done if requested.  Please have the pharmacy call with any other refills you may need.  Please continue your efforts at being more active, low cholesterol diet, and weight control.  You are otherwise up to date with prevention measures today.  Please keep your appointments with your specialists as you may have planned  We can hold on further labs today  Please call if you change your mind about the tetanus shot and bone density testing  Please remember to sign up for MyChart if you have not done so, as this will be important to you in the future with finding out test results, communicating by private email, and scheduling acute appointments online when needed.  If you have Medicare related insurance (such as traditional Medicare, [employer] or [employer], or similar), Please make an appointment at the [facility] with Sharee Pimple, the [employer], for your Wellness Visit in this office, which is a benefit with your insurance.  Please return in 1 year for your yearly visit, or sooner if needed, with Lab testing done 3-5 days before

## 2016-07-31 NOTE — Progress Notes (Signed)
Pre visit review using our clinic review tool, if applicable. No additional management support is needed unless otherwise documented below in the visit note. 

## 2016-08-02 NOTE — Assessment & Plan Note (Signed)

## 2016-08-03 ENCOUNTER — Ambulatory Visit (INDEPENDENT_AMBULATORY_CARE_PROVIDER_SITE_OTHER): Payer: Medicare Other

## 2016-08-03 ENCOUNTER — Ambulatory Visit (INDEPENDENT_AMBULATORY_CARE_PROVIDER_SITE_OTHER): Payer: Medicare Other | Admitting: Orthopaedic Surgery

## 2016-08-03 DIAGNOSIS — M1711 Unilateral primary osteoarthritis, right knee: Secondary | ICD-10-CM

## 2016-08-03 MED ORDER — TRAMADOL HCL 50 MG PO TABS: 50.0000 mg | ORAL_TABLET | Freq: Every day | ORAL | 0 refills | Status: DC | PRN

## 2016-08-03 NOTE — Progress Notes (Signed)
Patient is approximately 7 weeks status post right knee unicompartmental arthroplasty. She is doing well. Finish physical therapy she has resumed her previous activity level. She is not having any pain medicines. Range of motion is excellent. Ambulating without assist devices. X-ray show stable alignment no complications. Follow-up in 6 weeks for recheck. No x-rays needed. Reminded her of dental prophylaxis.

## 2016-09-03 ENCOUNTER — Encounter (INDEPENDENT_AMBULATORY_CARE_PROVIDER_SITE_OTHER): Payer: Self-pay | Admitting: Orthopaedic Surgery

## 2016-09-03 ENCOUNTER — Ambulatory Visit (INDEPENDENT_AMBULATORY_CARE_PROVIDER_SITE_OTHER): Payer: Medicare Other | Admitting: Orthopaedic Surgery

## 2016-09-03 DIAGNOSIS — M1711 Unilateral primary osteoarthritis, right knee: Secondary | ICD-10-CM

## 2016-09-03 MED ORDER — TRAMADOL HCL 50 MG PO TABS: 50.0000 mg | ORAL_TABLET | Freq: Every day | ORAL | 0 refills | Status: DC | PRN

## 2016-09-03 MED ORDER — TRAMADOL HCL 50 MG PO TABS: 50.0000 mg | ORAL_TABLET | Freq: Two times a day (BID) | ORAL | 3 refills | Status: DC | PRN

## 2016-09-03 NOTE — Progress Notes (Signed)
Patient is 3 months status post partial knee replacement. She is doing well. She does complain a little soreness and discomfort with increased activity. She did do a lot of walking recently. Overall she denies any constitutional symptoms and is satisfied with her progress. On exam she has no joint effusion. She has excellent range of motion. The scar is fully healed. No signs of infection. I did refill her tramadol. NSAIDs as needed. Ice as needed. Increase activity as tolerated. Follow-up in 3 months with 2 view x-rays of the right knee

## 2016-09-14 ENCOUNTER — Ambulatory Visit (INDEPENDENT_AMBULATORY_CARE_PROVIDER_SITE_OTHER): Payer: Medicare Other | Admitting: Orthopaedic Surgery

## 2016-11-23 ENCOUNTER — Other Ambulatory Visit: Payer: Self-pay | Admitting: Internal Medicine

## 2016-12-14 ENCOUNTER — Encounter (INDEPENDENT_AMBULATORY_CARE_PROVIDER_SITE_OTHER): Payer: Self-pay | Admitting: Orthopaedic Surgery

## 2016-12-14 ENCOUNTER — Ambulatory Visit (INDEPENDENT_AMBULATORY_CARE_PROVIDER_SITE_OTHER): Payer: Medicare Other | Admitting: Orthopaedic Surgery

## 2016-12-14 ENCOUNTER — Ambulatory Visit (INDEPENDENT_AMBULATORY_CARE_PROVIDER_SITE_OTHER): Payer: Medicare Other

## 2016-12-14 DIAGNOSIS — M1711 Unilateral primary osteoarthritis, right knee: Secondary | ICD-10-CM

## 2016-12-14 NOTE — Progress Notes (Signed)
Office Visit Note   Patient: Rhonda Norman           Date of Birth: [birth year]           MRN: [id number] Visit Date: 12/14/2016              Requested by: Biagio Borg, MD [address], [place] [id number] PCP: Biagio Borg, MD   Assessment & Plan: Visit Diagnoses:  1. Primary osteoarthritis of right knee     Plan: Patient is overall doing well. The incision is healed. The implant is stable. Recommend TED hose for pitting edema. Follow-up in 6 months with 2 view x-rays of the right knee.  Follow-Up Instructions: Return in about 6 months (around 06/16/2017).   Orders:  Orders Placed This Encounter  Procedures  . XR Knee 1-2 Views Right   No orders of the defined types were placed in this encounter.     Procedures: No procedures performed   Clinical Data: No additional findings.   Subjective: Chief Complaint  Patient presents with  . Right Knee - Pain, Follow-up    Patient is 6 months status post right partial knee replacement. She states she feels like she is slowly getting better. She still has some aspect of the knee. She endorses some bilateral lower extremity swelling. Denies any constitutional symptoms.    Review of Systems  Constitutional: Negative.   HENT: Negative.   Eyes: Negative.   Respiratory: Negative.   Cardiovascular: Negative.   Endocrine: Negative.   Musculoskeletal: Negative.   Neurological: Negative.   Hematological: Negative.   Psychiatric/Behavioral: Negative.   All other systems reviewed and are negative.    Objective: Vital Signs: There were no vitals taken for this visit.  Physical Exam  Constitutional: She is oriented to person, place, and time. She appears well-developed and well-nourished.  Pulmonary/Chest: Effort normal.  Neurological: She is alert and oriented to person, place, and time.  Skin: Skin is warm. Capillary refill takes less than 2 seconds.  Psychiatric: She has a normal mood and affect.  Her behavior is normal. Judgment and thought content normal.  Nursing note and vitals reviewed.   Ortho Exam Right knee exam shows a fully healed surgical scar. She is tender over the medial joint line. Collaterals and cruciates are stable. She has no lateral or anterior knee pain. Specialty Comments:  No specialty comments available.  Imaging: Xr Knee 1-2 Views Right  Result Date: 12/14/2016 Stable UKA    PMFS History: Patient Active Problem List   Diagnosis Date Noted  . S/P right unicompartmental knee replacement 06/11/2016  . Dysuria 09/01/2015  . Poison oak 09/01/2015  . PAT (paroxysmal atrial tachycardia) ([place]) 07/11/2015  . Bladder cancer ([place]) 02/12/2015  . Back pain 02/12/2015  . Hematuria 06/29/2013  . Diabetes ([place]) 06/02/2012  . Primary osteoarthritis of right knee 06/01/2012  . NSAID long-term use 06/01/2012  . CAD (coronary artery disease)   . Preventative health care 06/12/2011  . GERD 06/30/2010  . ANEMIA-IRON DEFICIENCY 06/19/2008  . Hyperlipidemia 05/27/2007  . ANXIETY 05/27/2007  . DEPRESSION 05/27/2007  . Essential hypertension 05/27/2007   Past Medical History:  Diagnosis Date  . Anxiety   . Arthritis    DJD-right knee  . Atrial tachycardia, paroxysmal ([place])   . Bladder cancer [facility]) urologist- dr Tammi Klippel   now recurrent  w/ hx high grade superficial carcinoma in 2015 s/p TURBT and chemo instillation  . CAD (coronary artery disease)  non obstructive, [name], april 2010  . Depression   . Full dentures   . GERD (gastroesophageal reflux disease)   . History of iron deficiency anemia   . Hyperlipidemia   . Hypertension   . Pre-diabetes    March 2017  A1C was 6.3.  She takes no meds    Family History  Problem Relation Age of Onset  . Cancer Mother   . Hypertension Father     Past Surgical History:  Procedure Laterality Date  . ABDOMINAL HYSTERECTOMY  1970's   w/ bilateral salpingoophorectomy  . CARDIAC CATHETERIZATION  08-22-2008   dr Cathie Olden   mild to moderate coronary artery irregularies/  dRCA 30%/  ef 60%/  possible coronary spasm  . CARDIOVASCULAR STRESS TEST  08-15-2008  dr Cathie Olden   abnormal stress dual isotope/  BP drop following exercise is concern/ no evidence ischenmia/ LV function normal/ ef 74%  . CATARACT EXTRACTION W/ INTRAOCULAR LENS  IMPLANT, BILATERAL  2013  . CYSTOSCOPY W/ RETROGRADES Bilateral 02/21/2014   Procedure: CYSTOSCOPY WITH RETROGRADE PYELOGRAM, Bilateral ;  Surgeon: [name], MD;  Location: [facility];  Service: Urology;  Laterality: Bilateral;  . CYSTOSCOPY W/ RETROGRADES Bilateral 01/01/2016   Procedure: CYSTOSCOPY WITH RETROGRADE PYELOGRAM;  Surgeon: [name], MD;  Location: [facility];  Service: Urology;  Laterality: Bilateral;  . CYSTOSCOPY W/ URETERAL STENT PLACEMENT Bilateral 01/04/2014   Procedure: CYSTOSCOPY WITH bilateral RETROGRADE PYELOGRAM;  Surgeon: [name], MD;  Location[facility];  Service: Urology;  Laterality: Bilateral;  . PARTIAL KNEE ARTHROPLASTY Right 06/11/2016   Procedure: RIGHT UNICOMPARTMENTAL KNEE ARTHROPLASTY;  Surgeon: Leandrew Koyanagi, MD;  Location: [place];  Service: Orthopedics;  Laterality: Right;  . TRANSURETHRAL RESECTION OF BLADDER TUMOR N/A 01/01/2016   Procedure: TRANSURETHRAL RESECTION OF BLADDER TUMOR (TURBT) WITH MITOMYCIN;  Surgeon: [name], MD;  Location: [facility];  Service: Urology;  Laterality: N/A;  . TRANSURETHRAL RESECTION OF BLADDER TUMOR WITH GYRUS (TURBT-GYRUS) N/A 01/04/2014   Procedure: TRANSURETHRAL RESECTION OF BLADDER TUMOR WITH GYRUS (TURBT-GYRUS);  Surgeon: [name], MD;  Location: [facility];  Service: Urology;  Laterality: N/A;  . TRANSURETHRAL RESECTION OF BLADDER TUMOR WITH GYRUS (TURBT-GYRUS) N/A 02/21/2014   Procedure: TRANSURETHRAL RESECTION OF BLADDER TUMOR WITH GYRUS (TURBT-GYRUS);  Surgeon: [name], MD;  [facility];  Service: Urology;  Laterality: N/A;   Social History   Occupational History  . Not on file.   Social History Main Topics  . Smoking status: Never Smoker  . Smokeless tobacco: Never Used  . Alcohol use Yes     Comment: social  . Drug use: No  . Sexual activity: Not on file

## 2016-12-16 ENCOUNTER — Other Ambulatory Visit: Payer: Self-pay | Admitting: Internal Medicine

## 2016-12-16 ENCOUNTER — Ambulatory Visit (INDEPENDENT_AMBULATORY_CARE_PROVIDER_SITE_OTHER): Payer: Medicare Other | Admitting: Internal Medicine

## 2016-12-16 ENCOUNTER — Encounter: Payer: Self-pay | Admitting: Internal Medicine

## 2016-12-16 ENCOUNTER — Other Ambulatory Visit (INDEPENDENT_AMBULATORY_CARE_PROVIDER_SITE_OTHER): Payer: Medicare Other

## 2016-12-16 ENCOUNTER — Telehealth: Payer: Self-pay

## 2016-12-16 VITALS — BP 128/82 | HR 56 | Temp 98.1°F | Ht 66.0 in | Wt 168.0 lb

## 2016-12-16 DIAGNOSIS — R42 Dizziness and giddiness: Secondary | ICD-10-CM | POA: Insufficient documentation

## 2016-12-16 DIAGNOSIS — F329 Major depressive disorder, single episode, unspecified: Secondary | ICD-10-CM

## 2016-12-16 DIAGNOSIS — J309 Allergic rhinitis, unspecified: Secondary | ICD-10-CM | POA: Diagnosis not present

## 2016-12-16 DIAGNOSIS — F32A Depression, unspecified: Secondary | ICD-10-CM

## 2016-12-16 DIAGNOSIS — R350 Frequency of micturition: Secondary | ICD-10-CM | POA: Diagnosis not present

## 2016-12-16 DIAGNOSIS — G8929 Other chronic pain: Secondary | ICD-10-CM

## 2016-12-16 HISTORY — DX: Allergic rhinitis, unspecified: J30.9

## 2016-12-16 LAB — URINALYSIS, ROUTINE W REFLEX MICROSCOPIC
Bilirubin Urine: NEGATIVE
HGB URINE DIPSTICK: NEGATIVE
Ketones, ur: NEGATIVE
Nitrite: NEGATIVE
SPECIFIC GRAVITY, URINE: 1.01 (ref 1.000–1.030)
Total Protein, Urine: NEGATIVE
Urine Glucose: NEGATIVE
Urobilinogen, UA: 0.2 (ref 0.0–1.0)
pH: 6 (ref 5.0–8.0)

## 2016-12-16 MED ORDER — NITROFURANTOIN MACROCRYSTAL 50 MG PO CAPS: 50.0000 mg | ORAL_CAPSULE | Freq: Two times a day (BID) | ORAL | 0 refills | Status: DC

## 2016-12-16 MED ORDER — DULOXETINE HCL 30 MG PO CPEP: 30.0000 mg | ORAL_CAPSULE | Freq: Every day | ORAL | 3 refills | Status: DC

## 2016-12-16 MED ORDER — MECLIZINE HCL 12.5 MG PO TABS: 12.5000 mg | ORAL_TABLET | Freq: Three times a day (TID) | ORAL | 1 refills | Status: DC | PRN

## 2016-12-16 NOTE — Assessment & Plan Note (Signed)
Mild to mod, for cymbalta 30 qd,  to f/u any worsening symptoms or concerns 

## 2016-12-16 NOTE — Telephone Encounter (Signed)
-----   Message from Biagio Borg, MD sent at 12/16/2016  1:15 PM EDT ----- Letter sent, cont same tx except  The test results show that your current treatment is OK, except the urine test is consistent with possible infection.  We will send an antibiotic to the pharmacy, and you should be called from the office as well.    Antrone [place] to please inform pt, I will do rx

## 2016-12-16 NOTE — Assessment & Plan Note (Addendum)
Right knee and bilat shoulders, as well as more recent nightly neuritic distal LE pain, for cymbalta 30 qd,  to f/u any worsening symptoms or concerns

## 2016-12-16 NOTE — Progress Notes (Addendum)
Subjective:    Patient ID: Rhonda Norman, female    DOB: 16-Feb-1933, 83 y.o.   MRN: [id number]  HPI  Here to f/u with several concerns; c/o urinary freq x 3 days but Denies urinary symptoms such as dysuria, urgency, flank pain, hematuria or n/v, fever, chills, just does not feel good.  Does have several wks ongoing nasal allergy symptoms with clearish congestion, itch and sneezing, without fever, pain, ST, cough, swelling or wheezing.  Pt denies new neurological symptoms such as new headache, or facial or extremity weakness or numbness, except has 2 days onset positional vertigo.  Also has mild worsening depressive symptoms, but no suicidal ideation, or panic; thinks symptoms worse due to recent persistent pain to right knee that is still painful post surgury with scar tissue per pt per ortho, and bilat shoulder pain.  Also c/o persistent burning neuritic type pain to both legs for several months only worse with going to bed.   Past Medical History:  Diagnosis Date  . Anxiety   . Arthritis    DJD-right knee  . Atrial tachycardia, paroxysmal ([place])   . Bladder cancer [facility]) urologist- dr Tammi Klippel   now recurrent  w/ hx high grade superficial carcinoma in 2015 s/p TURBT and chemo instillation  . CAD (coronary artery disease)    non obstructive, [name], april 2010  . Depression   . Full dentures   . GERD (gastroesophageal reflux disease)   . History of iron deficiency anemia   . Hyperlipidemia   . Hypertension   . Pre-diabetes    March 2017  A1C was 6.3.  She takes no meds   Past Surgical History:  Procedure Laterality Date  . ABDOMINAL HYSTERECTOMY  1970's   w/ bilateral salpingoophorectomy  . CARDIAC CATHETERIZATION  08-22-2008  dr Cathie Olden   mild to moderate coronary artery irregularies/  dRCA 30%/  ef 60%/  possible coronary spasm  . CARDIOVASCULAR STRESS TEST  08-15-2008  dr Cathie Olden   abnormal stress dual isotope/  BP drop following exercise is concern/ no evidence ischenmia/ LV  function normal/ ef 74%  . CATARACT EXTRACTION W/ INTRAOCULAR LENS  IMPLANT, BILATERAL  2013  . CYSTOSCOPY W/ RETROGRADES Bilateral 02/21/2014   Procedure: CYSTOSCOPY WITH RETROGRADE PYELOGRAM, Bilateral ;  Surgeon: [name], MD;  Location: [facility];  Service: Urology;  Laterality: Bilateral;  . CYSTOSCOPY W/ RETROGRADES Bilateral 01/01/2016   Procedure: CYSTOSCOPY WITH RETROGRADE PYELOGRAM;  Surgeon: [name], MD;  Location[facility];  Service: Urology;  Laterality: Bilateral;  . CYSTOSCOPY W/ URETERAL STENT PLACEMENT Bilateral 01/04/2014   Procedure: CYSTOSCOPY WITH bilateral RETROGRADE PYELOGRAM;  Surgeon: [name], MD;  Location: [facility];  Service: Urology;  Laterality: Bilateral;  . PARTIAL KNEE ARTHROPLASTY Right 06/11/2016   Procedure: RIGHT UNICOMPARTMENTAL KNEE ARTHROPLASTY;  Surgeon: Leandrew Koyanagi, MD;  Location: [place];  Service: Orthopedics;  Laterality: Right;  . TRANSURETHRAL RESECTION OF BLADDER TUMOR N/A 01/01/2016   Procedure: TRANSURETHRAL RESECTION OF BLADDER TUMOR (TURBT) WITH MITOMYCIN;  Surgeon: [name], MD;  Location: [facility];  Service: Urology;  Laterality: N/A;  . TRANSURETHRAL RESECTION OF BLADDER TUMOR WITH GYRUS (TURBT-GYRUS) N/A 01/04/2014   Procedure: TRANSURETHRAL RESECTION OF BLADDER TUMOR WITH GYRUS (TURBT-GYRUS);  Surgeon: [name], MD;  Location: [facility];  Service: Urology;  Laterality: N/A;  . TRANSURETHRAL RESECTION OF BLADDER TUMOR WITH GYRUS (TURBT-GYRUS) N/A 02/21/2014   Procedure: TRANSURETHRAL RESECTION OF BLADDER TUMOR WITH GYRUS (  TURBT-GYRUS);  Surgeon: [name], MD;  Location: [facility];  Service: Urology;  Laterality: N/A;    reports that she has never smoked. She has never used smokeless tobacco. She reports that she drinks alcohol. She reports that she does not use drugs. family history includes Cancer in her  mother; Hypertension in her father. Allergies  Allergen Reactions  . Codeine Nausea And Vomiting  . Penicillins Other (See Comments)    Unknown childhood reaction   Current Outpatient Prescriptions on File Prior to Visit  Medication Sig Dispense Refill  . amLODipine (NORVASC) 5 MG tablet TAKE ONE TABLET BY MOUTH ONCE DAILY IN THE MORNING 90 tablet 3  . aspirin EC 325 MG tablet Take 1 tablet (325 mg total) by mouth 2 (two) times daily. 84 tablet 0  . atorvastatin (LIPITOR) 10 MG tablet Take 1 tablet (10 mg total) by mouth daily. 90 tablet 3  . Cholecalciferol (VITAMIN D3) 2000 units TABS Take 2,000 Units by mouth daily.     . fish oil-omega-3 fatty acids 1000 MG capsule Take 2 g by mouth daily.    Marland Kitchen HYDROmorphone (DILAUDID) 2 MG tablet 1 tab po tid prn pain 30 tablet 0  . irbesartan (AVAPRO) 300 MG tablet Take 1 tablet (300 mg total) by mouth daily. 90 tablet 3  . IRON PO Take 1 tablet by mouth daily.    . methocarbamol (ROBAXIN) 750 MG tablet Take 1 tablet (750 mg total) by mouth 2 (two) times daily as needed for muscle spasms. 60 tablet 0  . metoprolol tartrate (LOPRESSOR) 25 MG tablet Take 0.5 tablets (12.5 mg total) by mouth 2 (two) times daily. 90 tablet 3  . Naproxen Sodium (ALEVE) 220 MG CAPS Take 440 mg by mouth daily as needed.     Marland Kitchen QUEtiapine (SEROQUEL) 50 MG tablet Take 1 tablet (50 mg total) by mouth at bedtime as needed. 90 tablet 1  . senna-docusate (SENOKOT S) 8.6-50 MG tablet Take 1 tablet by mouth at bedtime as needed. 30 tablet 1  . traMADol (ULTRAM) 50 MG tablet Take 1 tablet (50 mg total) by mouth daily as needed for moderate pain. Post-operatively. 30 tablet 0   No current facility-administered medications on file prior to visit.    Review of Systems  Constitutional: Negative for other unusual diaphoresis or sweats HENT: Negative for ear discharge or swelling Eyes: Negative for other worsening visual disturbances Respiratory: Negative for stridor or other swelling    Gastrointestinal: Negative for worsening distension or other blood Genitourinary: Negative for retention or other urinary change Musculoskeletal: Negative for other MSK pain or swelling Skin: Negative for color change or other new lesions Neurological: Negative for worsening tremors and other numbness  Psychiatric/Behavioral: Negative for worsening agitation or other fatigue All other system neg per pt    Objective:   Physical Exam BP 128/82   Pulse (!) 56   Temp 98.1 F (36.7 C)   Ht 5\' 6"  (1.676 m)   Wt 168 lb (76.2 kg)   SpO2 98%   BMI 27.12 kg/m  VS noted,  Constitutional: Pt appears in NAD HENT: Head: NCAT.  Right Ear: External ear normal.  Left Ear: External ear normal.  Eyes: . Pupils are equal, round, and reactive to light. Conjunctivae and EOM are normal Nose: without d/c or deformity Bilat tm's with mild erythema.  Max sinus areas non tender.  Pharynx with mild erythema, no exudate Neck: Neck supple. Gross normal ROM Cardiovascular: Normal rate and regular rhythm.  Pulmonary/Chest: Effort normal and breath sounds without rales or wheezing.  Abd: soft, NT, +BS Neurological: Pt is alert. At baseline orientation, motor grossly intact, has some decreased sensation to bilat LEs, no nystagmus Skin: Skin is warm. No rashes, other new lesions, no LE edema Psychiatric: Pt behavior is normal without agitation, + depressed affect and mood  No other exam findings    Assessment & Plan:

## 2016-12-16 NOTE — Telephone Encounter (Signed)
Pt has been informed and expressed understanding.  

## 2016-12-16 NOTE — Assessment & Plan Note (Signed)
Mild positional, for meclizine prn,  to f/u any worsening symptoms or concerns

## 2016-12-16 NOTE — Assessment & Plan Note (Signed)
For otc zyrtec, and nasacort asd,  to f/u any worsening symptoms or concerns

## 2016-12-16 NOTE — Assessment & Plan Note (Addendum)
?   UTI, for urine studies, consider antbx pending results  Note:  Total time for pt hx, exam, review of record with pt in the room, determination of diagnoses and plan for further eval and tx is > 40 min, with over 50% spent in coordination and counseling of patient, including the differential dx, tx, further evaluation and management of urinary frequency, chronic pain, depression, vertigo and allergic rhinitis

## 2016-12-16 NOTE — Patient Instructions (Addendum)
Please take all new medication as prescribed - the meclizine for dizziness, and cymbalta 30 mg per day for pain and low mood  Please call or let us know in 1 month if you think the cymbalta is helping, but you think you could try the higher dose (60 mg)  Please consider OTC zyrtec and/or nasacort for allergies  You can also take Mucinex (or it's generic off brand) for congestion, and tylenol as needed for pain.  Please continue all other medications as before, and refills have been done if requested.  Please have the pharmacy call with any other refills you may need.  Please keep your appointments with your specialists as you may have planned  Please go to the LAB in the Basement (turn left off the elevator) for the tests to be done today - just the urine test  You will be contacted by phone if any changes need to be made immediately.  Otherwise, you will receive a letter about your results with an explanation, but please check with MyChart first.  Please return in 6 months, or sooner if needed, with Lab testing done 3-5 days before

## 2016-12-17 LAB — URINE CULTURE: ORGANISM ID, BACTERIA: NO GROWTH

## 2017-01-15 ENCOUNTER — Telehealth: Payer: Self-pay | Admitting: Internal Medicine

## 2017-01-15 DIAGNOSIS — R300 Dysuria: Secondary | ICD-10-CM

## 2017-01-15 NOTE — Telephone Encounter (Signed)
Ok for urine testing to check this  - I have entered orders

## 2017-01-15 NOTE — Telephone Encounter (Signed)
Pt called in and said that she still has that uti for a month ago and would like to see if he would call in another med without her having to come in?    [facility] [address]

## 2017-01-15 NOTE — Telephone Encounter (Signed)
Called pt no answer LMOM w/MD response../lmb 

## 2017-01-18 ENCOUNTER — Other Ambulatory Visit (INDEPENDENT_AMBULATORY_CARE_PROVIDER_SITE_OTHER): Payer: Medicare Other

## 2017-01-18 DIAGNOSIS — R300 Dysuria: Secondary | ICD-10-CM

## 2017-01-18 LAB — URINALYSIS, ROUTINE W REFLEX MICROSCOPIC
Bilirubin Urine: NEGATIVE
Ketones, ur: NEGATIVE
Nitrite: NEGATIVE
PH: 6 (ref 5.0–8.0)
SPECIFIC GRAVITY, URINE: 1.02 (ref 1.000–1.030)
Total Protein, Urine: NEGATIVE
URINE GLUCOSE: NEGATIVE
Urobilinogen, UA: 0.2 (ref 0.0–1.0)

## 2017-01-19 ENCOUNTER — Encounter: Payer: Self-pay | Admitting: Internal Medicine

## 2017-01-19 LAB — URINE CULTURE
MICRO NUMBER:: 81053926
SPECIMEN QUALITY: ADEQUATE

## 2017-04-08 ENCOUNTER — Other Ambulatory Visit (INDEPENDENT_AMBULATORY_CARE_PROVIDER_SITE_OTHER): Payer: Self-pay | Admitting: Orthopaedic Surgery

## 2017-04-08 NOTE — Telephone Encounter (Signed)
yes

## 2017-04-09 ENCOUNTER — Telehealth (INDEPENDENT_AMBULATORY_CARE_PROVIDER_SITE_OTHER): Payer: Self-pay | Admitting: Orthopaedic Surgery

## 2017-04-09 NOTE — Telephone Encounter (Signed)
CALLED TO CLARIFY INSTRUCTIONS

## 2017-04-09 NOTE — Telephone Encounter (Signed)
[facility] called left voicemail message needing to verify directions on the Rx. The number to contact [facility] is [phone]

## 2017-07-28 ENCOUNTER — Ambulatory Visit: Payer: Medicare Other

## 2017-08-12 ENCOUNTER — Other Ambulatory Visit: Payer: Self-pay | Admitting: Urology

## 2017-08-17 NOTE — Patient Instructions (Addendum)
Rhonda Norman  08/17/2017   Your procedure is scheduled on: 08-27-17   Report to [facility]  Entrance Report to Admitting at 11:30 AM    Call this number if you have problems the morning of surgery [phone]   Remember: Do not eat food  :After Midnight.May have a Clear Liquids from Midnight until 7:30 day of surgery. After midnight, nothing until after surgery.      CLEAR LIQUID DIET   Foods Allowed                                                                     Foods Excluded  Coffee and tea, regular and decaf                             liquids that you cannot  Plain Jell-O in any flavor                                             see through such as: Fruit ices (not with fruit pulp)                                     milk, soups, orange juice  Iced Popsicles                                    All solid food Carbonated beverages, regular and diet                                    Cranberry, grape and apple juices Sports drinks like Gatorade Lightly seasoned clear broth or consume(fat free) Sugar, honey syrup  Sample Menu Breakfast                                Lunch                                     Supper Cranberry juice                    Beef broth                            Chicken broth Jell-O                                     Grape juice                           Apple juice Coffee or  tea                        Jell-O                                      Popsicle                                                Coffee or tea                        Coffee or tea  _____________________________________________________________________     Take these medicines the morning of surgery with A SIP OF WATER: Amlodipine (Norvasc), Metoprolol Tartrate (Lopressor)                                 You may not have any metal on your body including hair pins and              piercings  Do not wear jewelry, make-up, lotions, powders or  perfumes, deodorant             Do not wear nail polish.  Do not shave  48 hours prior to surgery.                Do not bring valuables to the hospital. [place].  Contacts, dentures or bridgework may not be worn into surgery.      Patients discharged the day of surgery will not be allowed to drive home.  Name and phone number of your driver: Rhonda Norman [phone]               Please read over the following fact sheets you were given: _____________________________________________________________________             [facility] - Preparing for Surgery Before surgery, you can play an important role.  Because skin is not sterile, your skin needs to be as free of germs as possible.  You can reduce the number of germs on your skin by washing with CHG (chlorahexidine gluconate) soap before surgery.  CHG is an antiseptic cleaner which kills germs and bonds with the skin to continue killing germs even after washing. Please DO NOT use if you have an allergy to CHG or antibacterial soaps.  If your skin becomes reddened/irritated stop using the CHG and inform your nurse when you arrive at Short Stay. Do not shave (including legs and underarms) for at least 48 hours prior to the first CHG shower.  You may shave your face/neck. Please follow these instructions carefully:  1.  Shower with CHG Soap the night before surgery and the  morning of Surgery.  2.  If you choose to wash your hair, wash your hair first as usual with your  normal  shampoo.  3.  After you shampoo, rinse your hair and body thoroughly to remove the  shampoo.                           4.  Use  CHG as you would any other liquid soap.  You can apply chg directly  to the skin and wash                       Gently with a scrungie or clean washcloth.  5.  Apply the CHG Soap to your body ONLY FROM THE NECK DOWN.   Do not use on face/ open                           Wound or open sores.  Avoid contact with eyes, ears mouth and genitals (private parts).                       Wash face,  Genitals (private parts) with your normal soap.             6.  Wash thoroughly, paying special attention to the area where your surgery  will be performed.  7.  Thoroughly rinse your body with warm water from the neck down.  8.  DO NOT shower/wash with your normal soap after using and rinsing off  the CHG Soap.                9.  Pat yourself dry with a clean towel.            10.  Wear clean pajamas.            11.  Place clean sheets on your bed the night of your first shower and do not  sleep with pets. Day of Surgery : Do not apply any lotions/deodorants the morning of surgery.  Please wear clean clothes to the hospital/surgery center.  FAILURE TO FOLLOW THESE INSTRUCTIONS MAY RESULT IN THE CANCELLATION OF YOUR SURGERY PATIENT SIGNATURE_________________________________  NURSE SIGNATURE__________________________________  ________________________________________________________________________

## 2017-08-18 ENCOUNTER — Ambulatory Visit (INDEPENDENT_AMBULATORY_CARE_PROVIDER_SITE_OTHER): Payer: Medicare Other | Admitting: Internal Medicine

## 2017-08-18 ENCOUNTER — Encounter: Payer: Self-pay | Admitting: Internal Medicine

## 2017-08-18 VITALS — BP 124/82 | HR 61 | Temp 97.9°F | Ht 66.0 in | Wt 171.0 lb

## 2017-08-18 DIAGNOSIS — E119 Type 2 diabetes mellitus without complications: Secondary | ICD-10-CM | POA: Diagnosis not present

## 2017-08-18 DIAGNOSIS — Z01818 Encounter for other preprocedural examination: Secondary | ICD-10-CM | POA: Insufficient documentation

## 2017-08-18 DIAGNOSIS — Z0000 Encounter for general adult medical examination without abnormal findings: Secondary | ICD-10-CM

## 2017-08-18 DIAGNOSIS — C439 Malignant melanoma of skin, unspecified: Secondary | ICD-10-CM | POA: Insufficient documentation

## 2017-08-18 MED ORDER — AMLODIPINE BESYLATE 5 MG PO TABS: ORAL_TABLET | ORAL | 3 refills | Status: DC

## 2017-08-18 MED ORDER — FERROUS SULFATE 325 (65 FE) MG PO TABS: 325.0000 mg | ORAL_TABLET | Freq: Every day | ORAL | 3 refills | Status: AC

## 2017-08-18 MED ORDER — TRAMADOL HCL 50 MG PO TABS: ORAL_TABLET | ORAL | 3 refills | Status: DC

## 2017-08-18 MED ORDER — ATORVASTATIN CALCIUM 10 MG PO TABS: 10.0000 mg | ORAL_TABLET | Freq: Every day | ORAL | 3 refills | Status: DC

## 2017-08-18 MED ORDER — METOPROLOL TARTRATE 25 MG PO TABS: 12.5000 mg | ORAL_TABLET | Freq: Two times a day (BID) | ORAL | 3 refills | Status: DC

## 2017-08-18 MED ORDER — IRBESARTAN 300 MG PO TABS: 300.0000 mg | ORAL_TABLET | Freq: Every day | ORAL | 3 refills | Status: DC

## 2017-08-18 MED ORDER — QUETIAPINE FUMARATE 50 MG PO TABS: 50.0000 mg | ORAL_TABLET | Freq: Every evening | ORAL | 1 refills | Status: DC | PRN

## 2017-08-18 NOTE — Assessment & Plan Note (Signed)

## 2017-08-18 NOTE — Patient Instructions (Addendum)
Please remember later to call for your yearly eye doctor appt  Your EKG was OK today  Please continue all other medications as before, and refills have been done if requested.  Please have the pharmacy call with any other refills you may need.  Please continue your efforts at being more active, low cholesterol diet, and weight control.  You are otherwise up to date with prevention measures today.  Please keep your appointments with your specialists as you may have planned  Please return in 1 year for your yearly visit, or sooner if needed, with Lab testing done 3-5 days before

## 2017-08-18 NOTE — Assessment & Plan Note (Signed)
Lab Results  Component Value Date   HGBA1C 5.9 (H) 06/04/2016  stable overall by history and exam, recent data reviewed with pt, and pt to continue medical treatment as before,  to f/u any worsening symptoms or concerns

## 2017-08-18 NOTE — Progress Notes (Signed)
Subjective:    Patient ID: Rhonda Norman, female    DOB: 26-Nov-1932, 84 y.o.   MRN: [id number]  HPI  Here for wellness and f/u;  Overall doing ok;  Pt denies Chest pain, worsening SOB, DOE, wheezing, orthopnea, PND, worsening LE edema, palpitations, dizziness or syncope.  Pt denies neurological change such as new headache, facial or extremity weakness.  Pt denies polydipsia, polyuria, or low sugar symptoms. Pt states overall good compliance with treatment and medications, good tolerability, and has been trying to follow appropriate diet.  Pt denies worsening depressive symptoms, suicidal ideation or panic. No fever, night sweats, wt loss, loss of appetite, or other constitutional symptoms.  Pt states good ability with ADL's, has low fall risk, home safety reviewed and adequate, no other significant changes in hearing or vision, and only occasionally active with exercise.  Also has recent findings of recurrent bladder ca, for surgury may 3.  Still doing the cardiac rehab and balance class, has helped with less falls (none in past yr).  S/p recent left neck melanoma .  No other interval hx or new complaint Past Medical History:  Diagnosis Date  . Allergic rhinitis 12/16/2016  . Anxiety   . Arthritis    DJD-right knee  . Atrial tachycardia, paroxysmal ([place])   . Bladder cancer [facility]) urologist- dr Tammi Klippel   now recurrent  w/ hx high grade superficial carcinoma in 2015 s/p TURBT and chemo instillation  . CAD (coronary artery disease)    non obstructive, [name], april 2010  . Depression   . Full dentures   . GERD (gastroesophageal reflux disease)   . History of iron deficiency anemia   . Hyperlipidemia   . Hypertension   . Pre-diabetes    March 2017  A1C was 6.3.  She takes no meds   Past Surgical History:  Procedure Laterality Date  . ABDOMINAL HYSTERECTOMY  1970's   w/ bilateral salpingoophorectomy  . CARDIAC CATHETERIZATION  08-22-2008  dr Cathie Olden   mild to moderate coronary artery  irregularies/  dRCA 30%/  ef 60%/  possible coronary spasm  . CARDIOVASCULAR STRESS TEST  08-15-2008  dr Cathie Olden   abnormal stress dual isotope/  BP drop following exercise is concern/ no evidence ischenmia/ LV function normal/ ef 74%  . CATARACT EXTRACTION W/ INTRAOCULAR LENS  IMPLANT, BILATERAL  2013  . CYSTOSCOPY W/ RETROGRADES Bilateral 02/21/2014   Procedure: CYSTOSCOPY WITH RETROGRADE PYELOGRAM, Bilateral ;  Surgeon: [name], MD;  Location: [facility];  Service: Urology;  Laterality: Bilateral;  . CYSTOSCOPY W/ RETROGRADES Bilateral 01/01/2016   Procedure: CYSTOSCOPY WITH RETROGRADE PYELOGRAM;  Surgeon: [name], MD;  Location: [facility];  Service: Urology;  Laterality: Bilateral;  . CYSTOSCOPY W/ URETERAL STENT PLACEMENT Bilateral 01/04/2014   Procedure: CYSTOSCOPY WITH bilateral RETROGRADE PYELOGRAM;  Surgeon: [name], MD;  Location: [facility];  Service: Urology;  Laterality: Bilateral;  . PARTIAL KNEE ARTHROPLASTY Right 06/11/2016   Procedure: RIGHT UNICOMPARTMENTAL KNEE ARTHROPLASTY;  Surgeon: Leandrew Koyanagi, MD;  Location: [place];  Service: Orthopedics;  Laterality: Right;  . TRANSURETHRAL RESECTION OF BLADDER TUMOR N/A 01/01/2016   Procedure: TRANSURETHRAL RESECTION OF BLADDER TUMOR (TURBT) WITH MITOMYCIN;  Surgeon: [name], MD;  Location[facility];  Service: Urology;  Laterality: N/A;  . TRANSURETHRAL RESECTION OF BLADDER TUMOR WITH GYRUS (TURBT-GYRUS) N/A 01/04/2014   Procedure: TRANSURETHRAL RESECTION OF BLADDER TUMOR WITH GYRUS (TURBT-GYRUS);  Surgeon: [name], MD;  Location: [facility];  Service: Urology;  Laterality: N/A;  . TRANSURETHRAL RESECTION OF BLADDER TUMOR WITH GYRUS (TURBT-GYRUS) N/A 02/21/2014   Procedure: TRANSURETHRAL RESECTION OF BLADDER TUMOR WITH GYRUS (TURBT-GYRUS);  Surgeon: [name], MD;  Location: [facility];  Service: Urology;   Laterality: N/A;    reports that she has never smoked. She has never used smokeless tobacco. She reports that she drinks alcohol. She reports that she does not use drugs. family history includes Cancer in her mother; Hypertension in her father. Allergies  Allergen Reactions  . Codeine Nausea And Vomiting  . Penicillins Other (See Comments)    Unknown childhood reaction Has patient had a PCN reaction causing immediate rash, facial/tongue/throat swelling, SOB or lightheadedness with hypotension: Unknown Has patient had a PCN reaction causing severe rash involving mucus membranes or skin necrosis: Unknown Has patient had a PCN reaction that required hospitalization: Unknown Has patient had a PCN reaction occurring within the last 10 years: No If all of the above answers are "NO", then may proceed with Cephalosporin use.    Current Outpatient Medications on File Prior to Visit  Medication Sig Dispense Refill  . aspirin EC 81 MG tablet Take 81 mg by mouth 3 (three) times a week.    . Cholecalciferol (VITAMIN D3) 2000 units TABS Take 2,000 Units by mouth daily.     . Naproxen Sodium (ALEVE) 220 MG CAPS Take 440 mg by mouth 2 (two) times daily as needed (for knee pain.).     Marland Kitchen Omega-3 Fatty Acids (FISH OIL) 1000 MG CAPS Take 1,000 mg by mouth daily.    . vitamin B-12 (CYANOCOBALAMIN) 1000 MCG tablet Take 1,000 mcg by mouth daily.     No current facility-administered medications on file prior to visit.    Review of Systems Constitutional: Negative for other unusual diaphoresis, sweats, appetite or weight changes HENT: Negative for other worsening hearing loss, ear pain, facial swelling, mouth sores or neck stiffness.   Eyes: Negative for other worsening pain, redness or other visual disturbance.  Respiratory: Negative for other stridor or swelling Cardiovascular: Negative for other palpitations or other chest pain  Gastrointestinal: Negative for worsening diarrhea or loose stools, blood in  stool, distention or other pain Genitourinary: Negative for hematuria, flank pain or other change in urine volume.  Musculoskeletal: Negative for myalgias or other joint swelling.  Skin: Negative for other color change, or other wound or worsening drainage.  Neurological: Negative for other syncope or numbness. Hematological: Negative for other adenopathy or swelling Psychiatric/Behavioral: Negative for hallucinations, other worsening agitation, SI, self-injury, or new decreased concentration All other system neg per pt    Objective:   Physical Exam BP 124/82   Pulse 61   Temp 97.9 F (36.6 C) (Oral)   Ht 5\' 6"  (1.676 m)   Wt 171 lb (77.6 kg)   SpO2 99%   BMI 27.60 kg/m  VS noted,  Constitutional: Pt appears in NAD HENT: Head: NCAT.  Right Ear: External ear normal.  Left Ear: External ear normal.  Eyes: . Pupils are equal, round, and reactive to light. Conjunctivae and EOM are normal Nose: without d/c or deformity Neck: Neck supple. Gross normal ROM Cardiovascular: Normal rate and regular rhythm.   Pulmonary/Chest: Effort normal and breath sounds without rales or wheezing.  Abd:  Soft, NT, ND, + BS, no organomegaly Neurological: Pt is alert. At baseline orientation, motor grossly intact Skin: Skin is warm. No rashes, other new lesions, no LE edema Psychiatric: Pt behavior  is normal without agitation  No other exam findings  Lab Results  Component Value Date   WBC 6.9 06/12/2016   HGB 11.4 (L) 06/12/2016   HCT 34.2 (L) 06/12/2016   PLT 159 06/12/2016   GLUCOSE 94 06/04/2016   CHOL 193 07/11/2015   TRIG 132.0 07/11/2015   HDL 62.20 07/11/2015   LDLDIRECT 94.1 06/01/2012   LDLCALC 105 (H) 07/11/2015   ALT 21 06/04/2016   AST 27 06/04/2016   NA 142 06/04/2016   K 3.8 06/04/2016   CL 105 06/04/2016   CREATININE 1.07 (H) 06/04/2016   BUN 20 06/04/2016   CO2 26 06/04/2016   TSH 2.40 07/11/2015   INR 1.00 06/04/2016   HGBA1C 5.9 (H) 06/04/2016   MICROALBUR 13.3 (H)  06/29/2013   Declines further labs today - has preop labs pending later this wk  ECG today I have personally interpreted Sinus Bradycardia 54     Assessment & Plan:

## 2017-08-18 NOTE — Assessment & Plan Note (Signed)
ecg reviewed, cleared for sugury as planned

## 2017-08-19 ENCOUNTER — Encounter (HOSPITAL_COMMUNITY): Payer: Self-pay

## 2017-08-19 NOTE — Progress Notes (Signed)
ekg 09-22-17 epic

## 2017-08-20 ENCOUNTER — Other Ambulatory Visit: Payer: Self-pay

## 2017-08-20 ENCOUNTER — Encounter (HOSPITAL_COMMUNITY): Payer: Self-pay

## 2017-08-20 ENCOUNTER — Encounter (HOSPITAL_COMMUNITY)
Admission: RE | Admit: 2017-08-20 | Discharge: 2017-08-20 | Disposition: A | Discharge status: Home / Self Care | Payer: Medicare Other | Source: Ambulatory Visit | Attending: Urology | Admitting: Urology

## 2017-08-20 DIAGNOSIS — I10 Essential (primary) hypertension: Secondary | ICD-10-CM | POA: Insufficient documentation

## 2017-08-20 DIAGNOSIS — Z01812 Encounter for preprocedural laboratory examination: Secondary | ICD-10-CM | POA: Insufficient documentation

## 2017-08-20 DIAGNOSIS — D494 Neoplasm of unspecified behavior of bladder: Secondary | ICD-10-CM | POA: Insufficient documentation

## 2017-08-20 DIAGNOSIS — I2510 Atherosclerotic heart disease of native coronary artery without angina pectoris: Secondary | ICD-10-CM | POA: Diagnosis not present

## 2017-08-20 LAB — BASIC METABOLIC PANEL
Anion gap: 10 (ref 5–15)
BUN: 23 mg/dL — AB (ref 6–20)
CHLORIDE: 108 mmol/L (ref 101–111)
CO2: 23 mmol/L (ref 22–32)
CREATININE: 0.99 mg/dL (ref 0.44–1.00)
Calcium: 9.1 mg/dL (ref 8.9–10.3)
GFR calc Af Amer: 60 mL/min — ABNORMAL LOW (ref >60)
GFR calc non Af Amer: 52 mL/min — ABNORMAL LOW (ref >60)
GLUCOSE: 113 mg/dL — AB (ref 65–99)
POTASSIUM: 4.8 mmol/L (ref 3.5–5.1)
SODIUM: 141 mmol/L (ref 135–145)

## 2017-08-20 LAB — CBC
HCT: 39.9 % (ref 36.0–46.0)
HEMOGLOBIN: 12.9 g/dL (ref 12.0–15.0)
MCH: 29.9 pg (ref 26.0–34.0)
MCHC: 32.3 g/dL (ref 30.0–36.0)
MCV: 92.6 fL (ref 78.0–100.0)
Platelets: 215 10*3/uL (ref 150–400)
RBC: 4.31 MIL/uL (ref 3.87–5.11)
RDW: 13.5 % (ref 11.5–15.5)
WBC: 6 10*3/uL (ref 4.0–10.5)

## 2017-08-20 LAB — HEMOGLOBIN A1C
Hgb A1c MFr Bld: 5.9 % — ABNORMAL HIGH (ref 4.8–5.6)
Mean Plasma Glucose: 122.63 mg/dL

## 2017-08-25 ENCOUNTER — Other Ambulatory Visit: Payer: Self-pay | Admitting: Urology

## 2017-08-26 ENCOUNTER — Other Ambulatory Visit: Payer: Self-pay | Admitting: Urology

## 2017-08-26 MED ORDER — GENTAMICIN SULFATE 40 MG/ML IJ SOLN
320.0000 mg | INTRAVENOUS | Status: AC
  Administered 2017-08-27: 320 mg via INTRAVENOUS
  Filled 2017-08-26 (×2): qty 8

## 2017-08-27 ENCOUNTER — Encounter (HOSPITAL_COMMUNITY): Payer: Self-pay | Admitting: Anesthesiology

## 2017-08-27 ENCOUNTER — Ambulatory Visit (HOSPITAL_COMMUNITY): Payer: Medicare Other

## 2017-08-27 ENCOUNTER — Ambulatory Visit (HOSPITAL_COMMUNITY): Payer: Medicare Other | Admitting: Anesthesiology

## 2017-08-27 ENCOUNTER — Encounter (HOSPITAL_COMMUNITY)
Admission: RE | Disposition: A | Discharge status: Home / Self Care | Payer: Self-pay | Source: Ambulatory Visit | Attending: Urology

## 2017-08-27 ENCOUNTER — Other Ambulatory Visit: Payer: Self-pay

## 2017-08-27 ENCOUNTER — Ambulatory Visit (HOSPITAL_COMMUNITY)
Admission: RE | Admit: 2017-08-27 | Discharge: 2017-08-27 | Disposition: A | Discharge status: Home / Self Care | Payer: Medicare Other | Source: Ambulatory Visit | Attending: Urology | Admitting: Urology

## 2017-08-27 DIAGNOSIS — C678 Malignant neoplasm of overlapping sites of bladder: Secondary | ICD-10-CM | POA: Insufficient documentation

## 2017-08-27 DIAGNOSIS — Z791 Long term (current) use of non-steroidal anti-inflammatories (NSAID): Secondary | ICD-10-CM | POA: Insufficient documentation

## 2017-08-27 DIAGNOSIS — Z79899 Other long term (current) drug therapy: Secondary | ICD-10-CM | POA: Diagnosis not present

## 2017-08-27 DIAGNOSIS — E785 Hyperlipidemia, unspecified: Secondary | ICD-10-CM | POA: Insufficient documentation

## 2017-08-27 DIAGNOSIS — I2510 Atherosclerotic heart disease of native coronary artery without angina pectoris: Secondary | ICD-10-CM | POA: Insufficient documentation

## 2017-08-27 DIAGNOSIS — D509 Iron deficiency anemia, unspecified: Secondary | ICD-10-CM | POA: Diagnosis not present

## 2017-08-27 DIAGNOSIS — F419 Anxiety disorder, unspecified: Secondary | ICD-10-CM | POA: Insufficient documentation

## 2017-08-27 DIAGNOSIS — I10 Essential (primary) hypertension: Secondary | ICD-10-CM | POA: Diagnosis not present

## 2017-08-27 DIAGNOSIS — F329 Major depressive disorder, single episode, unspecified: Secondary | ICD-10-CM | POA: Insufficient documentation

## 2017-08-27 DIAGNOSIS — C679 Malignant neoplasm of bladder, unspecified: Secondary | ICD-10-CM | POA: Diagnosis present

## 2017-08-27 HISTORY — PX: CYSTOSCOPY W/ RETROGRADES: SHX1426

## 2017-08-27 HISTORY — PX: TRANSURETHRAL RESECTION OF BLADDER TUMOR: SHX2575

## 2017-08-27 SURGERY — TURBT (TRANSURETHRAL RESECTION OF BLADDER TUMOR)
Anesthesia: General

## 2017-08-27 MED ORDER — 0.9 % SODIUM CHLORIDE (POUR BTL) OPTIME
TOPICAL | Status: DC | PRN
  Administered 2017-08-27: 1000 mL

## 2017-08-27 MED ORDER — LIDOCAINE 2% (20 MG/ML) 5 ML SYRINGE
INTRAMUSCULAR | Status: DC | PRN
  Administered 2017-08-27: 100 mg via INTRAVENOUS

## 2017-08-27 MED ORDER — PROPOFOL 10 MG/ML IV BOLUS
INTRAVENOUS | Status: DC | PRN
  Administered 2017-08-27: 150 mg via INTRAVENOUS

## 2017-08-27 MED ORDER — LACTATED RINGERS IV SOLN
INTRAVENOUS | Status: DC
  Administered 2017-08-27: 12:00:00 via INTRAVENOUS

## 2017-08-27 MED ORDER — MEPERIDINE HCL 50 MG/ML IJ SOLN: 6.2500 mg | INTRAMUSCULAR | Status: DC | PRN

## 2017-08-27 MED ORDER — SODIUM CHLORIDE 0.9 % IJ SOLN
INTRAMUSCULAR | Status: AC
  Filled 2017-08-27: qty 50

## 2017-08-27 MED ORDER — METOCLOPRAMIDE HCL 5 MG/ML IJ SOLN: 10.0000 mg | Freq: Once | INTRAMUSCULAR | Status: DC | PRN

## 2017-08-27 MED ORDER — IOHEXOL 300 MG/ML  SOLN
INTRAMUSCULAR | Status: DC | PRN
  Administered 2017-08-27: 10 mL via URETHRAL

## 2017-08-27 MED ORDER — DEXAMETHASONE SODIUM PHOSPHATE 10 MG/ML IJ SOLN
INTRAMUSCULAR | Status: DC | PRN
  Administered 2017-08-27: 10 mg via INTRAVENOUS

## 2017-08-27 MED ORDER — TRAMADOL HCL 50 MG PO TABS: 50.0000 mg | ORAL_TABLET | Freq: Four times a day (QID) | ORAL | 0 refills | Status: DC | PRN

## 2017-08-27 MED ORDER — FENTANYL CITRATE (PF) 100 MCG/2ML IJ SOLN: 25.0000 ug | INTRAMUSCULAR | Status: DC | PRN

## 2017-08-27 MED ORDER — FENTANYL CITRATE (PF) 100 MCG/2ML IJ SOLN
INTRAMUSCULAR | Status: DC | PRN
  Administered 2017-08-27: 25 ug via INTRAVENOUS
  Administered 2017-08-27: 50 ug via INTRAVENOUS
  Administered 2017-08-27: 25 ug via INTRAVENOUS

## 2017-08-27 MED ORDER — SODIUM CHLORIDE 0.9 % IR SOLN
Status: DC | PRN
  Administered 2017-08-27: 3000 mL via INTRAVESICAL

## 2017-08-27 MED ORDER — FENTANYL CITRATE (PF) 100 MCG/2ML IJ SOLN
INTRAMUSCULAR | Status: AC
  Filled 2017-08-27: qty 2

## 2017-08-27 MED ORDER — ONDANSETRON HCL 4 MG/2ML IJ SOLN
INTRAMUSCULAR | Status: DC | PRN
  Administered 2017-08-27: 4 mg via INTRAVENOUS

## 2017-08-27 SURGICAL SUPPLY — 32 items
BAG URINE DRAINAGE (UROLOGICAL SUPPLIES) IMPLANT
BAG URO CATCHER STRL LF (MISCELLANEOUS) ×3 IMPLANT
BASKET LASER NITINOL 1.9FR (BASKET) IMPLANT
BSKT STON RTRVL 120 1.9FR (BASKET)
CATH INTERMIT  6FR 70CM (CATHETERS) ×3 IMPLANT
CLOTH BEACON ORANGE TIMEOUT ST (SAFETY) ×3 IMPLANT
COVER FOOTSWITCH UNIV (MISCELLANEOUS) IMPLANT
COVER SURGICAL LIGHT HANDLE (MISCELLANEOUS) ×3 IMPLANT
ELECT REM PT RETURN 15FT ADLT (MISCELLANEOUS) ×3 IMPLANT
EVACUATOR MICROVAS BLADDER (UROLOGICAL SUPPLIES) IMPLANT
FIBER LASER FLEXIVA 1000 (UROLOGICAL SUPPLIES) IMPLANT
FIBER LASER FLEXIVA 365 (UROLOGICAL SUPPLIES) IMPLANT
FIBER LASER FLEXIVA 550 (UROLOGICAL SUPPLIES) IMPLANT
FIBER LASER TRAC TIP (UROLOGICAL SUPPLIES) IMPLANT
GLOVE BIOGEL M STRL SZ7.5 (GLOVE) ×3 IMPLANT
GOWN STRL REUS W/TWL LRG LVL3 (GOWN DISPOSABLE) ×6 IMPLANT
GUIDEWIRE ANG ZIPWIRE 038X150 (WIRE) ×3 IMPLANT
GUIDEWIRE STR DUAL SENSOR (WIRE) ×3 IMPLANT
IV NS 1000ML (IV SOLUTION) ×3
IV NS 1000ML BAXH (IV SOLUTION) ×2 IMPLANT
LOOP CUT BIPOLAR 24F LRG (ELECTROSURGICAL) IMPLANT
MANIFOLD NEPTUNE II (INSTRUMENTS) ×3 IMPLANT
PACK CYSTO (CUSTOM PROCEDURE TRAY) ×3 IMPLANT
SET ASPIRATION TUBING (TUBING) IMPLANT
SHEATH ACCESS URETERAL 24CM (SHEATH) IMPLANT
SHEATH ACCESS URETERAL 54CM (SHEATH) IMPLANT
SHEATH URETERAL 12FRX35CM (MISCELLANEOUS) IMPLANT
SYR CONTROL 10ML LL (SYRINGE) ×3 IMPLANT
SYRINGE IRR TOOMEY STRL 70CC (SYRINGE) IMPLANT
TUBE FEEDING 8FR 16IN STR KANG (MISCELLANEOUS) ×3 IMPLANT
TUBING CONNECTING 10 (TUBING) ×3 IMPLANT
TUBING UROLOGY SET (TUBING) ×3 IMPLANT

## 2017-08-27 NOTE — H&P (Signed)
Rhonda Norman is an 84 y.o. female.    Chief Complaint: Pre-OP Transurethral Resection of Bladder Tumor  HPI:   1- High Grade Superficial Bladder Cancer- initially found on hematuria eval 11/2013. Non smoker. No dye/textile/solvent exposure.    Recent Course:   12/2013 TaG3 (large volume 7cm Rt wall) by TURBT --> re-resection 02/2014 no evidence of disease --> Full induction BCG x6   06/2014 Cysto no recurrence; 10/2014 Cysto no gross recurrence, some dome erythema (<1cm); 01/2015 Cysto NED / stable small dome erythema   05/2015 Cysto NED; 08/2015 Cysto NED; 11/2015 Cysto 11mm dome recurrence, new erythema superior-medial edge old right resection site.   12/2015 - TaG1 tiny dome lesion at TURBT / Mito-C (old resection site benign, muscle present) ==> continue Q23mo cysto per pr request   07/2016 cysto NED; 01/2017 cysto NED   07/2017 cysto approx 2cm total small, very subtle left wall papillary tumors.    PMH sig for benign hyst, knee replacement. No CV disease. No strong blood thinners. Her PCP is Rhonda Cower MD with [facility].    Today Rhonda Norman is seen to proceed with elective TURBT for recurrent bladder cancer. Most recent UCX negative.     Past Medical History:  Diagnosis Date  . Allergic rhinitis 12/16/2016  . Anxiety   . Arthritis    DJD-right knee  . Atrial tachycardia, paroxysmal ([place])   . Bladder cancer [facility]) urologist- dr Tammi Klippel   now recurrent  w/ hx high grade superficial carcinoma in 2015 s/p TURBT and chemo instillation  . CAD (coronary artery disease)    non obstructive, [name], april 2010  . Depression   . Full dentures   . GERD (gastroesophageal reflux disease)   . History of iron deficiency anemia   . Hyperlipidemia   . Hypertension   . Pre-diabetes    March 2017  A1C was 6.3.  She takes no meds    Past Surgical History:  Procedure Laterality Date  . ABDOMINAL HYSTERECTOMY  1970's   w/ bilateral salpingoophorectomy  . CARDIAC CATHETERIZATION  08-22-2008   dr Cathie Olden   mild to moderate coronary artery irregularies/  dRCA 30%/  ef 60%/  possible coronary spasm  . CARDIOVASCULAR STRESS TEST  08-15-2008  dr Cathie Olden   abnormal stress dual isotope/  BP drop following exercise is concern/ no evidence ischenmia/ LV function normal/ ef 74%  . CATARACT EXTRACTION W/ INTRAOCULAR LENS  IMPLANT, BILATERAL  2013  . CYSTOSCOPY W/ RETROGRADES Bilateral 02/21/2014   Procedure: CYSTOSCOPY WITH RETROGRADE PYELOGRAM, Bilateral ;  Surgeon: [name], MD;  Location: [facility];  Service: Urology;  Laterality: Bilateral;  . CYSTOSCOPY W/ RETROGRADES Bilateral 01/01/2016   Procedure: CYSTOSCOPY WITH RETROGRADE PYELOGRAM;  Surgeon: [name], MD;  Location: [facility];  Service: Urology;  Laterality: Bilateral;  . CYSTOSCOPY W/ URETERAL STENT PLACEMENT Bilateral 01/04/2014   Procedure: CYSTOSCOPY WITH bilateral RETROGRADE PYELOGRAM;  Surgeon: [name], MD;  Location: [facility];  Service: Urology;  Laterality: Bilateral;  . PARTIAL KNEE ARTHROPLASTY Right 06/11/2016   Procedure: RIGHT UNICOMPARTMENTAL KNEE ARTHROPLASTY;  Surgeon: Leandrew Koyanagi, MD;  Location: [place];  Service: Orthopedics;  Laterality: Right;  . TRANSURETHRAL RESECTION OF BLADDER TUMOR N/A 01/01/2016   Procedure: TRANSURETHRAL RESECTION OF BLADDER TUMOR (TURBT) WITH MITOMYCIN;  Surgeon: [name], MD;  Location: [facility];  Service: Urology;  Laterality: N/A;  . TRANSURETHRAL RESECTION OF BLADDER TUMOR WITH GYRUS (TURBT-GYRUS) N/A 01/04/2014   Procedure:  TRANSURETHRAL RESECTION OF BLADDER TUMOR WITH GYRUS (TURBT-GYRUS);  Surgeon: [name], MD;  Location: [facility];  Service: Urology;  Laterality: N/A;  . TRANSURETHRAL RESECTION OF BLADDER TUMOR WITH GYRUS (TURBT-GYRUS) N/A 02/21/2014   Procedure: TRANSURETHRAL RESECTION OF BLADDER TUMOR WITH GYRUS (TURBT-GYRUS);  Surgeon: [name], MD;  Location: [facility];  Service: Urology;  Laterality: N/A;    Family History  Problem Relation Age of Onset  . Cancer Mother   . Hypertension Father    Social History:  reports that she has never smoked. She has never used smokeless tobacco. She reports that she drinks alcohol. She reports that she does not use drugs.  Allergies:  Allergies  Allergen Reactions  . Codeine Nausea And Vomiting  . Penicillins Other (See Comments)    Unknown childhood reaction Has patient had a PCN reaction causing immediate rash, facial/tongue/throat swelling, SOB or lightheadedness with hypotension: Unknown Has patient had a PCN reaction causing severe rash involving mucus membranes or skin necrosis: Unknown Has patient had a PCN reaction that required hospitalization: Unknown Has patient had a PCN reaction occurring within the last 10 years: No If all of the above answers are "NO", then may proceed with Cephalosporin use.     No medications prior to admission.    No results found for this or any previous visit (from the past 48 hour(s)). No results found.  Review of Systems  Constitutional: Negative.  Negative for fever.  HENT: Negative.   Eyes: Negative.   Respiratory: Negative.   Cardiovascular: Negative.   Gastrointestinal: Negative.   Genitourinary: Negative.  Negative for flank pain.  Musculoskeletal: Negative.   Skin: Negative.   Neurological: Negative.   Endo/Heme/Allergies: Negative.   Psychiatric/Behavioral: Negative.     There were no vitals taken for this visit. Physical Exam  Constitutional: She appears well-developed.  HENT:  Head: Normocephalic.  Eyes: Pupils are equal, round, and reactive to light.  Neck: Normal range of motion.  Cardiovascular: Normal rate.  Respiratory: Effort normal.  GI: Soft.  Genitourinary:  Genitourinary Comments: No CVAT  Musculoskeletal: Normal range of motion.  Neurological: She is alert.  Skin: Skin is warm.  Psychiatric: She has a  normal mood and affect.     Assessment/Plan  Proceed as planned with TURBT / retrogrades with blue light given h/o multifocality. Risks, benefits, alternatives, expected peri-op course discussed previously and reiterated today.   [name], MD 08/27/2017, 7:22 AM

## 2017-08-27 NOTE — Discharge Instructions (Signed)
1 - You may have urinary urgency (bladder spasms) and bloody urine on / off x few days. This is normal. ° °2 - Call MD or go to ER for fever >102, severe pain / nausea / vomiting not relieved by medications, or acute change in medical status ° °

## 2017-08-27 NOTE — Anesthesia Preprocedure Evaluation (Signed)
Anesthesia Evaluation  Patient identified by MRN, date of birth, ID band Patient awake    Reviewed: Allergy & Precautions, NPO status , Patient's Chart, lab work & pertinent test results  Airway Mallampati: II  TM Distance: >3 FB Neck ROM: Full    Dental no notable dental hx.    Pulmonary neg pulmonary ROS,    Pulmonary exam normal breath sounds clear to auscultation       Cardiovascular hypertension, Pt. on medications and Pt. on home beta blockers + CAD  Normal cardiovascular exam+ dysrhythmias Supra Ventricular Tachycardia  Rhythm:Regular Rate:Normal     Neuro/Psych PSYCHIATRIC DISORDERS Anxiety Depression negative neurological ROS     GI/Hepatic Neg liver ROS, GERD  ,  Endo/Other  diabetes, Type 2  Renal/GU Renal disease  negative genitourinary   Musculoskeletal  (+) Arthritis ,   Abdominal   Peds negative pediatric ROS (+)  Hematology  (+) anemia ,   Anesthesia Other Findings   Reproductive/Obstetrics negative OB ROS                            Anesthesia Physical  Anesthesia Plan  ASA: III  Anesthesia Plan: General   Post-op Pain Management:    Induction: Intravenous  PONV Risk Score and Plan: 3 and Ondansetron and Treatment may vary due to age or medical condition  Airway Management Planned: LMA  Additional Equipment:   Intra-op Plan:   Post-operative Plan: Extubation in OR  Informed Consent: I have reviewed the patients History and Physical, chart, labs and discussed the procedure including the risks, benefits and alternatives for the proposed anesthesia with the patient or authorized representative who has indicated his/her understanding and acceptance.   Dental advisory given  Plan Discussed with: CRNA  Anesthesia Plan Comments:         Anesthesia Quick Evaluation

## 2017-08-27 NOTE — Brief Op Note (Signed)
08/27/2017  2:49 PM  PATIENT:  Rhonda Norman  84 y.o. female  PRE-OPERATIVE DIAGNOSIS:  RECURRENT BLADDER CANCER  POST-OPERATIVE DIAGNOSIS:  RECURRENT BLADDER CANCER  PROCEDURE:  Procedure(s): TRANSURETHRAL RESECTION OF BLADDER TUMOR (TURBT) (N/A) BLUE LIGHT CYSTOSCOPY WITH CYSCVIEW WITH LEFT RETROGRADE PYLOGRAM (Left)  SURGEON:  Surgeon(s) and Role:    * [name], MD - Primary  PHYSICIAN ASSISTANT:   ASSISTANTS: none   ANESTHESIA:   general  EBL:  Minimal  BLOOD ADMINISTERED:none  DRAINS: none   LOCAL MEDICATIONS USED:  NONE  SPECIMEN:  Source of Specimen:  bladder tumor fragments  DISPOSITION OF SPECIMEN:  PATHOLOGY  COUNTS:  YES  TOURNIQUET:  * No tourniquets in log *  DICTATION: .Other Dictation: Dictation Number [id number]  PLAN OF CARE: Discharge to home after PACU  PATIENT DISPOSITION:  PACU - hemodynamically stable.   Delay start of Pharmacological VTE agent (>24hrs) due to surgical blood loss or risk of bleeding: yes

## 2017-08-27 NOTE — Anesthesia Procedure Notes (Signed)
Procedure Name: LMA Insertion Date/Time: 08/27/2017 2:17 PM Performed by: Johnathon Mittal D, CRNA Pre-anesthesia Checklist: Patient identified, Emergency Drugs available, Suction available and Patient being monitored Patient Re-evaluated:Patient Re-evaluated prior to induction Oxygen Delivery Method: Circle system utilized Preoxygenation: Pre-oxygenation with 100% oxygen Induction Type: IV induction Ventilation: Mask ventilation without difficulty LMA: LMA inserted LMA Size: 4.0 Tube type: Oral Number of attempts: 1 Placement Confirmation: positive ETCO2 and breath sounds checked- equal and bilateral Tube secured with: Tape Dental Injury: Teeth and Oropharynx as per pre-operative assessment

## 2017-08-27 NOTE — Op Note (Signed)
NAME: Rhonda Norman, Rhonda Norman MEDICAL RECORD UX:[id number] ACCOUNT [id number] DATE OF BIRTH:[birth year] FACILITY: [facility] LOCATION: [facility] PHYSICIAN:Chizaram Latino [name], MD  OPERATIVE REPORT  DATE OF PROCEDURE:  08/27/2017  PREOPERATIVE DIAGNOSIS:  Recurrent bladder cancer.   PROCEDURES:  1.  Transurethral resection of bladder tumor, volume small. 2.  Left retrograde pyelogram, interpretation.  ESTIMATED BLOOD LOSS:  Nil.   COMPLICATIONS:  None.  SPECIMENS:  Small bladder tumor fragments for permanent pathology.  FINDINGS: 1.  Multifocal small volume recurrence of bladder tumor in all quadrants.  Total surface area approximately 2 sq cm. 2.  Hyperfluorescence of bladder tumors as expected with Cysview. 3.  Complete resolution of all hyperfluorescing or visible papillary tumors following transurethral resection. 4.  Unremarkable left retrograde pyelogram.  INDICATIONS:  The patient is a pleasant 84 year old lady with recent history of recurrent bladder cancer.  This had been high grade initially, followed BCG induction and then converted fortunately to low grade tumor.  She has been very compliant with  followup.  She was found on most recent cystoscopic exam to have multifocal recurrence, although a small volume of papillary bladder tumors.  Options were discussed including recommended path of transurethral resection.  She wished to proceed.  Given the  multifocal nature and somewhat subtle nature, it was felt that Cysview installation with fluorescence guidance would be warranted.  Informed consent was obtained and placed in the medical record.  DESCRIPTION OF PROCEDURE:  The patient being identified, appropriate procedure being transurethral resection of bladder tumor, retrograde was confirmed.  Procedure timeout was performed, IV antibiotics administered, general anesthesia induced.  The  patient was placed into a low lithotomy position, sterilely scrubbed, prepped and draped at the base  of the vagina, introitus and Bartholins using iodine.  A cystourethroscopy was performed using a 22-French rigid cystoscope with offset lens.  Inspection  of the bladder revealed no diverticula or calcifications.  There are multifocal, very small volume bladder tumors noted in all quadrants.  Some of these were just several millimeters, others approximately 5-6 mm in size.  Total volume approximately 2 sq  cm.  The left ureteral orifice was cannulated with a 6-French catheter and left retrograde pyelogram was obtained.   Left retrograde pyelogram demonstrated a single left ureter, single system left kidney.  No filling defects or narrowing noted.  The right ureteral orifice was unable to be appreciated due to prior resection site.  Multiple angulations were attempted to  cannulate, but not successful.  The scope was then exchanged for a 26-French resectoscope sheath with visual obturator and the bladder was inspected under near infrared fluorescence light.  Cystoscopy revealed again multifocal small volume bladder  tumors.  Using a combination of white light and fluorescence light, each of the tumor sites was carefully resected down to what appeared to be the superficial fibromuscular stroma of the urinary bladder.  Given the small size of these tumors, it was not  felt that deep resection or separate muscle specimens would be necessary.  The specimens were then irrigated and set aside for pathology.  The base of these areas was fulgurated.  This revealed an excellent hemostasis.  Final white light and fluorescence  light inspection of the urinary bladder revealed complete resolution of all sites of bladder tumors.  Bladder was emptied per cystoscopy and the procedure was then terminated.  The patient tolerated procedure well with no immediate perineal complications.  The patient was  taken to post anesthesia care in stable condition.  GN/NUANCE  D:08/27/2017 T:08/27/2017 JOB:[id number]

## 2017-08-27 NOTE — Transfer of Care (Signed)
Immediate Anesthesia Transfer of Care Note  Patient: Rhonda Norman  Procedure(s) Performed: TRANSURETHRAL RESECTION OF BLADDER TUMOR (TURBT) (N/A ) BLUE LIGHT CYSTOSCOPY WITH CYSCVIEW WITH LEFT RETROGRADE PYLOGRAM (Left )  Patient Location: PACU  Anesthesia Type:General  Level of Consciousness: awake, alert  and oriented  Airway & Oxygen Therapy: Patient Spontanous Breathing and Patient connected to face mask oxygen  Post-op Assessment: Report given to RN and Post -op Vital signs reviewed and stable  Post vital signs: Reviewed and stable  Last Vitals:  Vitals Value Taken Time  BP 154/79 08/27/2017  3:00 PM  Temp    Pulse 77 08/27/2017  3:02 PM  Resp 12 08/27/2017  3:02 PM  SpO2 100 % 08/27/2017  3:02 PM  Vitals shown include unvalidated device data.  Last Pain:  Vitals:   08/27/17 1128  TempSrc: Oral      Patients Stated Pain Goal: 3 (32/35/57 3220)  Complications: No apparent anesthesia complications

## 2017-08-30 ENCOUNTER — Encounter (HOSPITAL_COMMUNITY): Payer: Self-pay | Admitting: Urology

## 2017-08-30 NOTE — Anesthesia Postprocedure Evaluation (Signed)
Anesthesia Post Note  Patient: Rhonda Norman  Procedure(s) Performed: TRANSURETHRAL RESECTION OF BLADDER TUMOR (TURBT) (N/A ) BLUE LIGHT CYSTOSCOPY WITH CYSCVIEW WITH LEFT RETROGRADE PYLOGRAM (Left )     Patient location during evaluation: PACU Anesthesia Type: General Level of consciousness: awake and alert Pain management: pain level controlled Vital Signs Assessment: post-procedure vital signs reviewed and stable Respiratory status: spontaneous breathing, nonlabored ventilation, respiratory function stable and patient connected to nasal cannula oxygen Cardiovascular status: blood pressure returned to baseline and stable Postop Assessment: no apparent nausea or vomiting Anesthetic complications: no    Last Vitals:  Vitals:   08/27/17 1526 08/27/17 1545  BP: (!) 146/74 (!) 162/87  Pulse: (!) 55 60  Resp: 13 18  Temp: 36.6 C 36.7 C  SpO2: 100% 99%    Last Pain:  Vitals:   08/27/17 1545  TempSrc:   PainSc: 0-No pain                 Montez Hageman

## 2017-10-27 ENCOUNTER — Encounter: Payer: Self-pay | Admitting: Internal Medicine

## 2017-10-27 ENCOUNTER — Ambulatory Visit: Payer: Medicare Other | Admitting: Internal Medicine

## 2017-10-27 VITALS — BP 118/76 | HR 61 | Temp 98.8°F | Ht 66.0 in | Wt 172.0 lb

## 2017-10-27 DIAGNOSIS — J069 Acute upper respiratory infection, unspecified: Secondary | ICD-10-CM | POA: Diagnosis not present

## 2017-10-27 DIAGNOSIS — E119 Type 2 diabetes mellitus without complications: Secondary | ICD-10-CM

## 2017-10-27 DIAGNOSIS — I10 Essential (primary) hypertension: Secondary | ICD-10-CM | POA: Diagnosis not present

## 2017-10-27 MED ORDER — AZITHROMYCIN 250 MG PO TABS: ORAL_TABLET | ORAL | 1 refills | Status: DC

## 2017-10-27 NOTE — Patient Instructions (Signed)
Please take all new medication as prescribed - the antibiotic  You can also take Mucinex (or it's generic off brand) for congestion, and tylenol as needed for pain, and Nasacort OTC for any sinus congestion  Please continue all other medications as before, and refills have been done if requested.  Please have the pharmacy call with any other refills you may need.  Please keep your appointments with your specialists as you may have planned

## 2017-10-27 NOTE — Assessment & Plan Note (Signed)
Lab Results  Component Value Date   HGBA1C 5.9 (H) 08/20/2017  stable overall by history and exam, recent data reviewed with pt, and pt to continue medical treatment as before,  to f/u any worsening symptoms or concerns

## 2017-10-27 NOTE — Assessment & Plan Note (Signed)
stable overall by history and exam, recent data reviewed with pt, and pt to continue medical treatment as before,  to f/u any worsening symptoms or concerns BP Readings from Last 3 Encounters:  10/27/17 118/76  08/27/17 (!) 162/87  08/20/17 (!) 150/68

## 2017-10-27 NOTE — Assessment & Plan Note (Signed)
Mild to mod, for antibx course,  to f/u any worsening symptoms or concerns 

## 2017-10-27 NOTE — Progress Notes (Signed)
Subjective:    Patient ID: Rhonda Norman, female    DOB: [birth year], 84 y.o.   MRN: [id number]  HPI   Here with 2-3 days acute onset fever, facial pain, left ear pain and pressure, headache, general weakness and malaise, awith mild ST and cough and dizziness, but pt states antivert not helping, has no sinus congestion, and denies chest pain, wheezing, increased sob or doe, orthopnea, PND, increased LE swelling, palpitations, dizziness or syncope.  Pt denies new neurological symptoms such as new headache, or facial or extremity weakness or numbness   Pt denies polydipsia, polyuria.  Past Medical History:  Diagnosis Date  . Allergic rhinitis 12/16/2016  . Anxiety   . Arthritis    DJD-right knee  . Atrial tachycardia, paroxysmal ([place])   . Bladder cancer [facility]) urologist- dr Tammi Klippel   now recurrent  w/ hx high grade superficial carcinoma in 2015 s/p TURBT and chemo instillation  . CAD (coronary artery disease)    non obstructive, [name], april 2010  . Depression   . Full dentures   . GERD (gastroesophageal reflux disease)   . History of iron deficiency anemia   . Hyperlipidemia   . Hypertension   . Pre-diabetes    March 2017  A1C was 6.3.  She takes no meds   Past Surgical History:  Procedure Laterality Date  . ABDOMINAL HYSTERECTOMY  1970's   w/ bilateral salpingoophorectomy  . CARDIAC CATHETERIZATION  08-22-2008  dr Cathie Olden   mild to moderate coronary artery irregularies/  dRCA 30%/  ef 60%/  possible coronary spasm  . CARDIOVASCULAR STRESS TEST  08-15-2008  dr Cathie Olden   abnormal stress dual isotope/  BP drop following exercise is concern/ no evidence ischenmia/ LV function normal/ ef 74%  . CATARACT EXTRACTION W/ INTRAOCULAR LENS  IMPLANT, BILATERAL  2013  . CYSTOSCOPY W/ RETROGRADES Bilateral 02/21/2014   Procedure: CYSTOSCOPY WITH RETROGRADE PYELOGRAM, Bilateral ;  Surgeon: [name], MD;  Location: [facility];  Service: Urology;  Laterality: Bilateral;   . CYSTOSCOPY W/ RETROGRADES Bilateral 01/01/2016   Procedure: CYSTOSCOPY WITH RETROGRADE PYELOGRAM;  Surgeon: [name], MD;  Location: [facility];  Service: Urology;  Laterality: Bilateral;  . CYSTOSCOPY W/ RETROGRADES Left 08/27/2017   Procedure: BLUE LIGHT CYSTOSCOPY WITH CYSCVIEW WITH LEFT RETROGRADE ITGPQDIY;  Surgeon: [name], MD;  Location: [facility];  Service: Urology;  Laterality: Left;  . CYSTOSCOPY W/ URETERAL STENT PLACEMENT Bilateral 01/04/2014   Procedure: CYSTOSCOPY WITH bilateral RETROGRADE PYELOGRAM;  Surgeon: [name], MD;  Location: [facility];  Service: Urology;  Laterality: Bilateral;  . PARTIAL KNEE ARTHROPLASTY Right 06/11/2016   Procedure: RIGHT UNICOMPARTMENTAL KNEE ARTHROPLASTY;  Surgeon: Leandrew Koyanagi, MD;  Location: [place];  Service: Orthopedics;  Laterality: Right;  . TRANSURETHRAL RESECTION OF BLADDER TUMOR N/A 01/01/2016   Procedure: TRANSURETHRAL RESECTION OF BLADDER TUMOR (TURBT) WITH MITOMYCIN;  Surgeon: [name], MD;  Location: [facility];  Service: Urology;  Laterality: N/A;  . TRANSURETHRAL RESECTION OF BLADDER TUMOR N/A 08/27/2017   Procedure: TRANSURETHRAL RESECTION OF BLADDER TUMOR (TURBT);  Surgeon: [name], MD;  Location: [facility];  Service: Urology;  Laterality: N/A;  . TRANSURETHRAL RESECTION OF BLADDER TUMOR WITH GYRUS (TURBT-GYRUS) N/A 01/04/2014   Procedure: TRANSURETHRAL RESECTION OF BLADDER TUMOR WITH GYRUS (TURBT-GYRUS);  Surgeon: [name], MD;  Location: [facility];  Service: Urology;  Laterality: N/A;  . TRANSURETHRAL RESECTION OF BLADDER TUMOR WITH GYRUS (TURBT-GYRUS) N/A 02/21/2014  Procedure: TRANSURETHRAL RESECTION OF BLADDER TUMOR WITH GYRUS (TURBT-GYRUS);  Surgeon: [name], MD;  Location: [facility];  Service: Urology;  Laterality: N/A;    reports that she has never smoked. She has never used smokeless tobacco. She reports that she  drinks alcohol. She reports that she does not use drugs. family history includes Cancer in her mother; Hypertension in her father. Allergies  Allergen Reactions  . Codeine Nausea And Vomiting  . Penicillins Other (See Comments)    Unknown childhood reaction Has patient had a PCN reaction causing immediate rash, facial/tongue/throat swelling, SOB or lightheadedness with hypotension: Unknown Has patient had a PCN reaction causing severe rash involving mucus membranes or skin necrosis: Unknown Has patient had a PCN reaction that required hospitalization: Unknown Has patient had a PCN reaction occurring within the last 10 years: No If all of the above answers are "NO", then may proceed with Cephalosporin use.    Current Outpatient Medications on File Prior to Visit  Medication Sig Dispense Refill  . amLODipine (NORVASC) 5 MG tablet TAKE ONE TABLET BY MOUTH ONCE DAILY IN THE MORNING 90 tablet 3  . aspirin EC 81 MG tablet Take 81 mg by mouth 3 (three) times a week.    Marland Kitchen atorvastatin (LIPITOR) 10 MG tablet Take 1 tablet (10 mg total) by mouth daily. 90 tablet 3  . Cholecalciferol (VITAMIN D3) 2000 units TABS Take 2,000 Units by mouth daily.     . ferrous sulfate 325 (65 FE) MG tablet Take 1 tablet (325 mg total) by mouth daily with breakfast. 90 tablet 3  . irbesartan (AVAPRO) 300 MG tablet Take 1 tablet (300 mg total) by mouth daily. 90 tablet 3  . metoprolol tartrate (LOPRESSOR) 25 MG tablet Take 0.5 tablets (12.5 mg total) by mouth 2 (two) times daily. 90 tablet 3  . Naproxen Sodium (ALEVE) 220 MG CAPS Take 440 mg by mouth 2 (two) times daily as needed (for knee pain.).     Marland Kitchen Omega-3 Fatty Acids (FISH OIL) 1000 MG CAPS Take 1,000 mg by mouth daily.    . QUEtiapine (SEROQUEL) 50 MG tablet Take 1 tablet (50 mg total) by mouth at bedtime as needed. 90 tablet 1  . traMADol (ULTRAM) 50 MG tablet TAKE 1 TABLET BY MOUTH TWICE DAILY AS NEEDED FOR MODERATE PAIN.  POST-OPERATIVELY 60 tablet 3  . traMADol  (ULTRAM) 50 MG tablet Take 1 tablet (50 mg total) by mouth every 6 (six) hours as needed for moderate pain. Post-operatively 15 tablet 0  . vitamin B-12 (CYANOCOBALAMIN) 1000 MCG tablet Take 1,000 mcg by mouth daily.     No current facility-administered medications on file prior to visit.    Review of Systems  Constitutional: Negative for other unusual diaphoresis or sweats HENT: Negative for ear discharge or swelling Eyes: Negative for other worsening visual disturbances Respiratory: Negative for stridor or other swelling  Gastrointestinal: Negative for worsening distension or other blood Genitourinary: Negative for retention or other urinary change Musculoskeletal: Negative for other MSK pain or swelling Skin: Negative for color change or other new lesions Neurological: Negative for worsening tremors and other numbness  Psychiatric/Behavioral: Negative for worsening agitation or other fatigue All other system neg per pt    Objective:   Physical Exam BP 118/76   Pulse 61   Temp 98.8 F (37.1 C) (Oral)   Ht 5\' 6"  (1.676 m)   Wt 172 lb (78 kg)   SpO2 97%   BMI 27.76 kg/m  VS  noted, mild ill Constitutional: Pt appears in NAD HENT: Head: NCAT.  Right Ear: External ear normal.  Left Ear: External ear normal.  Bilat tm's with mild erythema.  Max sinus areas non tender.  Pharynx with mild erythema, no exudate Eyes: . Pupils are equal, round, and reactive to light. Conjunctivae and EOM are normal Nose: without d/c or deformity Neck: Neck supple. Gross normal ROM Cardiovascular: Normal rate and regular rhythm.   Pulmonary/Chest: Effort normal and breath sounds without rales or wheezing.  Abd:  Soft, NT, ND, + BS, no organomegaly Neurological: Pt is alert. At baseline orientation, motor grossly intact Skin: Skin is warm. No rashes, other new lesions, no LE edema Psychiatric: Pt behavior is normal without agitation  No other exam findings Lab Results  Component Value Date   WBC  6.0 08/20/2017   HGB 12.9 08/20/2017   HCT 39.9 08/20/2017   PLT 215 08/20/2017   GLUCOSE 113 (H) 08/20/2017   CHOL 193 07/11/2015   TRIG 132.0 07/11/2015   HDL 62.20 07/11/2015   LDLDIRECT 94.1 06/01/2012   LDLCALC 105 (H) 07/11/2015   ALT 21 06/04/2016   AST 27 06/04/2016   NA 141 08/20/2017   K 4.8 08/20/2017   CL 108 08/20/2017   CREATININE 0.99 08/20/2017   BUN 23 (H) 08/20/2017   CO2 23 08/20/2017   TSH 2.40 07/11/2015   INR 1.00 06/04/2016   HGBA1C 5.9 (H) 08/20/2017   MICROALBUR 13.3 (H) 06/29/2013       Assessment & Plan:

## 2017-12-06 ENCOUNTER — Ambulatory Visit: Payer: Medicare Other | Admitting: Internal Medicine

## 2017-12-06 ENCOUNTER — Encounter: Payer: Self-pay | Admitting: Internal Medicine

## 2017-12-06 VITALS — BP 128/80 | HR 59 | Temp 98.0°F | Ht 66.0 in | Wt 171.0 lb

## 2017-12-06 DIAGNOSIS — H6593 Unspecified nonsuppurative otitis media, bilateral: Secondary | ICD-10-CM

## 2017-12-06 DIAGNOSIS — I10 Essential (primary) hypertension: Secondary | ICD-10-CM | POA: Diagnosis not present

## 2017-12-06 DIAGNOSIS — E119 Type 2 diabetes mellitus without complications: Secondary | ICD-10-CM

## 2017-12-06 MED ORDER — LEVOFLOXACIN 500 MG PO TABS: 500.0000 mg | ORAL_TABLET | Freq: Every day | ORAL | 0 refills | Status: AC

## 2017-12-06 MED FILL — levoFLOXacin 500 MG TABS: 500 | 10 days supply | Qty: 10 | Fill #0

## 2017-12-06 NOTE — Assessment & Plan Note (Signed)
Mild to mod, for antibx course,  to f/u any worsening symptoms or concerns 

## 2017-12-06 NOTE — Assessment & Plan Note (Signed)
stable overall by history and exam, recent data reviewed with pt, and pt to continue medical treatment as before,  to f/u any worsening symptoms or concerns  

## 2017-12-06 NOTE — Assessment & Plan Note (Signed)
Lab Results  Component Value Date   HGBA1C 5.9 (H) 08/20/2017  stable overall by history and exam, recent data reviewed with pt, and pt to continue medical treatment as before,  to f/u any worsening symptoms or concerns

## 2017-12-06 NOTE — Progress Notes (Signed)
Subjective:    Patient ID: Rhonda Norman, female    DOB: Oct 06, 1932, 84 y.o.   MRN: [id number]  HPI Here with 2-3 days acute onset fever, severe left > right ear pain, pressure, headache, general weakness and malaise, and non prod cough, but pt denies chest pain, wheezing, increased sob or doe, orthopnea, PND, increased LE swelling, palpitations, dizziness or syncope.  Pt denies new neurological symptoms such as new headache, or facial or extremity weakness or numbness   Pt denies polydipsia, polyuria   So dtill with left ear discomfort and dizziness,  Presusre in both ears, off balance and close to falling down.  No fever, Also \\dry  cough, no sinus congestion.     Not better with mucinex.   Past Medical History:  Diagnosis Date  . Allergic rhinitis 12/16/2016  . Anxiety   . Arthritis    DJD-right knee  . Atrial tachycardia, paroxysmal ([place])   . Bladder cancer [facility]) urologist- dr Tammi Klippel   now recurrent  w/ hx high grade superficial carcinoma in 2015 s/p TURBT and chemo instillation  . CAD (coronary artery disease)    non obstructive, [name], april 2010  . Depression   . Full dentures   . GERD (gastroesophageal reflux disease)   . History of iron deficiency anemia   . Hyperlipidemia   . Hypertension   . Pre-diabetes    March 2017  A1C was 6.3.  She takes no meds   Past Surgical History:  Procedure Laterality Date  . ABDOMINAL HYSTERECTOMY  1970's   w/ bilateral salpingoophorectomy  . CARDIAC CATHETERIZATION  08-22-2008  dr Cathie Olden   mild to moderate coronary artery irregularies/  dRCA 30%/  ef 60%/  possible coronary spasm  . CARDIOVASCULAR STRESS TEST  08-15-2008  dr Cathie Olden   abnormal stress dual isotope/  BP drop following exercise is concern/ no evidence ischenmia/ LV function normal/ ef 74%  . CATARACT EXTRACTION W/ INTRAOCULAR LENS  IMPLANT, BILATERAL  2013  . CYSTOSCOPY W/ RETROGRADES Bilateral 02/21/2014   Procedure: CYSTOSCOPY WITH RETROGRADE PYELOGRAM, Bilateral ;   Surgeon: [name], MD;  Location: [facility];  Service: Urology;  Laterality: Bilateral;  . CYSTOSCOPY W/ RETROGRADES Bilateral 01/01/2016   Procedure: CYSTOSCOPY WITH RETROGRADE PYELOGRAM;  Surgeon: [name], MD;  Location: [facility];  Service: Urology;  Laterality: Bilateral;  . CYSTOSCOPY W/ RETROGRADES Left 08/27/2017   Procedure: BLUE LIGHT CYSTOSCOPY WITH CYSCVIEW WITH LEFT RETROGRADE [facility];  Surgeon: [name], MD;  Location[facility];  Service: Urology;  Laterality: Left;  . CYSTOSCOPY W/ URETERAL STENT PLACEMENT Bilateral 01/04/2014   Procedure: CYSTOSCOPY WITH bilateral RETROGRADE PYELOGRAM;  Surgeon: [name], MD;  Location: [facility];  Service: Urology;  Laterality: Bilateral;  . PARTIAL KNEE ARTHROPLASTY Right 06/11/2016   Procedure: RIGHT UNICOMPARTMENTAL KNEE ARTHROPLASTY;  Surgeon: Leandrew Koyanagi, MD;  Location: [place];  Service: Orthopedics;  Laterality: Right;  . TRANSURETHRAL RESECTION OF BLADDER TUMOR N/A 01/01/2016   Procedure: TRANSURETHRAL RESECTION OF BLADDER TUMOR (TURBT) WITH MITOMYCIN;  Surgeon: [name], MD;  Location: [facility];  Service: Urology;  Laterality: N/A;  . TRANSURETHRAL RESECTION OF BLADDER TUMOR N/A 08/27/2017   Procedure: TRANSURETHRAL RESECTION OF BLADDER TUMOR (TURBT);  Surgeon: [name], MD;  Location[facility];  Service: Urology;  Laterality: N/A;  . TRANSURETHRAL RESECTION OF BLADDER TUMOR WITH GYRUS (TURBT-GYRUS) N/A 01/04/2014   Procedure: TRANSURETHRAL RESECTION OF BLADDER TUMOR WITH GYRUS (TURBT-GYRUS);  Surgeon: [name],  MD;  Location: [place];  Service: Urology;  Laterality: N/A;  . TRANSURETHRAL RESECTION OF BLADDER TUMOR WITH GYRUS (TURBT-GYRUS) N/A 02/21/2014   Procedure: TRANSURETHRAL RESECTION OF BLADDER TUMOR WITH GYRUS (TURBT-GYRUS);  Surgeon: [name], MD;  Location: [facility];  Service: Urology;   Laterality: N/A;    reports that she has never smoked. She has never used smokeless tobacco. She reports that she drinks alcohol. She reports that she does not use drugs. family history includes Cancer in her mother; Hypertension in her father. Allergies  Allergen Reactions  . Codeine Nausea And Vomiting  . Penicillins Other (See Comments)    Unknown childhood reaction Has patient had a PCN reaction causing immediate rash, facial/tongue/throat swelling, SOB or lightheadedness with hypotension: Unknown Has patient had a PCN reaction causing severe rash involving mucus membranes or skin necrosis: Unknown Has patient had a PCN reaction that required hospitalization: Unknown Has patient had a PCN reaction occurring within the last 10 years: No If all of the above answers are "NO", then may proceed with Cephalosporin use.    Current Outpatient Medications on File Prior to Visit  Medication Sig Dispense Refill  . amLODipine (NORVASC) 5 MG tablet TAKE ONE TABLET BY MOUTH ONCE DAILY IN THE MORNING 90 tablet 3  . aspirin EC 81 MG tablet Take 81 mg by mouth 3 (three) times a week.    Marland Kitchen atorvastatin (LIPITOR) 10 MG tablet Take 1 tablet (10 mg total) by mouth daily. 90 tablet 3  . Cholecalciferol (VITAMIN D3) 2000 units TABS Take 2,000 Units by mouth daily.     . ferrous sulfate 325 (65 FE) MG tablet Take 1 tablet (325 mg total) by mouth daily with breakfast. 90 tablet 3  . irbesartan (AVAPRO) 300 MG tablet Take 1 tablet (300 mg total) by mouth daily. 90 tablet 3  . metoprolol tartrate (LOPRESSOR) 25 MG tablet Take 0.5 tablets (12.5 mg total) by mouth 2 (two) times daily. 90 tablet 3  . Naproxen Sodium (ALEVE) 220 MG CAPS Take 440 mg by mouth 2 (two) times daily as needed (for knee pain.).     Marland Kitchen Omega-3 Fatty Acids (FISH OIL) 1000 MG CAPS Take 1,000 mg by mouth daily.    . QUEtiapine (SEROQUEL) 50 MG tablet Take 1 tablet (50 mg total) by mouth at bedtime as needed. 90 tablet 1  . traMADol (ULTRAM) 50  MG tablet TAKE 1 TABLET BY MOUTH TWICE DAILY AS NEEDED FOR MODERATE PAIN.  POST-OPERATIVELY 60 tablet 3  . traMADol (ULTRAM) 50 MG tablet Take 1 tablet (50 mg total) by mouth every 6 (six) hours as needed for moderate pain. Post-operatively 15 tablet 0  . vitamin B-12 (CYANOCOBALAMIN) 1000 MCG tablet Take 1,000 mcg by mouth daily.     No current facility-administered medications on file prior to visit.    Review of Systems  Constitutional: Negative for other unusual diaphoresis or sweats HENT: Negative for ear discharge or swelling Eyes: Negative for other worsening visual disturbances Respiratory: Negative for stridor or other swelling  Gastrointestinal: Negative for worsening distension or other blood Genitourinary: Negative for retention or other urinary change Musculoskeletal: Negative for other MSK pain or swelling Skin: Negative for color change or other new lesions Neurological: Negative for worsening tremors and other numbness  Psychiatric/Behavioral: Negative for worsening agitation or other fatigue All other system neg per pt    Objective:   Physical Exam BP 128/80   Pulse (!) 59   Temp 98 F (  36.7 C) (Oral)   Ht 5\' 6"  (1.676 m)   Wt 171 lb (77.6 kg)   SpO2 97%   BMI 27.60 kg/m  VS noted, mild ill Constitutional: Pt appears in NAD HENT: Head: NCAT.  Right Ear: External ear normal.  Left Ear: External ear normal.  Bilat tm's with mod to severe erythema.  Max sinus areas non tender.  Pharynx with mild erythema, no exudate Eyes: . Pupils are equal, round, and reactive to light. Conjunctivae and EOM are normal Nose: without d/c or deformity Neck: Neck supple. Gross normal ROM Cardiovascular: Normal rate and regular rhythm.   Pulmonary/Chest: Effort normal and breath sounds without rales or wheezing.  Neurological: Pt is alert. At baseline orientation, motor grossly intact Skin: Skin is warm. No rashes, other new lesions, no LE edema Psychiatric: Pt behavior is normal  without agitation  No other exam findings    Assessment & Plan:

## 2017-12-06 NOTE — Patient Instructions (Signed)
Please take all new medication as prescribed - the antibiotic  You can also take Mucinex (or it's generic off brand) for congestion, and tylenol as needed for pain.  Please also consider taking Zyrtec and Nasacort for a few wks to clear the sinuses and help the ears drain  Please continue all other medications as before, and refills have been done if requested.  Please have the pharmacy call with any other refills you may need.  Please continue your efforts at being more active, low cholesterol diet, and weight control.  Please keep your appointments with your specialists as you may have planned  Please return in 6 months, or sooner if needed

## 2017-12-07 ENCOUNTER — Ambulatory Visit: Payer: Medicare Other | Admitting: Internal Medicine

## 2018-05-24 ENCOUNTER — Other Ambulatory Visit: Payer: Self-pay | Admitting: Internal Medicine

## 2018-07-13 ENCOUNTER — Other Ambulatory Visit: Payer: Self-pay | Admitting: Internal Medicine

## 2018-11-01 ENCOUNTER — Other Ambulatory Visit: Payer: Self-pay | Admitting: Internal Medicine

## 2019-01-31 ENCOUNTER — Other Ambulatory Visit: Payer: Self-pay | Admitting: Internal Medicine

## 2019-02-08 ENCOUNTER — Ambulatory Visit (INDEPENDENT_AMBULATORY_CARE_PROVIDER_SITE_OTHER): Payer: Medicare Other | Admitting: Internal Medicine

## 2019-02-08 DIAGNOSIS — E119 Type 2 diabetes mellitus without complications: Secondary | ICD-10-CM | POA: Diagnosis not present

## 2019-02-08 MED ORDER — QUETIAPINE FUMARATE 50 MG PO TABS: 50.0000 mg | ORAL_TABLET | Freq: Every evening | ORAL | 1 refills | Status: DC | PRN

## 2019-02-08 MED ORDER — ATORVASTATIN CALCIUM 10 MG PO TABS: 10.0000 mg | ORAL_TABLET | Freq: Every day | ORAL | 3 refills | Status: DC

## 2019-02-08 MED ORDER — METOPROLOL TARTRATE 25 MG PO TABS: ORAL_TABLET | ORAL | 3 refills | Status: DC

## 2019-02-08 MED ORDER — AMLODIPINE BESYLATE 5 MG PO TABS: ORAL_TABLET | ORAL | 3 refills | Status: DC

## 2019-02-08 MED ORDER — IRBESARTAN 300 MG PO TABS: 300.0000 mg | ORAL_TABLET | Freq: Every day | ORAL | 3 refills | Status: DC

## 2019-02-08 NOTE — Progress Notes (Signed)
Patient ID: Rhonda Norman, female   DOB: 12-Feb-1933, 85 y.o.   MRN: [id number]   Phone visit  Cumulative time during 7-day interval 15 min, there was not an associated office visit for this concern within a 7 day period.  Verbal consent for services obtained from patient prior to services given.  Names of all persons present for services: Cathlean Cower, MD, patient  Chief complaint: DM and HTN  History, background, results pertinent:  Here to f/u; overall doing ok,  Pt denies chest pain, increasing sob or doe, wheezing, orthopnea, PND, increased LE swelling, palpitations, dizziness or syncope.  Pt denies new neurological symptoms such as new headache, or facial or extremity weakness or numbness.  Pt denies polydipsia, polyuria, or low sugar episode.  Pt states overall good compliance with meds, mostly trying to follow appropriate diet, with wt overall stable,  And declines labs due to fear of pandemic risk.  No new complaints  BP at home < 140/90  Past Medical History:  Diagnosis Date  . Allergic rhinitis 12/16/2016  . Anxiety   . Arthritis    DJD-right knee  . Atrial tachycardia, paroxysmal ([place])   . Bladder cancer [facility]) urologist- dr Tammi Klippel   now recurrent  w/ hx high grade superficial carcinoma in 2015 s/p TURBT and chemo instillation  . CAD (coronary artery disease)    non obstructive, [name], april 2010  . Depression   . Full dentures   . GERD (gastroesophageal reflux disease)   . History of iron deficiency anemia   . Hyperlipidemia   . Hypertension   . Pre-diabetes    March 2017  A1C was 6.3.  She takes no meds   No results found for this or any previous visit (from the past 48 hour(s)).  Lab Results  Component Value Date   WBC 6.0 08/20/2017   HGB 12.9 08/20/2017   HCT 39.9 08/20/2017   PLT 215 08/20/2017   GLUCOSE 113 (H) 08/20/2017   CHOL 193 07/11/2015   TRIG 132.0 07/11/2015   HDL 62.20 07/11/2015   LDLDIRECT 94.1 06/01/2012   LDLCALC 105 (H) 07/11/2015    ALT 21 06/04/2016   AST 27 06/04/2016   NA 141 08/20/2017   K 4.8 08/20/2017   CL 108 08/20/2017   CREATININE 0.99 08/20/2017   BUN 23 (H) 08/20/2017   CO2 23 08/20/2017   TSH 2.40 07/11/2015   INR 1.00 06/04/2016   HGBA1C 5.9 (H) 08/20/2017   MICROALBUR 13.3 (H) 06/29/2013   Declines labs due to risk with pandemic  A/P/next steps:   DM - has been diet controlled, delcines labs, for f/u lab next visit  HTN - stable overall by history and exam, recent data reviewed with pt, and pt to continue medical treatment as before,  to f/u any worsening symptoms or concerns  HLD - declines lab f/u, cont current tx, lower chol diet  [name]

## 2019-02-11 ENCOUNTER — Encounter: Payer: Self-pay | Admitting: Internal Medicine

## 2019-02-11 NOTE — Patient Instructions (Addendum)
Please continue all other medications as before, and refills have been done if requested.  Please have the pharmacy call with any other refills you may need.  Please continue your efforts at being more active, low cholesterol diet, and weight control.  Please keep your appointments with your specialists as you may have planned  Please return in 6 months, or sooner if needed 

## 2019-02-11 NOTE — Assessment & Plan Note (Signed)
stable overall by history and exam, recent data reviewed with pt, and pt to continue medical treatment as before,  to f/u any worsening symptoms or concerns  

## 2019-05-15 DIAGNOSIS — N281 Cyst of kidney, acquired: Secondary | ICD-10-CM | POA: Diagnosis not present

## 2019-05-15 DIAGNOSIS — C672 Malignant neoplasm of lateral wall of bladder: Secondary | ICD-10-CM | POA: Diagnosis not present

## 2019-06-16 DIAGNOSIS — Z85828 Personal history of other malignant neoplasm of skin: Secondary | ICD-10-CM | POA: Diagnosis not present

## 2019-06-16 DIAGNOSIS — D1801 Hemangioma of skin and subcutaneous tissue: Secondary | ICD-10-CM | POA: Diagnosis not present

## 2019-06-16 DIAGNOSIS — L821 Other seborrheic keratosis: Secondary | ICD-10-CM | POA: Diagnosis not present

## 2019-06-16 DIAGNOSIS — D2262 Melanocytic nevi of left upper limb, including shoulder: Secondary | ICD-10-CM | POA: Diagnosis not present

## 2019-06-16 DIAGNOSIS — L570 Actinic keratosis: Secondary | ICD-10-CM | POA: Diagnosis not present

## 2019-06-16 DIAGNOSIS — Z85820 Personal history of malignant melanoma of skin: Secondary | ICD-10-CM | POA: Diagnosis not present

## 2019-06-16 DIAGNOSIS — L720 Epidermal cyst: Secondary | ICD-10-CM | POA: Diagnosis not present

## 2019-06-16 DIAGNOSIS — D485 Neoplasm of uncertain behavior of skin: Secondary | ICD-10-CM | POA: Diagnosis not present

## 2019-08-16 ENCOUNTER — Other Ambulatory Visit: Payer: Self-pay | Admitting: Internal Medicine

## 2019-08-16 NOTE — Telephone Encounter (Signed)
Done erx 

## 2019-09-21 DIAGNOSIS — R310 Gross hematuria: Secondary | ICD-10-CM | POA: Diagnosis not present

## 2019-09-21 DIAGNOSIS — C672 Malignant neoplasm of lateral wall of bladder: Secondary | ICD-10-CM | POA: Diagnosis not present

## 2019-09-21 DIAGNOSIS — R8279 Other abnormal findings on microbiological examination of urine: Secondary | ICD-10-CM | POA: Diagnosis not present

## 2019-10-06 DIAGNOSIS — R310 Gross hematuria: Secondary | ICD-10-CM | POA: Diagnosis not present

## 2019-10-10 DIAGNOSIS — C672 Malignant neoplasm of lateral wall of bladder: Secondary | ICD-10-CM | POA: Diagnosis not present

## 2019-10-12 ENCOUNTER — Encounter: Payer: Self-pay | Admitting: Internal Medicine

## 2019-10-12 ENCOUNTER — Telehealth (INDEPENDENT_AMBULATORY_CARE_PROVIDER_SITE_OTHER): Payer: Medicare Other | Admitting: Internal Medicine

## 2019-10-12 DIAGNOSIS — H6593 Unspecified nonsuppurative otitis media, bilateral: Secondary | ICD-10-CM | POA: Diagnosis not present

## 2019-10-12 MED ORDER — AZITHROMYCIN 250 MG PO TABS: ORAL_TABLET | ORAL | 0 refills | Status: DC

## 2019-10-12 NOTE — Assessment & Plan Note (Signed)
Likely diagnosed based on symptoms and history. Rx azithromycin and start zyrtec or similar otc. Needs in person visit if no improvement.

## 2019-10-12 NOTE — Progress Notes (Signed)
Virtual Visit via Audio Note  I connected with Rhonda Norman on 10/12/19 at  9:40 AM EDT by an audio enabled telemedicine application and verified that I am speaking with the correct person using two identifiers.  The patient and the provider were at separate locations throughout the entire encounter. Patient location: home, Provider location: work   I discussed the limitations of evaluation and management by telemedicine and the availability of in person appointments. The patient expressed understanding and agreed to proceed. The patient and the provider were the only parties present for the visit unless noted in HPI below.  History of Present Illness: The patient is a 86 y.o. female with visit for allergies/ear pain and fluid. Started with getting allergies about 2 years ago and had prior ear infection about 1.5 years ago. In the last 2-3 weeks she has had more allergy kind of symptoms. She does not take anything for these. In the last 2 weeks she has gotten a feeling of fluid behind her ears. In the last 1 week she has gotten dizziness and feeling off balance. This has worsened. She is very concerned about falling. Has no fevers or chills. Denies SOB or cough. Has been vaccinated against covid-19. Overall it is worsening  Observations/Objective: Voice strong, no coughing or dyspnea during visit, A and O times 3  Assessment and Plan: See problem oriented charting  Follow Up Instructions: rx azithromycin for likely ear infection, start otc antihistamine as well  Visit time 13 minutes in non-face to face communication with patient and coordination of care.   I discussed the assessment and treatment plan with the patient. The patient was provided an opportunity to ask questions and all were answered. The patient agreed with the plan and demonstrated an understanding of the instructions.   The patient was advised to call back or seek an in-person evaluation if the symptoms worsen or if the  condition fails to improve as anticipated.  Hoyt Koch, MD

## 2020-02-26 ENCOUNTER — Other Ambulatory Visit: Payer: Self-pay | Admitting: Internal Medicine

## 2020-02-26 NOTE — Telephone Encounter (Signed)
Refills done 1 mo only  Please contact pt - due for ROV

## 2020-03-18 ENCOUNTER — Ambulatory Visit: Payer: Medicare Other | Admitting: Internal Medicine

## 2020-03-19 ENCOUNTER — Ambulatory Visit (INDEPENDENT_AMBULATORY_CARE_PROVIDER_SITE_OTHER): Payer: Medicare Other

## 2020-03-19 DIAGNOSIS — Z0000 Encounter for general adult medical examination without abnormal findings: Secondary | ICD-10-CM | POA: Diagnosis not present

## 2020-03-19 NOTE — Patient Instructions (Signed)
Rhonda Norman , Thank you for taking time to come for your Medicare Wellness Visit. I appreciate your ongoing commitment to your health goals. Please review the following plan we discussed and let me know if I can assist you in the future.   Screening recommendations/referrals: Colonoscopy: no repeat due to age 86: no repeat due to age Bone Density: never done Recommended yearly ophthalmology/optometry visit for glaucoma screening and checkup Recommended yearly dental visit for hygiene and checkup  Vaccinations: Influenza vaccine: declined Pneumococcal vaccine: need Pneumovax 23 Tdap vaccine: declined Shingles vaccine: declined   Covid-19: up to date  Advanced directives: Please bring a copy of your health care power of attorney and living will to the office at your convenience.  Conditions/risks identified: Yes. Reviewed health maintenance screenings with patient today and relevant education, vaccines, and/or referrals were provided. Continue doing brain stimulating activities (puzzles, reading, adult coloring books, staying active) to keep memory sharp. Continue to eat heart healthy diet (full of fruits, vegetables, whole grains, lean protein, water--limit salt, fat, and sugar intake) and increase physical activity as tolerated.  Next appointment: Schedule in 1 year.   Preventive Care 57 Years and Older, Female Preventive care refers to lifestyle choices and visits with your health care provider that can promote health and wellness. What does preventive care include?  A yearly physical exam. This is also called an annual well check.  Dental exams once or twice a year.  Routine eye exams. Ask your health care provider how often you should have your eyes checked.  Personal lifestyle choices, including:  Daily care of your teeth and gums.  Regular physical activity.  Eating a healthy diet.  Avoiding tobacco and drug use.  Limiting alcohol use.  Practicing safe  sex.  Taking low-dose aspirin every day.  Taking vitamin and mineral supplements as recommended by your health care provider. What happens during an annual well check? The services and screenings done by your health care provider during your annual well check will depend on your age, overall health, lifestyle risk factors, and family history of disease. Counseling  Your health care provider may ask you questions about your:  Alcohol use.  Tobacco use.  Drug use.  Emotional well-being.  Home and relationship well-being.  Sexual activity.  Eating habits.  History of falls.  Memory and ability to understand (cognition).  Work and work Statistician.  Reproductive health. Screening  You may have the following tests or measurements:  Height, weight, and BMI.  Blood pressure.  Lipid and cholesterol levels. These may be checked every 5 years, or more frequently if you are over 65 years old.  Skin check.  Lung cancer screening. You may have this screening every year starting at age 68 if you have a 30-pack-year history of smoking and currently smoke or have quit within the past 15 years.  Fecal occult blood test (FOBT) of the stool. You may have this test every year starting at age 10.  Flexible sigmoidoscopy or colonoscopy. You may have a sigmoidoscopy every 5 years or a colonoscopy every 10 years starting at age 72.  Hepatitis C blood test.  Hepatitis B blood test.  Sexually transmitted disease (STD) testing.  Diabetes screening. This is done by checking your blood sugar (glucose) after you have not eaten for a while (fasting). You may have this done every 1-3 years.  Bone density scan. This is done to screen for osteoporosis. You may have this done starting at age 23.  Mammogram. This may  be done every 1-2 years. Talk to your health care provider about how often you should have regular mammograms. Talk with your health care provider about your test results,  treatment options, and if necessary, the need for more tests. Vaccines  Your health care provider may recommend certain vaccines, such as:  Influenza vaccine. This is recommended every year.  Tetanus, diphtheria, and acellular pertussis (Tdap, Td) vaccine. You may need a Td booster every 10 years.  Zoster vaccine. You may need this after age 26.  Pneumococcal 13-valent conjugate (PCV13) vaccine. One dose is recommended after age 17.  Pneumococcal polysaccharide (PPSV23) vaccine. One dose is recommended after age 46. Talk to your health care provider about which screenings and vaccines you need and how often you need them. This information is not intended to replace advice given to you by your health care provider. Make sure you discuss any questions you have with your health care provider. Document Released: 05/10/2015 Document Revised: 01/01/2016 Document Reviewed: 02/12/2015 Elsevier Interactive Patient Education  2017 [place] Prevention in the Home Falls can cause injuries. They can happen to people of all ages. There are many things you can do to make your home safe and to help prevent falls. What can I do on the outside of my home?  Regularly fix the edges of walkways and driveways and fix any cracks.  Remove anything that might make you trip as you walk through a door, such as a raised step or threshold.  Trim any bushes or trees on the path to your home.  Use bright outdoor lighting.  Clear any walking paths of anything that might make someone trip, such as rocks or tools.  Regularly check to see if handrails are loose or broken. Make sure that both sides of any steps have handrails.  Any raised decks and porches should have guardrails on the edges.  Have any leaves, snow, or ice cleared regularly.  Use sand or salt on walking paths during winter.  Clean up any spills in your garage right away. This includes oil or grease spills. What can I do in the  bathroom?  Use night lights.  Install grab bars by the toilet and in the tub and shower. Do not use towel bars as grab bars.  Use non-skid mats or decals in the tub or shower.  If you need to sit down in the shower, use a plastic, non-slip stool.  Keep the floor dry. Clean up any water that spills on the floor as soon as it happens.  Remove soap buildup in the tub or shower regularly.  Attach bath mats securely with double-sided non-slip rug tape.  Do not have throw rugs and other things on the floor that can make you trip. What can I do in the bedroom?  Use night lights.  Make sure that you have a light by your bed that is easy to reach.  Do not use any sheets or blankets that are too big for your bed. They should not hang down onto the floor.  Have a firm chair that has side arms. You can use this for support while you get dressed.  Do not have throw rugs and other things on the floor that can make you trip. What can I do in the kitchen?  Clean up any spills right away.  Avoid walking on wet floors.  Keep items that you use a lot in easy-to-reach places.  If you need to reach something above you,  use a strong step stool that has a grab bar.  Keep electrical cords out of the way.  Do not use floor polish or wax that makes floors slippery. If you must use wax, use non-skid floor wax.  Do not have throw rugs and other things on the floor that can make you trip. What can I do with my stairs?  Do not leave any items on the stairs.  Make sure that there are handrails on both sides of the stairs and use them. Fix handrails that are broken or loose. Make sure that handrails are as long as the stairways.  Check any carpeting to make sure that it is firmly attached to the stairs. Fix any carpet that is loose or worn.  Avoid having throw rugs at the top or bottom of the stairs. If you do have throw rugs, attach them to the floor with carpet tape.  Make sure that you have a  light switch at the top of the stairs and the bottom of the stairs. If you do not have them, ask someone to add them for you. What else can I do to help prevent falls?  Wear shoes that:  Do not have high heels.  Have rubber bottoms.  Are comfortable and fit you well.  Are closed at the toe. Do not wear sandals.  If you use a stepladder:  Make sure that it is fully opened. Do not climb a closed stepladder.  Make sure that both sides of the stepladder are locked into place.  Ask someone to hold it for you, if possible.  Clearly mark and make sure that you can see:  Any grab bars or handrails.  First and last steps.  Where the edge of each step is.  Use tools that help you move around (mobility aids) if they are needed. These include:  Canes.  Walkers.  Scooters.  Crutches.  Turn on the lights when you go into a dark area. Replace any light bulbs as soon as they burn out.  Set up your furniture so you have a clear path. Avoid moving your furniture around.  If any of your floors are uneven, fix them.  If there are any pets around you, be aware of where they are.  Review your medicines with your doctor. Some medicines can make you feel dizzy. This can increase your chance of falling. Ask your doctor what other things that you can do to help prevent falls. This information is not intended to replace advice given to you by your health care provider. Make sure you discuss any questions you have with your health care provider. Document Released: 02/07/2009 Document Revised: 09/19/2015 Document Reviewed: 05/18/2014 Elsevier Interactive Patient Education  2017 [employer].

## 2020-03-19 NOTE — Progress Notes (Signed)
I connected with Marty Heck today by telephone and verified that I am speaking with the correct person using two identifiers. Location patient: home Location provider: work Persons participating in the virtual visit: Amil Moseman and [employer], LPN.   I discussed the limitations, risks, security and privacy concerns of performing an evaluation and management service by telephone and the availability of in person appointments. I also discussed with the patient that there may be a patient responsible charge related to this service. The patient expressed understanding and verbally consented to this telephonic visit.    Interactive audio and video telecommunications were attempted between this provider and patient, however failed, due to patient having technical difficulties OR patient did not have access to video capability.  We continued and completed visit with audio only.  Some vital signs may be absent or patient reported.   Time Spent with patient on telephone encounter: 30 minutes  Subjective:   Rhonda Norman is a 86 y.o. female who presents for Medicare Annual (Subsequent) preventive examination. No ROS. Medicare Wellness Visit. Additional risk factors are reflected in social history. Review of Systems     Cardiac Risk Factors include: advanced age (>24men, >12 women);dyslipidemia;family history of premature cardiovascular disease;hypertension     Objective:    There were no vitals filed for this visit. There is no height or weight on file to calculate BMI.  Advanced Directives 03/19/2020 08/20/2017 06/04/2016 01/01/2016 02/21/2014 01/04/2014 12/28/2013  Does Patient Have a Medical Advance Directive? Yes Yes Yes Yes Yes Yes Yes  Type of Advance Directive Living will;Healthcare Power of [place];Living will - Living will;Healthcare Power of Attorney Living will [place];Living will [place];Living will  Does patient want to make changes to medical advance directive? No - Patient declined No - Patient declined - - - No - Patient declined No - Patient declined  Copy of [place] in Chart? No - copy requested No - copy requested - - - - No - copy requested    Current Medications (verified) Outpatient Encounter Medications as of 03/19/2020  Medication Sig  . amLODipine (NORVASC) 5 MG tablet Take 1 tablet by mouth once daily  . atorvastatin (LIPITOR) 10 MG tablet Take 1 tablet by mouth once daily  . Cholecalciferol (VITAMIN D3) 2000 units TABS Take 2,000 Units by mouth daily.   . ferrous sulfate 325 (65 FE) MG tablet Take 1 tablet (325 mg total) by mouth daily with breakfast.  . irbesartan (AVAPRO) 300 MG tablet Take 1 tablet by mouth once daily  . metoprolol tartrate (LOPRESSOR) 25 MG tablet Take 1/2 (one-half) tablet by mouth twice daily  . QUEtiapine (SEROQUEL) 50 MG tablet TAKE 1 TABLET BY MOUTH AT BEDTIME AS NEEDED (Patient taking differently: Take 25 mg by mouth. )  . vitamin B-12 (CYANOCOBALAMIN) 1000 MCG tablet Take 1,000 mcg by mouth daily.  Marland Kitchen aspirin EC 81 MG tablet Take 81 mg by mouth 3 (three) times a week. (Patient not taking: Reported on 03/19/2020)  . azithromycin (ZITHROMAX) 250 MG tablet Day 1 take 2 pills, days 2-5 take 1 pill daily. (Patient not taking: Reported on 03/19/2020)  . Omega-3 Fatty Acids (FISH OIL) 1000 MG CAPS Take 1,000 mg by mouth daily. (Patient not taking: Reported on 03/19/2020)   No facility-administered encounter medications on file as of 03/19/2020.    Allergies (verified) Codeine and Penicillins   History: Past Medical History:  Diagnosis Date  . Allergic  rhinitis 12/16/2016  . Anxiety   . Arthritis    DJD-right knee  . Atrial tachycardia, paroxysmal ([place])   . Bladder cancer [facility]) urologist- dr Tammi Klippel   now recurrent  w/ hx high grade superficial carcinoma in 2015 s/p TURBT and chemo instillation  . CAD  (coronary artery disease)    non obstructive, [name], april 2010  . Depression   . Full dentures   . GERD (gastroesophageal reflux disease)   . History of iron deficiency anemia   . Hyperlipidemia   . Hypertension   . Pre-diabetes    March 2017  A1C was 6.3.  She takes no meds   Past Surgical History:  Procedure Laterality Date  . ABDOMINAL HYSTERECTOMY  1970's   w/ bilateral salpingoophorectomy  . CARDIAC CATHETERIZATION  08-22-2008  dr Cathie Olden   mild to moderate coronary artery irregularies/  dRCA 30%/  ef 60%/  possible coronary spasm  . CARDIOVASCULAR STRESS TEST  08-15-2008  dr Cathie Olden   abnormal stress dual isotope/  BP drop following exercise is concern/ no evidence ischenmia/ LV function normal/ ef 74%  . CATARACT EXTRACTION W/ INTRAOCULAR LENS  IMPLANT, BILATERAL  2013  . CYSTOSCOPY W/ RETROGRADES Bilateral 02/21/2014   Procedure: CYSTOSCOPY WITH RETROGRADE PYELOGRAM, Bilateral ;  Surgeon: [name], MD;  Location: [facility];  Service: Urology;  Laterality: Bilateral;  . CYSTOSCOPY W/ RETROGRADES Bilateral 01/01/2016   Procedure: CYSTOSCOPY WITH RETROGRADE PYELOGRAM;  Surgeon: [name], MD;  Location: [facility];  Service: Urology;  Laterality: Bilateral;  . CYSTOSCOPY W/ RETROGRADES Left 08/27/2017   Procedure: BLUE LIGHT CYSTOSCOPY WITH CYSCVIEW WITH LEFT RETROGRADE OINOMVEH;  Surgeon: [name], MD;  Location: [facility];  Service: Urology;  Laterality: Left;  . CYSTOSCOPY W/ URETERAL STENT PLACEMENT Bilateral 01/04/2014   Procedure: CYSTOSCOPY WITH bilateral RETROGRADE PYELOGRAM;  Surgeon: [name], MD;  Location: [facility];  Service: Urology;  Laterality: Bilateral;  . PARTIAL KNEE ARTHROPLASTY Right 06/11/2016   Procedure: RIGHT UNICOMPARTMENTAL KNEE ARTHROPLASTY;  Surgeon: Leandrew Koyanagi, MD;  Location: [place];  Service: Orthopedics;  Laterality: Right;  . TRANSURETHRAL RESECTION OF BLADDER TUMOR N/A 01/01/2016     Procedure: TRANSURETHRAL RESECTION OF BLADDER TUMOR (TURBT) WITH MITOMYCIN;  Surgeon: [name], MD;  Location[facility];  Service: Urology;  Laterality: N/A;  . TRANSURETHRAL RESECTION OF BLADDER TUMOR N/A 08/27/2017   Procedure: TRANSURETHRAL RESECTION OF BLADDER TUMOR (TURBT);  Surgeon: [name], MD;  Location: [facility];  Service: Urology;  Laterality: N/A;  . TRANSURETHRAL RESECTION OF BLADDER TUMOR WITH GYRUS (TURBT-GYRUS) N/A 01/04/2014   Procedure: TRANSURETHRAL RESECTION OF BLADDER TUMOR WITH GYRUS (TURBT-GYRUS);  Surgeon: [name], MD;  Location: [facility];  Service: Urology;  Laterality: N/A;  . TRANSURETHRAL RESECTION OF BLADDER TUMOR WITH GYRUS (TURBT-GYRUS) N/A 02/21/2014   Procedure: TRANSURETHRAL RESECTION OF BLADDER TUMOR WITH GYRUS (TURBT-GYRUS);  Surgeon: [name], MD;  Location: [facility];  Service: Urology;  Laterality: N/A;   Family History  Problem Relation Age of Onset  . Cancer Mother   . Hypertension Father    Social History   Socioeconomic History  . Marital status: Widowed    Spouse name: Not on file  . Number of children: Not on file  . Years of education: Not on file  . Highest education level: Not on file  Occupational History  . Not on file  Tobacco Use  . Smoking status: Never Smoker  .  Smokeless tobacco: Never Used  Vaping Use  . Vaping Use: Never used  Substance and Sexual Activity  . Alcohol use: Yes    Comment: social  . Drug use: No  . Sexual activity: Not on file  Other Topics Concern  . Not on file  Social History Narrative  . Not on file   Social Determinants of Health   Financial Resource Strain: Low Risk   . Difficulty of Paying Living Expenses: Not hard at all  Food Insecurity: No Food Insecurity  . Worried About Charity fundraiser in the Last Year: Never true  . Ran Out of Food in the Last Year: Never true  Transportation Needs: No Transportation Needs   . Lack of Transportation (Medical): No  . Lack of Transportation (Non-Medical): No  Physical Activity: Sufficiently Active  . Days of Exercise per Week: 5 days  . Minutes of Exercise per Session: 30 min  Stress: No Stress Concern Present  . Feeling of Stress : Not at all  Social Connections: Socially Isolated  . Frequency of Communication with Friends and Family: More than three times a week  . Frequency of Social Gatherings with Friends and Family: Never  . Attends Religious Services: Never  . Active Member of Clubs or Organizations: No  . Attends Archivist Meetings: Never  . Marital Status: Widowed    Tobacco Counseling Counseling given: Not Answered   Clinical Intake:  Pre-visit preparation completed: Yes  Pain : No/denies pain     Nutritional Risks: None Diabetes: No  How often do you need to have someone help you when you read instructions, pamphlets, or other written materials from your doctor or pharmacy?: 1 - Never What is the last grade level you completed in school?: 10th grade  Diabetic? no  Interpreter Needed?: No  Information entered by :: Lisette Abu, LPN   Activities of Daily Living In your present state of health, do you have any difficulty performing the following activities: 03/19/2020  Hearing? N  Vision? N  Difficulty concentrating or making decisions? N  Walking or climbing stairs? N  Dressing or bathing? N  Doing errands, shopping? N  Preparing Food and eating ? N  Using the Toilet? N  In the past six months, have you accidently leaked urine? N  Do you have problems with loss of bowel control? N  Managing your Medications? N  Managing your Finances? N  Housekeeping or managing your Housekeeping? N  Some recent data might be hidden    Patient Care Team: Biagio Borg, MD as PCP - General  Indicate any recent Medical Services you may have received from other than [facility] providers in the past year (date may be  approximate).     Assessment:   This is a routine wellness examination for Nolene.  Hearing/Vision screen No exam data present  Dietary issues and exercise activities discussed: Current Exercise Habits: Home exercise routine, Type of exercise: walking;Other - see comments (yard work, house work , walks 1 mile everyday), Time (Minutes): 30, Frequency (Times/Week): 5, Weekly Exercise (Minutes/Week): 150, Intensity: Moderate, Exercise limited by: orthopedic condition(s);cardiac condition(s)  Goals   None    Depression Screen PHQ 2/9 Scores 03/19/2020 08/18/2017 07/31/2016 07/11/2015 07/11/2015 06/06/2014 06/29/2013  PHQ - 2 Score 1 1 0 0 0 0 1  PHQ- 9 Score - 1 - - - - -    Fall Risk Fall Risk  03/19/2020 08/18/2017 07/31/2016 07/11/2015 07/11/2015  Falls in the past year? 0  No No No No  Number falls in past yr: 0 - - - -  Injury with Fall? 0 - - - -  Risk for fall due to : No Fall Risks - - - -  Follow up Falls evaluation completed - - - -    Any stairs in or around the home? No  If so, are there any without handrails? No  Home free of loose throw rugs in walkways, pet beds, electrical cords, etc? Yes  Adequate lighting in your home to reduce risk of falls? Yes   ASSISTIVE DEVICES UTILIZED TO PREVENT FALLS:  Life alert? No  Use of a cane, walker or w/c? No  Grab bars in the bathroom? No  Shower chair or bench in shower? Yes  Elevated toilet seat or a handicapped toilet? Yes   TIMED UP AND GO:  Was the test performed? No .  Length of time to ambulate 10 feet: 0 sec.   Gait steady and fast without use of assistive device  Cognitive Function:   Patient is cogitatively intact.      Immunizations Immunization History  Administered Date(s) Administered  . PFIZER SARS-COV-2 Vaccination 05/26/2019, 06/16/2019  . Pneumococcal Conjugate-13 06/10/2011, 06/29/2013  . Td 05/28/2005    TDAP status: Due, Education has been provided regarding the importance of this vaccine. Advised  may receive this vaccine at local pharmacy or Health Dept. Aware to provide a copy of the vaccination record if obtained from local pharmacy or Health Dept. Verbalized acceptance and understanding. Flu Vaccine status: Declined, Education has been provided regarding the importance of this vaccine but patient still declined. Advised may receive this vaccine at local pharmacy or Health Dept. Aware to provide a copy of the vaccination record if obtained from local pharmacy or Health Dept. Verbalized acceptance and understanding. Pneumococcal vaccine status: Declined,  Education has been provided regarding the importance of this vaccine but patient still declined. Advised may receive this vaccine at local pharmacy or Health Dept. Aware to provide a copy of the vaccination record if obtained from local pharmacy or Health Dept. Verbalized acceptance and understanding.  Covid-19 vaccine status: Completed vaccines  Qualifies for Shingles Vaccine? Yes   Zostavax completed No   Shingrix Completed?: No.    Education has been provided regarding the importance of this vaccine. Patient has been advised to call insurance company to determine out of pocket expense if they have not yet received this vaccine. Advised may also receive vaccine at local pharmacy or Health Dept. Verbalized acceptance and understanding.  Screening Tests Health Maintenance  Topic Date Due  . PNA vac Low Risk Adult (2 of 2 - PPSV23) 06/30/2014  . TETANUS/TDAP  05/29/2015  . OPHTHALMOLOGY EXAM  07/26/2016  . FOOT EXAM  08/19/2018  . INFLUENZA VACCINE  Never done  . COVID-19 Vaccine  Completed  . DEXA SCAN  Discontinued    Health Maintenance  Health Maintenance Due  Topic Date Due  . PNA vac Low Risk Adult (2 of 2 - PPSV23) 06/30/2014  . TETANUS/TDAP  05/29/2015  . OPHTHALMOLOGY EXAM  07/26/2016  . FOOT EXAM  08/19/2018  . INFLUENZA VACCINE  Never done    Colorectal cancer screening: No longer required.  Mammogram status: No  longer required.   Lung Cancer Screening: (Low Dose CT Chest recommended if Age 53-80 years, 30 pack-year currently smoking OR have quit w/in 15years.) does not qualify.   Lung Cancer Screening Referral: no  Additional Screening:  Hepatitis C Screening: does not  qualify; Completed no  Vision Screening: Recommended annual ophthalmology exams for early detection of glaucoma and other disorders of the eye. Is the patient up to date with their annual eye exam?  Yes  Who is the provider or what is the name of the office in which the patient attends annual eye exams? [facility] If pt is not established with a provider, would they like to be referred to a provider to establish care? No .   Dental Screening: Recommended annual dental exams for proper oral hygiene  Community Resource Referral / Chronic Care Management: CRR required this visit?  No   CCM required this visit?  No      Plan:     I have personally reviewed and noted the following in the patient's chart:   . Medical and social history . Use of alcohol, tobacco or illicit drugs  . Current medications and supplements . Functional ability and status . Nutritional status . Physical activity . Advanced directives . List of other physicians . Hospitalizations, surgeries, and ER visits in previous 12 months . Vitals . Screenings to include cognitive, depression, and falls . Referrals and appointments  In addition, I have reviewed and discussed with patient certain preventive protocols, quality metrics, and best practice recommendations. A written personalized care plan for preventive services as well as general preventive health recommendations were provided to patient.     Sheral Flow, LPN   45/06/[id number]   Nurse Notes:  Patient is cogitatively intact. There were no vitals filed for this visit. There is no height or weight on file to calculate BMI. Patient stated that she has no issues with gait or  balance; does not use any assistive devices.

## 2020-03-28 ENCOUNTER — Telehealth (INDEPENDENT_AMBULATORY_CARE_PROVIDER_SITE_OTHER): Payer: Medicare Other | Admitting: Internal Medicine

## 2020-03-28 DIAGNOSIS — I10 Essential (primary) hypertension: Secondary | ICD-10-CM | POA: Diagnosis not present

## 2020-03-28 DIAGNOSIS — Z0000 Encounter for general adult medical examination without abnormal findings: Secondary | ICD-10-CM

## 2020-03-28 DIAGNOSIS — E1165 Type 2 diabetes mellitus with hyperglycemia: Secondary | ICD-10-CM

## 2020-03-28 DIAGNOSIS — E559 Vitamin D deficiency, unspecified: Secondary | ICD-10-CM

## 2020-03-28 DIAGNOSIS — E7849 Other hyperlipidemia: Secondary | ICD-10-CM

## 2020-03-28 DIAGNOSIS — E538 Deficiency of other specified B group vitamins: Secondary | ICD-10-CM

## 2020-03-28 MED ORDER — IRBESARTAN 300 MG PO TABS: 300.0000 mg | ORAL_TABLET | Freq: Every day | ORAL | 3 refills | Status: AC

## 2020-03-28 MED ORDER — QUETIAPINE FUMARATE 50 MG PO TABS: 50.0000 mg | ORAL_TABLET | Freq: Every evening | ORAL | 3 refills | Status: AC | PRN

## 2020-03-28 MED ORDER — AMLODIPINE BESYLATE 5 MG PO TABS: ORAL_TABLET | ORAL | 3 refills | Status: AC

## 2020-03-28 MED ORDER — ATORVASTATIN CALCIUM 10 MG PO TABS: 10.0000 mg | ORAL_TABLET | Freq: Every day | ORAL | 3 refills | Status: DC

## 2020-03-28 MED ORDER — METOPROLOL TARTRATE 25 MG PO TABS: ORAL_TABLET | ORAL | 3 refills | Status: AC

## 2020-03-28 NOTE — Progress Notes (Signed)
Patient ID: Rhonda Norman, female   DOB: 26-Aug-1932, 86 y.o.   MRN: [id number]  Cumulative time during 7-day interval 31 min, there was not an associated office visit for this concern within a 7 day period.  Verbal consent for services obtained from patient prior to services given.  Names of all persons present for services: Cathlean Cower, MD, patient  Chief complaint: physcial  History, background, results pertinent:  Here for wellness and f/u;  Overall doing ok;  Pt denies Chest pain, worsening SOB, DOE, wheezing, orthopnea, PND, worsening LE edema, palpitations, dizziness or syncope.  Pt denies neurological change such as new headache, facial or extremity weakness.  Pt denies polydipsia, polyuria, or low sugar symptoms. Pt states overall good compliance with treatment and medications, good tolerability, and has been trying to follow appropriate diet.  Pt denies worsening depressive symptoms, suicidal ideation or panic. No fever, night sweats, wt loss, loss of appetite, or other constitutional symptoms.  Pt states good ability with ADL's, has low fall risk, home safety reviewed and adequate, no other significant changes in hearing or vision, and only occasionally active with exercise.  Past Medical History:  Diagnosis Date  . Allergic rhinitis 12/16/2016  . Anxiety   . Arthritis    DJD-right knee  . Atrial tachycardia, paroxysmal ([place])   . Bladder cancer [facility]) urologist- dr Tammi Klippel   now recurrent  w/ hx high grade superficial carcinoma in 2015 s/p TURBT and chemo instillation  . CAD (coronary artery disease)    non obstructive, [name], april 2010  . Depression   . Full dentures   . GERD (gastroesophageal reflux disease)   . History of iron deficiency anemia   . Hyperlipidemia   . Hypertension   . Pre-diabetes    March 2017  A1C was 6.3.  She takes no meds   No results found for this or any previous visit (from the past 48 hour(s)). Current Outpatient Medications on File Prior  to Visit  Medication Sig Dispense Refill  . aspirin EC 81 MG tablet Take 81 mg by mouth 3 (three) times a week. (Patient not taking: Reported on 03/19/2020)    . Cholecalciferol (VITAMIN D3) 2000 units TABS Take 2,000 Units by mouth daily.     . ferrous sulfate 325 (65 FE) MG tablet Take 1 tablet (325 mg total) by mouth daily with breakfast. 90 tablet 3  . Omega-3 Fatty Acids (FISH OIL) 1000 MG CAPS Take 1,000 mg by mouth daily. (Patient not taking: Reported on 03/19/2020)    . vitamin B-12 (CYANOCOBALAMIN) 1000 MCG tablet Take 1,000 mcg by mouth daily.     No current facility-administered medications on file prior to visit.  ros - All otherwise neg per pt  Lab Results  Component Value Date   WBC 6.0 08/20/2017   HGB 12.9 08/20/2017   HCT 39.9 08/20/2017   PLT 215 08/20/2017   GLUCOSE 113 (H) 08/20/2017   CHOL 193 07/11/2015   TRIG 132.0 07/11/2015   HDL 62.20 07/11/2015   LDLDIRECT 94.1 06/01/2012   LDLCALC 105 (H) 07/11/2015   ALT 21 06/04/2016   AST 27 06/04/2016   NA 141 08/20/2017   K 4.8 08/20/2017   CL 108 08/20/2017   CREATININE 0.99 08/20/2017   BUN 23 (H) 08/20/2017   CO2 23 08/20/2017   TSH 2.40 07/11/2015   INR 1.00 06/04/2016   HGBA1C 5.9 (H) 08/20/2017   MICROALBUR 13.3 (H) 06/29/2013   A/P/next steps:   cpx - Overall  doing well, age appropriate education and counseling updated, referrals for preventative services and immunizations addressed, dietary and smoking counseling addressed, most recent labs reviewed.  I have personally reviewed and have noted:  1) the patient's medical and social history 2) The pt's use of alcohol, tobacco, and illicit drugs 3) The patient's current medications and supplements 4) Functional ability including ADL's, fall risk, home safety risk, hearing and visual impairment 5) Diet and physical activities 6) Evidence for depression or mood disorder 7) The patient's height, weight, and BMI have been recorded in the chart  I have  made referrals, and provided counseling and education based on review of the above  For labs next wk at [place] md

## 2020-03-30 ENCOUNTER — Encounter: Payer: Self-pay | Admitting: Internal Medicine

## 2020-03-30 NOTE — Assessment & Plan Note (Signed)
stable overall by history and exam, recent data reviewed with pt, and pt to continue medical treatment as before,  to f/u any worsening symptoms or concerns  

## 2020-03-30 NOTE — Patient Instructions (Signed)
Please go to the LAB at the blood drawing area for the tests to be done - next wk at the [facility] lab

## 2020-03-30 NOTE — Assessment & Plan Note (Signed)

## 2020-04-11 ENCOUNTER — Telehealth: Payer: Self-pay | Admitting: Internal Medicine

## 2020-04-11 NOTE — Telephone Encounter (Signed)
° °  Patient requesting order for Meloxicam for her arthritis pain She states her sister takes the medication and thinks it may help her as well  Please advise

## 2020-04-11 NOTE — Telephone Encounter (Signed)
Sent to Dr. John. 

## 2020-04-11 NOTE — Telephone Encounter (Signed)
Sorry, I am unable as pt has not her kidney function checked since 2019, and mobic can cause kidney slowing

## 2020-04-12 NOTE — Telephone Encounter (Signed)
Patient calling back, made patient aware of Dr. Judi Cong response, patient wondering if she needs to get her kidney function checked.

## 2020-04-12 NOTE — Telephone Encounter (Signed)
Ok sure we can see pt at a ROV anytime, thanks

## 2020-04-12 NOTE — Telephone Encounter (Signed)
Sent to Dr. John. 

## 2020-04-15 NOTE — Telephone Encounter (Signed)
Pt has been added to Dr. Gwynn Burly schedule for 04/29/2020 @ 8am.

## 2020-04-29 ENCOUNTER — Ambulatory Visit: Payer: Medicare Other | Admitting: Internal Medicine

## 2020-06-04 ENCOUNTER — Other Ambulatory Visit (INDEPENDENT_AMBULATORY_CARE_PROVIDER_SITE_OTHER): Payer: Medicare Other

## 2020-06-04 DIAGNOSIS — E559 Vitamin D deficiency, unspecified: Secondary | ICD-10-CM

## 2020-06-04 DIAGNOSIS — E1165 Type 2 diabetes mellitus with hyperglycemia: Secondary | ICD-10-CM | POA: Diagnosis not present

## 2020-06-04 DIAGNOSIS — E538 Deficiency of other specified B group vitamins: Secondary | ICD-10-CM

## 2020-06-04 LAB — CBC WITH DIFFERENTIAL/PLATELET
Basophils Absolute: 0 10*3/uL (ref 0.0–0.1)
Basophils Relative: 0.4 % (ref 0.0–3.0)
Eosinophils Absolute: 0 10*3/uL (ref 0.0–0.7)
Eosinophils Relative: 0.3 % (ref 0.0–5.0)
HCT: 33.4 % — ABNORMAL LOW (ref 36.0–46.0)
Hemoglobin: 10.9 g/dL — ABNORMAL LOW (ref 12.0–15.0)
Lymphocytes Relative: 12 % (ref 12.0–46.0)
Lymphs Abs: 1.3 10*3/uL (ref 0.7–4.0)
MCHC: 32.7 g/dL (ref 30.0–36.0)
MCV: 84.9 fl (ref 78.0–100.0)
Monocytes Absolute: 0.8 10*3/uL (ref 0.1–1.0)
Monocytes Relative: 6.7 % (ref 3.0–12.0)
Neutro Abs: 9.1 10*3/uL — ABNORMAL HIGH (ref 1.4–7.7)
Neutrophils Relative %: 80.6 % — ABNORMAL HIGH (ref 43.0–77.0)
Platelets: 359 10*3/uL (ref 150.0–400.0)
RBC: 3.93 Mil/uL (ref 3.87–5.11)
RDW: 13.7 % (ref 11.5–15.5)
WBC: 11.2 10*3/uL — ABNORMAL HIGH (ref 4.0–10.5)

## 2020-06-04 LAB — URINALYSIS, ROUTINE W REFLEX MICROSCOPIC
Bilirubin Urine: NEGATIVE
Nitrite: POSITIVE — AB
Specific Gravity, Urine: 1.025 (ref 1.000–1.030)
Urine Glucose: NEGATIVE
Urobilinogen, UA: 0.2 (ref 0.0–1.0)
pH: 6 (ref 5.0–8.0)

## 2020-06-04 LAB — MICROALBUMIN / CREATININE URINE RATIO
Creatinine,U: 158.7 mg/dL
Microalb Creat Ratio: 4.3 mg/g (ref 0.0–30.0)
Microalb, Ur: 6.8 mg/dL — ABNORMAL HIGH (ref 0.0–1.9)

## 2020-06-04 LAB — TSH: TSH: 1.6 u[IU]/mL (ref 0.35–4.50)

## 2020-06-04 LAB — HEMOGLOBIN A1C: Hgb A1c MFr Bld: 6.6 % — ABNORMAL HIGH (ref 4.6–6.5)

## 2020-06-04 LAB — HEPATIC FUNCTION PANEL
ALT: 17 U/L (ref 0–35)
AST: 51 U/L — ABNORMAL HIGH (ref 0–37)
Albumin: 3.9 g/dL (ref 3.5–5.2)
Alkaline Phosphatase: 96 U/L (ref 39–117)
Bilirubin, Direct: 0 mg/dL (ref 0.0–0.3)
Total Bilirubin: 0.4 mg/dL (ref 0.2–1.2)
Total Protein: 7.4 g/dL (ref 6.0–8.3)

## 2020-06-04 LAB — LIPID PANEL
Cholesterol: 164 mg/dL (ref 0–200)
HDL: 35.6 mg/dL — ABNORMAL LOW (ref >39.00)
LDL Cholesterol: 102 mg/dL — ABNORMAL HIGH (ref 0–99)
NonHDL: 128.11
Total CHOL/HDL Ratio: 5
Triglycerides: 133 mg/dL (ref 0.0–149.0)
VLDL: 26.6 mg/dL (ref 0.0–40.0)

## 2020-06-04 LAB — BASIC METABOLIC PANEL
BUN: 13 mg/dL (ref 6–23)
CO2: 27 mEq/L (ref 19–32)
Calcium: 9.5 mg/dL (ref 8.4–10.5)
Chloride: 101 mEq/L (ref 96–112)
Creatinine, Ser: 0.75 mg/dL (ref 0.40–1.20)
GFR: 72.44 mL/min (ref >60.00)
Glucose, Bld: 139 mg/dL — ABNORMAL HIGH (ref 70–99)
Potassium: 4.2 mEq/L (ref 3.5–5.1)
Sodium: 137 mEq/L (ref 135–145)

## 2020-06-04 LAB — VITAMIN D 25 HYDROXY (VIT D DEFICIENCY, FRACTURES): VITD: 37.01 ng/mL (ref 30.00–100.00)

## 2020-06-04 LAB — VITAMIN B12: Vitamin B-12: >1506 pg/mL — ABNORMAL HIGH (ref 211–911)

## 2020-06-08 ENCOUNTER — Encounter: Payer: Self-pay | Admitting: Internal Medicine

## 2020-06-08 ENCOUNTER — Other Ambulatory Visit: Payer: Self-pay | Admitting: Internal Medicine

## 2020-06-08 MED ORDER — ATORVASTATIN CALCIUM 20 MG PO TABS: 20.0000 mg | ORAL_TABLET | Freq: Every day | ORAL | 3 refills | Status: AC

## 2020-06-11 ENCOUNTER — Telehealth: Payer: Self-pay | Admitting: Internal Medicine

## 2020-06-11 DIAGNOSIS — N950 Postmenopausal bleeding: Secondary | ICD-10-CM

## 2020-06-11 MED ORDER — MELOXICAM 15 MG PO TABS: 15.0000 mg | ORAL_TABLET | Freq: Every day | ORAL | 1 refills | Status: DC | PRN

## 2020-06-11 NOTE — Telephone Encounter (Signed)
Ok done to [facility] 

## 2020-06-11 NOTE — Telephone Encounter (Signed)
Patient states she has been having some spotting about once a week for the past 3 months and she hasn't had that since she still had her menstrual cycle. She is wondering if she needs to see GYN or what she needs to do

## 2020-06-11 NOTE — Telephone Encounter (Signed)
Patient wondering if she can get the meloxicam now that she got the blood work done.

## 2020-06-11 NOTE — Addendum Note (Signed)
Addended by: Biagio Borg on: 06/11/2020 09:38 PM   Modules accepted: Orders

## 2020-06-11 NOTE — Telephone Encounter (Signed)
See below

## 2020-06-12 NOTE — Telephone Encounter (Signed)
Yes, that is very important, so I will refer to GYN

## 2020-06-13 NOTE — Telephone Encounter (Signed)
Spoke with patient today. 

## 2020-06-20 ENCOUNTER — Encounter: Payer: Self-pay | Admitting: Obstetrics & Gynecology

## 2020-06-20 ENCOUNTER — Ambulatory Visit: Payer: Medicare Other

## 2020-06-20 ENCOUNTER — Ambulatory Visit: Payer: Medicare Other | Admitting: Obstetrics & Gynecology

## 2020-06-20 ENCOUNTER — Other Ambulatory Visit: Payer: Self-pay

## 2020-06-20 VITALS — Ht 64.0 in | Wt 152.0 lb

## 2020-06-20 DIAGNOSIS — Z1272 Encounter for screening for malignant neoplasm of vagina: Secondary | ICD-10-CM | POA: Diagnosis not present

## 2020-06-20 DIAGNOSIS — N950 Postmenopausal bleeding: Secondary | ICD-10-CM

## 2020-06-20 NOTE — Progress Notes (Addendum)
    Elias-Fela Solis June 01, 1932 [id number]        87 y.o.  G2P2L2  Widowed   RP: PMB on-off x 5-6 months  HPI: Mild pinkish PMB about once a week for the last 5-6 months. Stopped BB ASA and NSAIDs with no change in PMB.  Postmenopausal x many years on no HRT.  S/P Total Hysterectomy with BSO for a benign ovarian lesion about 35 yrs ago per patient. No pelvic pain.  Abstinent.  H/O bladder Ca, had 4 bladder surgeries. U/A showed trace intact RBC on 06/08/20.  Mild anemia with Hb at 10.9 on 06/08/20.   OB History  Gravida Para Term Preterm AB Living  2 2       2   SAB IAB Ectopic Multiple Live Births               # Outcome Date GA Lbr Len/2nd Weight Sex Delivery Anes PTL Lv  2 Para           1 Para             Past medical history,surgical history, problem list, medications, allergies, family history and social history were all reviewed and documented in the EPIC chart.   Directed ROS with pertinent positives and negatives documented in the history of present illness/assessment and plan.  Exam:  Vitals:   06/20/20 1005  Weight: 152 lb (68.9 kg)  Height: 5\' 4"  (1.626 m)   General appearance:  Normal  Abdomen: Normal  Gynecologic exam: Vulva normal.  Speculum:  Vaginal vault with mild erythema.  Pap reflex done at that level.  No blood.  Bimanual exam:  No pelvic mass, NT.   Pelvic US today:  Uterus/cervix/ovaries absent. Vaginal vault normal, no thickening or pelvic mass seen.  Mild clear free fluid seen in the cul-the-sac.   Assessment/Plan:  87 y.o. G2P2   1. Postmenopausal bleeding Pelvic US negative.  Pending Pap reflex of the vaginal vault. If Pap is abnormal, will proceed with Colposcopy.  Patient will call her Urologist for investigation of possible hematuria.  Patient voiced understanding and agreement with the plan. - US Transvaginal Non-OB  Princess Bruins MD, 10:32 AM 06/20/2020

## 2020-06-21 ENCOUNTER — Encounter: Payer: Self-pay | Admitting: Obstetrics & Gynecology

## 2020-06-24 LAB — PAP IG W/ RFLX HPV ASCU

## 2020-07-11 DIAGNOSIS — C672 Malignant neoplasm of lateral wall of bladder: Secondary | ICD-10-CM | POA: Diagnosis not present

## 2020-07-11 DIAGNOSIS — R310 Gross hematuria: Secondary | ICD-10-CM | POA: Diagnosis not present

## 2020-07-11 DIAGNOSIS — N3000 Acute cystitis without hematuria: Secondary | ICD-10-CM | POA: Diagnosis not present

## 2020-07-12 ENCOUNTER — Other Ambulatory Visit: Payer: Self-pay | Admitting: Urology

## 2020-07-12 ENCOUNTER — Encounter: Payer: Self-pay | Admitting: Internal Medicine

## 2020-07-12 ENCOUNTER — Ambulatory Visit (INDEPENDENT_AMBULATORY_CARE_PROVIDER_SITE_OTHER): Payer: Medicare Other | Admitting: Internal Medicine

## 2020-07-12 ENCOUNTER — Other Ambulatory Visit: Payer: Self-pay

## 2020-07-12 VITALS — BP 126/74 | HR 66 | Temp 97.8°F | Ht 64.0 in | Wt 150.0 lb

## 2020-07-12 DIAGNOSIS — H6692 Otitis media, unspecified, left ear: Secondary | ICD-10-CM | POA: Diagnosis not present

## 2020-07-12 DIAGNOSIS — R42 Dizziness and giddiness: Secondary | ICD-10-CM

## 2020-07-12 DIAGNOSIS — J309 Allergic rhinitis, unspecified: Secondary | ICD-10-CM | POA: Diagnosis not present

## 2020-07-12 DIAGNOSIS — E7800 Pure hypercholesterolemia, unspecified: Secondary | ICD-10-CM | POA: Diagnosis not present

## 2020-07-12 MED ORDER — MECLIZINE HCL 12.5 MG PO TABS: 12.5000 mg | ORAL_TABLET | Freq: Three times a day (TID) | ORAL | 1 refills | Status: DC | PRN

## 2020-07-12 MED ORDER — PREDNISONE 10 MG PO TABS: ORAL_TABLET | ORAL | 0 refills | Status: DC

## 2020-07-12 MED ORDER — LEVOFLOXACIN 500 MG PO TABS: 500.0000 mg | ORAL_TABLET | Freq: Every day | ORAL | 0 refills | Status: AC

## 2020-07-12 NOTE — Progress Notes (Signed)
Patient ID: Rhonda Norman, female   DOB: 1932-07-25, 87 y.o.   MRN: [id number]        Chief Complaint: left ear pain, alelrgies, vertigo, hld       HPI:  Rhonda Norman is a 87 y.o. female here with c/o 1-2 wk gradually worsening left ear pain, pressure, feverish at times, muffled hearing, and recurring positional type vertigo sensation as she has had previously. Actually the vertigo has been recurring mild intemittent but persistently for over 1 yr, but worse in the past wk.  Denies other focal neuro s/s or vision changes.  Does have several wks ongoing nasal allergy symptoms with clearish congestion, itch and sneezing, without fever, pain, ST, cough, swelling or wheezing.  Pt is hoping for antibx and steroid tx that seemed to work for her a few years ago with same symptoms.  Pt denies chest pain, increased sob or doe, wheezing, orthopnea, PND, increased LE swelling, palpitations, or syncope.   Pt denies polydipsia, polyuria,  Pt denies unintentional wt loss, night sweats, loss of appetite, or other constitutional symptoms  Wt Readings from Last 3 Encounters:  07/12/20 150 lb (68 kg)  06/20/20 152 lb (68.9 kg)  12/06/17 171 lb (77.6 kg)   BP Readings from Last 3 Encounters:  07/12/20 126/74  12/06/17 128/80  10/27/17 118/76         Past Medical History:  Diagnosis Date  . Allergic rhinitis 12/16/2016  . Anxiety   . Arthritis    DJD-right knee  . Atrial tachycardia, paroxysmal ([place])   . Bladder cancer [facility]) urologist- dr Tammi Klippel   now recurrent  w/ hx high grade superficial carcinoma in 2015 s/p TURBT and chemo instillation  . CAD (coronary artery disease)    non obstructive, [name], april 2010  . Depression   . Full dentures   . GERD (gastroesophageal reflux disease)   . History of iron deficiency anemia   . Hyperlipidemia   . Hypertension   . Pre-diabetes    March 2017  A1C was 6.3.  She takes no meds   Past Surgical History:  Procedure Laterality Date  .  ABDOMINAL HYSTERECTOMY  1970's   w/ bilateral salpingoophorectomy  . CARDIAC CATHETERIZATION  08-22-2008  dr Cathie Olden   mild to moderate coronary artery irregularies/  dRCA 30%/  ef 60%/  possible coronary spasm  . CARDIOVASCULAR STRESS TEST  08-15-2008  dr Cathie Olden   abnormal stress dual isotope/  BP drop following exercise is concern/ no evidence ischenmia/ LV function normal/ ef 74%  . CATARACT EXTRACTION W/ INTRAOCULAR LENS  IMPLANT, BILATERAL  2013  . CYSTOSCOPY W/ RETROGRADES Bilateral 02/21/2014   Procedure: CYSTOSCOPY WITH RETROGRADE PYELOGRAM, Bilateral ;  Surgeon: [name], MD;  Location: [facility];  Service: Urology;  Laterality: Bilateral;  . CYSTOSCOPY W/ RETROGRADES Bilateral 01/01/2016   Procedure: CYSTOSCOPY WITH RETROGRADE PYELOGRAM;  Surgeon: [name], MD;  Location: [facility];  Service: Urology;  Laterality: Bilateral;  . CYSTOSCOPY W/ RETROGRADES Left 08/27/2017   Procedure: BLUE LIGHT CYSTOSCOPY WITH CYSCVIEW WITH LEFT RETROGRADE WYOVZCHY;  Surgeon: [name], MD;  Location: [facility];  Service: Urology;  Laterality: Left;  . CYSTOSCOPY W/ URETERAL STENT PLACEMENT Bilateral 01/04/2014   Procedure: CYSTOSCOPY WITH bilateral RETROGRADE PYELOGRAM;  Surgeon: [name], MD;  Location: [facility];  Service: Urology;  Laterality: Bilateral;  . PARTIAL KNEE ARTHROPLASTY Right 06/11/2016   Procedure: RIGHT UNICOMPARTMENTAL KNEE ARTHROPLASTY;  Surgeon: Leandrew Koyanagi, MD;  Location: [place];  Service: Orthopedics;  Laterality: Right;  . TRANSURETHRAL RESECTION OF BLADDER TUMOR N/A 01/01/2016   Procedure: TRANSURETHRAL RESECTION OF BLADDER TUMOR (TURBT) WITH MITOMYCIN;  Surgeon: [name], MD;  Location: [facility];  Service: Urology;  Laterality: N/A;  . TRANSURETHRAL RESECTION OF BLADDER TUMOR N/A 08/27/2017   Procedure: TRANSURETHRAL RESECTION OF BLADDER TUMOR (TURBT);  Surgeon: [name], MD;  Location:  [facility];  Service: Urology;  Laterality: N/A;  . TRANSURETHRAL RESECTION OF BLADDER TUMOR WITH GYRUS (TURBT-GYRUS) N/A 01/04/2014   Procedure: TRANSURETHRAL RESECTION OF BLADDER TUMOR WITH GYRUS (TURBT-GYRUS);  Surgeon: [name], MD;  Location: [facility];  Service: Urology;  Laterality: N/A;  . TRANSURETHRAL RESECTION OF BLADDER TUMOR WITH GYRUS (TURBT-GYRUS) N/A 02/21/2014   Procedure: TRANSURETHRAL RESECTION OF BLADDER TUMOR WITH GYRUS (TURBT-GYRUS);  Surgeon: [name], MD;  Location: [facility];  Service: Urology;  Laterality: N/A;    reports that she has never smoked. She has never used smokeless tobacco. She reports current alcohol use. She reports that she does not use drugs. family history includes Cancer in her mother; Hypertension in her father. Allergies  Allergen Reactions  . Codeine Nausea And Vomiting  . Penicillins Other (See Comments)    Unknown childhood reaction Has patient had a PCN reaction causing immediate rash, facial/tongue/throat swelling, SOB or lightheadedness with hypotension: Unknown Has patient had a PCN reaction causing severe rash involving mucus membranes or skin necrosis: Unknown Has patient had a PCN reaction that required hospitalization: Unknown Has patient had a PCN reaction occurring within the last 10 years: No If all of the above answers are "NO", then may proceed with Cephalosporin use.    Current Outpatient Medications on File Prior to Visit  Medication Sig Dispense Refill  . amLODipine (NORVASC) 5 MG tablet Take 1 tablet by mouth once daily 90 tablet 3  . aspirin EC 81 MG tablet Take 81 mg by mouth 3 (three) times a week.    Marland Kitchen atorvastatin (LIPITOR) 20 MG tablet Take 1 tablet (20 mg total) by mouth daily. 90 tablet 3  . Cholecalciferol (VITAMIN D3) 2000 units TABS Take 2,000 Units by mouth daily.     . ferrous sulfate 325 (65 FE) MG tablet Take 1 tablet (325 mg total) by mouth daily with breakfast. 90  tablet 3  . irbesartan (AVAPRO) 300 MG tablet Take 1 tablet (300 mg total) by mouth daily. 90 tablet 3  . meloxicam (MOBIC) 15 MG tablet Take 1 tablet (15 mg total) by mouth daily as needed for pain. 30 tablet 1  . Omega-3 Fatty Acids (FISH OIL) 1000 MG CAPS Take 1,000 mg by mouth daily.    . QUEtiapine (SEROQUEL) 50 MG tablet Take 1 tablet (50 mg total) by mouth at bedtime as needed. 90 tablet 3  . vitamin B-12 (CYANOCOBALAMIN) 1000 MCG tablet Take 1,000 mcg by mouth daily.    . metoprolol tartrate (LOPRESSOR) 25 MG tablet 1 by mouth twice per day (Patient not taking: Reported on 07/12/2020) 180 tablet 3   No current facility-administered medications on file prior to visit.        ROS:  All others reviewed and negative.  Objective        PE:  BP 126/74   Pulse 66   Temp 97.8 F (36.6 C) (Oral)   Ht 5\' 4"  (1.626 m)   Wt 150 lb (68 kg)   SpO2 99%   BMI 25.75 kg/m  Constitutional: Pt appears in NAD               HENT: Head: NCAT.                Right Ear: External ear normal.                 Left Ear: External ear normal. Left TM with moderate erythema and effusion               Eyes: . Pupils are equal, round, and reactive to light. Conjunctivae and EOM are normal               Nose: without d/c or deformity               Neck: Neck supple. Gross normal ROM               Cardiovascular: Normal rate and regular rhythm.                 Pulmonary/Chest: Effort normal and breath sounds without rales or wheezing.                Abd:  Soft, NT, ND, + BS, no organomegaly               Neurological: Pt is alert. At baseline orientation, motor grossly intact               Skin: Skin is warm. No rashes, no other new lesions, LE edema -none               Psychiatric: Pt behavior is normal without agitation   Micro: none  Cardiac tracings I have personally interpreted today:  none  Pertinent Radiological findings (summarize): none   Lab Results  Component Value Date    WBC 11.2 (H) 06/04/2020   HGB 10.9 (L) 06/04/2020   HCT 33.4 (L) 06/04/2020   PLT 359.0 06/04/2020   GLUCOSE 139 (H) 06/04/2020   CHOL 164 06/04/2020   TRIG 133.0 06/04/2020   HDL 35.60 (L) 06/04/2020   LDLDIRECT 94.1 06/01/2012   LDLCALC 102 (H) 06/04/2020   ALT 17 06/04/2020   AST 51 (H) 06/04/2020   NA 137 06/04/2020   K 4.2 06/04/2020   CL 101 06/04/2020   CREATININE 0.75 06/04/2020   BUN 13 06/04/2020   CO2 27 06/04/2020   TSH 1.60 06/04/2020   INR 1.00 06/04/2016   HGBA1C 6.6 (H) 06/04/2020   MICROALBUR 6.8 (H) 06/04/2020   Assessment/Plan:  Rhonda Norman is a 87 y.o. White or Caucasian [1] female with  has a past medical history of Allergic rhinitis (12/16/2016), Anxiety, Arthritis, Atrial tachycardia, paroxysmal ([place]), Bladder cancer [facility]) (urologist- dr Tammi Klippel), CAD (coronary artery disease), Depression, Full dentures, GERD (gastroesophageal reflux disease), History of iron deficiency anemia, Hyperlipidemia, Hypertension, and Pre-diabetes.  Allergic rhinitis Mild to mod, for predpac asd,,  to f/u any worsening symptoms or concerns  Vertigo Ongoing persistently recurrent for just over 1 yr, declines head mri,  to f/u any worsening symptoms or concerns  Left otitis media Mild to mod, for antibx course,  to f/u any worsening symptoms or concerns  Hyperlipidemia Tolerating new statin  Lab Results  Component Value Date   LDLCALC 102 (H) 06/04/2020  to continue lipitor 20 and lower chol diet, declines change in med for now with goal ldl < 70  Followup: Return in about 6 months (around 01/12/2021).  Cathlean Cower, MD 07/13/2020 5:31 PM [place]  [facility]

## 2020-07-12 NOTE — Patient Instructions (Signed)
Please take all new medication as prescribed - the antibiotic for the left ear  Please take all new medication as prescribed - the prednisone for allergies  Please take all new medication as prescribed - the meclizine as needed for dizziness  Please continue all other medications as before, and refills have been done if requested.  Please have the pharmacy call with any other refills you may need.  Please keep your appointments with your specialists as you may have planned  Please make an Appointment to return in 6 months, or sooner if needed

## 2020-07-13 ENCOUNTER — Encounter: Payer: Self-pay | Admitting: Internal Medicine

## 2020-07-13 NOTE — Assessment & Plan Note (Signed)
Mild to mod, for predpac asd,,  to f/u any worsening symptoms or concerns 

## 2020-07-13 NOTE — Assessment & Plan Note (Signed)
Mild to mod, for antibx course,  to f/u any worsening symptoms or concerns 

## 2020-07-13 NOTE — Assessment & Plan Note (Signed)
Ongoing persistently recurrent for just over 1 yr, declines head mri,  to f/u any worsening symptoms or concerns

## 2020-07-13 NOTE — Assessment & Plan Note (Signed)
Tolerating new statin  Lab Results  Component Value Date   LDLCALC 102 (H) 06/04/2020  to continue lipitor 20 and lower chol diet, declines change in med for now with goal ldl < 70

## 2020-08-08 ENCOUNTER — Encounter (HOSPITAL_BASED_OUTPATIENT_CLINIC_OR_DEPARTMENT_OTHER): Payer: Self-pay | Admitting: Urology

## 2020-08-08 ENCOUNTER — Other Ambulatory Visit: Payer: Self-pay
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
## 2020-08-13 ENCOUNTER — Other Ambulatory Visit (HOSPITAL_COMMUNITY)
Admission: RE | Admit: 2020-08-13 | Discharge: 2020-08-13 | Disposition: A | Discharge status: Home / Self Care | Payer: Medicare Other | Source: Ambulatory Visit | Attending: Urology | Admitting: Urology

## 2020-08-13 DIAGNOSIS — Z01812 Encounter for preprocedural laboratory examination: Secondary | ICD-10-CM | POA: Insufficient documentation

## 2020-08-13 DIAGNOSIS — Z20822 Contact with and (suspected) exposure to covid-19: Secondary | ICD-10-CM | POA: Diagnosis not present

## 2020-08-13 LAB — SARS CORONAVIRUS 2 (TAT 6-24 HRS): SARS Coronavirus 2: NEGATIVE

## 2020-08-14 ENCOUNTER — Encounter (HOSPITAL_BASED_OUTPATIENT_CLINIC_OR_DEPARTMENT_OTHER): Payer: Self-pay | Admitting: Urology

## 2020-08-14 DIAGNOSIS — T63301A Toxic effect of unspecified spider venom, accidental (unintentional), initial encounter: Secondary | ICD-10-CM

## 2020-08-14 HISTORY — DX: Toxic effect of unspecified spider venom, accidental (unintentional), initial encounter: T63.301A

## 2020-08-16 ENCOUNTER — Ambulatory Visit (HOSPITAL_BASED_OUTPATIENT_CLINIC_OR_DEPARTMENT_OTHER): Payer: Medicare Other | Admitting: Anesthesiology

## 2020-08-16 ENCOUNTER — Other Ambulatory Visit: Payer: Self-pay

## 2020-08-16 ENCOUNTER — Ambulatory Visit (HOSPITAL_BASED_OUTPATIENT_CLINIC_OR_DEPARTMENT_OTHER)
Admission: RE | Admit: 2020-08-16 | Discharge: 2020-08-16 | Disposition: A | Discharge status: Home / Self Care | Payer: Medicare Other | Attending: Urology | Admitting: Urology

## 2020-08-16 ENCOUNTER — Encounter (HOSPITAL_BASED_OUTPATIENT_CLINIC_OR_DEPARTMENT_OTHER): Payer: Self-pay | Admitting: Urology

## 2020-08-16 ENCOUNTER — Encounter (HOSPITAL_BASED_OUTPATIENT_CLINIC_OR_DEPARTMENT_OTHER)
Admission: RE | Disposition: A | Discharge status: Home / Self Care | Payer: Self-pay | Source: Home / Self Care | Attending: Urology

## 2020-08-16 DIAGNOSIS — N281 Cyst of kidney, acquired: Secondary | ICD-10-CM | POA: Insufficient documentation

## 2020-08-16 DIAGNOSIS — C674 Malignant neoplasm of posterior wall of bladder: Secondary | ICD-10-CM | POA: Insufficient documentation

## 2020-08-16 DIAGNOSIS — Z79899 Other long term (current) drug therapy: Secondary | ICD-10-CM | POA: Insufficient documentation

## 2020-08-16 DIAGNOSIS — E785 Hyperlipidemia, unspecified: Secondary | ICD-10-CM | POA: Diagnosis not present

## 2020-08-16 DIAGNOSIS — Z96651 Presence of right artificial knee joint: Secondary | ICD-10-CM | POA: Insufficient documentation

## 2020-08-16 DIAGNOSIS — I10 Essential (primary) hypertension: Secondary | ICD-10-CM | POA: Diagnosis not present

## 2020-08-16 DIAGNOSIS — I2510 Atherosclerotic heart disease of native coronary artery without angina pectoris: Secondary | ICD-10-CM | POA: Diagnosis not present

## 2020-08-16 DIAGNOSIS — C679 Malignant neoplasm of bladder, unspecified: Secondary | ICD-10-CM | POA: Diagnosis not present

## 2020-08-16 HISTORY — DX: Presence of spectacles and contact lenses: Z97.3

## 2020-08-16 HISTORY — PX: CYSTOSCOPY W/ RETROGRADES: SHX1426

## 2020-08-16 HISTORY — PX: TRANSURETHRAL RESECTION OF BLADDER TUMOR: SHX2575

## 2020-08-16 HISTORY — DX: Presence of dental prosthetic device (complete) (partial): Z97.2

## 2020-08-16 LAB — POCT I-STAT, CHEM 8
BUN: 17 mg/dL (ref 8–23)
Calcium, Ion: 1.28 mmol/L (ref 1.15–1.40)
Chloride: 104 mmol/L (ref 98–111)
Creatinine, Ser: 0.8 mg/dL (ref 0.44–1.00)
Glucose, Bld: 122 mg/dL — ABNORMAL HIGH (ref 70–99)
HCT: 36 % (ref 36.0–46.0)
Hemoglobin: 12.2 g/dL (ref 12.0–15.0)
Potassium: 4.4 mmol/L (ref 3.5–5.1)
Sodium: 141 mmol/L (ref 135–145)
TCO2: 24 mmol/L (ref 22–32)

## 2020-08-16 SURGERY — TURBT (TRANSURETHRAL RESECTION OF BLADDER TUMOR)
Anesthesia: General | Site: Urethra

## 2020-08-16 MED ORDER — PROPOFOL 10 MG/ML IV BOLUS
INTRAVENOUS | Status: DC | PRN
  Administered 2020-08-16: 90 mg via INTRAVENOUS

## 2020-08-16 MED ORDER — BACITRACIN ZINC 500 UNIT/GM EX OINT
TOPICAL_OINTMENT | CUTANEOUS | Status: DC | PRN
  Administered 2020-08-16: 1 via TOPICAL

## 2020-08-16 MED ORDER — TRAMADOL HCL 50 MG PO TABS: 50.0000 mg | ORAL_TABLET | Freq: Four times a day (QID) | ORAL | 0 refills | Status: DC | PRN

## 2020-08-16 MED ORDER — EPHEDRINE 5 MG/ML INJ
INTRAVENOUS | Status: AC
  Filled 2020-08-16: qty 10

## 2020-08-16 MED ORDER — LACTATED RINGERS IV SOLN: INTRAVENOUS | Status: DC

## 2020-08-16 MED ORDER — FENTANYL CITRATE (PF) 100 MCG/2ML IJ SOLN
INTRAMUSCULAR | Status: AC
  Filled 2020-08-16: qty 2

## 2020-08-16 MED ORDER — LIDOCAINE 2% (20 MG/ML) 5 ML SYRINGE
INTRAMUSCULAR | Status: AC
  Filled 2020-08-16: qty 5

## 2020-08-16 MED ORDER — ONDANSETRON HCL 4 MG/2ML IJ SOLN
INTRAMUSCULAR | Status: AC
  Filled 2020-08-16: qty 2

## 2020-08-16 MED ORDER — LIDOCAINE 2% (20 MG/ML) 5 ML SYRINGE
INTRAMUSCULAR | Status: DC | PRN
  Administered 2020-08-16: 80 mg via INTRAVENOUS

## 2020-08-16 MED ORDER — EPHEDRINE SULFATE-NACL 50-0.9 MG/10ML-% IV SOSY
PREFILLED_SYRINGE | INTRAVENOUS | Status: DC | PRN
  Administered 2020-08-16: 10 mg via INTRAVENOUS

## 2020-08-16 MED ORDER — ONDANSETRON HCL 4 MG/2ML IJ SOLN
INTRAMUSCULAR | Status: DC | PRN
  Administered 2020-08-16: 4 mg via INTRAVENOUS

## 2020-08-16 MED ORDER — ACETAMINOPHEN 500 MG PO TABS
ORAL_TABLET | ORAL | Status: AC
  Filled 2020-08-16: qty 2

## 2020-08-16 MED ORDER — SODIUM CHLORIDE 0.9 % IR SOLN
Status: DC | PRN
  Administered 2020-08-16: 6000 mL

## 2020-08-16 MED ORDER — GENTAMICIN SULFATE 40 MG/ML IJ SOLN
5.0000 mg/kg | INTRAVENOUS | Status: AC
  Administered 2020-08-16: 340 mg via INTRAVENOUS
  Filled 2020-08-16: qty 8.5

## 2020-08-16 MED ORDER — DEXAMETHASONE SODIUM PHOSPHATE 10 MG/ML IJ SOLN
INTRAMUSCULAR | Status: DC | PRN
  Administered 2020-08-16: 4 mg via INTRAVENOUS

## 2020-08-16 MED ORDER — FENTANYL CITRATE (PF) 100 MCG/2ML IJ SOLN
INTRAMUSCULAR | Status: DC | PRN
  Administered 2020-08-16: 25 ug via INTRAVENOUS
  Administered 2020-08-16: 50 ug via INTRAVENOUS

## 2020-08-16 MED ORDER — ACETAMINOPHEN 500 MG PO TABS
1000.0000 mg | ORAL_TABLET | Freq: Once | ORAL | Status: AC
  Administered 2020-08-16: 1000 mg via ORAL

## 2020-08-16 MED ORDER — DEXAMETHASONE SODIUM PHOSPHATE 10 MG/ML IJ SOLN
INTRAMUSCULAR | Status: AC
  Filled 2020-08-16: qty 1

## 2020-08-16 MED ORDER — IOHEXOL 300 MG/ML  SOLN
INTRAMUSCULAR | Status: DC | PRN
  Administered 2020-08-16: 15 mL via URETHRAL

## 2020-08-16 SURGICAL SUPPLY — 22 items
BAG DRAIN URO-CYSTO SKYTR STRL (DRAIN) ×3 IMPLANT
BAG DRN UROCATH (DRAIN) ×2
CATH INTERMIT  6FR 70CM (CATHETERS) ×3 IMPLANT
CLOTH BEACON ORANGE TIMEOUT ST (SAFETY) ×3 IMPLANT
DRSG TEGADERM 2-3/8X2-3/4 SM (GAUZE/BANDAGES/DRESSINGS) ×1 IMPLANT
DRSG TELFA 3X8 NADH (GAUZE/BANDAGES/DRESSINGS) ×3 IMPLANT
ELECT REM PT RETURN 9FT ADLT (ELECTROSURGICAL) ×3
ELECTRODE REM PT RTRN 9FT ADLT (ELECTROSURGICAL) ×2 IMPLANT
GLOVE SURG ENC MOIS LTX SZ7.5 (GLOVE) ×3 IMPLANT
GOWN STRL REUS W/TWL LRG LVL3 (GOWN DISPOSABLE) ×3 IMPLANT
GUIDEWIRE STR DUAL SENSOR (WIRE) ×1 IMPLANT
IV NS 1000ML (IV SOLUTION) ×3
IV NS 1000ML BAXH (IV SOLUTION) ×2 IMPLANT
IV NS IRRIG 3000ML ARTHROMATIC (IV SOLUTION) ×3 IMPLANT
KIT TURNOVER CYSTO (KITS) ×3 IMPLANT
LOOP CUT BIPOLAR 24F LRG (ELECTROSURGICAL) ×1 IMPLANT
MANIFOLD NEPTUNE II (INSTRUMENTS) ×3 IMPLANT
NS IRRIG 500ML POUR BTL (IV SOLUTION) ×3 IMPLANT
PACK CYSTO (CUSTOM PROCEDURE TRAY) ×3 IMPLANT
PAD DRESSING TELFA 3X8 NADH (GAUZE/BANDAGES/DRESSINGS) IMPLANT
SYR 10ML LL (SYRINGE) ×1 IMPLANT
TUBE CONNECTING 12X1/4 (SUCTIONS) ×1 IMPLANT

## 2020-08-16 NOTE — Discharge Instructions (Signed)
1 - You may have urinary urgency (bladder spasms) and bloody urine on / off for up to 2 weeks. This is normal.  2 - Call MD or go to ER for fever >102, severe pain / nausea / vomiting not relieved by medications, or acute change in medical status    Transurethral Resection of Bladder Tumor, Care After This sheet gives you information about how to care for yourself after your procedure. Your health care provider may also give you more specific instructions. If you have problems or questions, contact your health care provider. What can I expect after the procedure? After the procedure, it is common to have:  A small amount of blood in your urine for up to 2 weeks.  Soreness or mild pain from your catheter. After your catheter is removed, you may have mild soreness, especially when urinating.  Pain in your lower abdomen. Follow these instructions at home: Medicines  Take over-the-counter and prescription medicines only as told by your health care provider.  If you were prescribed an antibiotic medicine, take it as told by your health care provider. Do not stop taking the antibiotic even if you start to feel better.  Do not drive for 24 hours if you were given a sedative during your procedure.  Ask your health care provider if the medicine prescribed to you: ? Requires you to avoid driving or using heavy machinery. ? Can cause constipation. You may need to take these actions to prevent or treat constipation:  Take over-the-counter or prescription medicines.  Eat foods that are high in fiber, such as beans, whole grains, and fresh fruits and vegetables.  Limit foods that are high in fat and processed sugars, such as fried or sweet foods.   Activity  Return to your normal activities as told by your health care provider. Ask your health care provider what activities are safe for you.  Do not lift anything that is heavier than 10 lb (4.5 kg), or the limit that you are told, until your  health care provider says that it is safe.  Avoid intense physical activity for as long as told by your health care provider.  Rest as told by your health care provider.  Avoid sitting for a long time without moving. Get up to take short walks every 1-2 hours. This is important to improve blood flow and breathing. Ask for help if you feel weak or unsteady. General instructions  Do not drink alcohol for as long as told by your health care provider. This is especially important if you are taking prescription pain medicines.  Do not take baths, swim, or use a hot tub until your health care provider approves. Ask your health care provider if you may take showers. You may only be allowed to take sponge baths.  If you have a catheter, follow instructions from your health care provider about caring for your catheter and your drainage bag.  Drink enough fluid to keep your urine pale yellow.  Wear compression stockings as told by your health care provider. These stockings help to prevent blood clots and reduce swelling in your legs.  Keep all follow-up visits as told by your health care provider. This is important. ? You will need to be followed closely with regular checks of your bladder and urethra (cystoscopies) to make sure that the cancer does not come back.   Contact a health care provider if:  You have pain that gets worse or does not improve with medicine.    You have blood in your urine for more than 2 weeks.  You have cloudy or bad-smelling urine.  You become constipated. Signs of constipation may include having: ? Fewer than three bowel movements in a week. ? Difficulty having a bowel movement. ? Stools that are dry, hard, or larger than normal.  You have a fever. Get help right away if:  You have: ? Severe pain. ? Bright red blood in your urine. ? Blood clots in your urine. ? A lot of blood in your urine.  Your catheter has been removed and you are not able to  urinate.  You have a catheter in place and the catheter is not draining urine. Summary  After your procedure, it is common to have a small amount of blood in your urine, soreness or mild pain from your catheter, and pain in your lower abdomen.  Take over-the-counter and prescription medicines only as told by your health care provider.  Rest as told by your health care provider. Follow your health care provider's instructions about returning to normal activities. Ask what activities are safe for you.  If you have a catheter, follow instructions from your health care provider about caring for your catheter and your drainage bag.  Get help right away if you cannot urinate, you have severe pain, or you have bright red blood or blood clots in your urine. This information is not intended to replace advice given to you by your health care provider. Make sure you discuss any questions you have with your health care provider. Document Revised: 11/11/2017 Document Reviewed: 11/11/2017 Elsevier Patient Education  2021 Else[employer] Inc[employer]    Post Anesthesia Home Care Instructions  Activity: Get plenty of rest for the remainder of the day. A responsible individual must stay with you for 24 hours following the procedure.  For the next 24 hours, DO NOT: -Drive a car -Operate machinery -Drink alcoholic beverages -Take any medication unless instructed by your physician -Make any legal decisions or sign important papers.  Meals: Start with liquid foods such as gelatin or soup. Progress to regular foods as tolerated. Avoid greasy, spicy, heavy foods. If nausea and/or vomiting occur, drink only clear liquids until the nausea and/or vomiting subsides. Call your physician if vomiting continues.  Special Instructions/Symptoms: Your throat may feel dry or sore from the anesthesia or the breathing tube placed in your throat during surgery. If this causes discomfort, gargle with warm salt water. The discomfort  should disappear within 24 hours.   

## 2020-08-16 NOTE — Anesthesia Preprocedure Evaluation (Addendum)
Anesthesia Evaluation  Patient identified by MRN, date of birth, ID band Patient awake    Reviewed: Allergy & Precautions, NPO status , Patient's Chart, lab work & pertinent test results, reviewed documented beta blocker date and time   History of Anesthesia Complications Negative for: history of anesthetic complications  Airway Mallampati: II  TM Distance: >3 FB Neck ROM: Full    Dental  (+) Edentulous Lower, Edentulous Upper   Pulmonary neg pulmonary ROS,    Pulmonary exam normal        Cardiovascular hypertension, Pt. on medications and Pt. on home beta blockers + CAD  Normal cardiovascular exam     Neuro/Psych negative neurological ROS  negative psych ROS   GI/Hepatic Neg liver ROS, GERD  ,  Endo/Other  diabetes, Type 2  Renal/GU negative Renal ROS  negative genitourinary   Musculoskeletal negative musculoskeletal ROS (+)   Abdominal   Peds  Hematology negative hematology ROS (+)   Anesthesia Other Findings Day of surgery medications reviewed with patient.  Reproductive/Obstetrics negative OB ROS                            Anesthesia Physical Anesthesia Plan  ASA: II  Anesthesia Plan: General   Post-op Pain Management:    Induction: Intravenous  PONV Risk Score and Plan: 3 and Treatment may vary due to age or medical condition, Ondansetron and Dexamethasone  Airway Management Planned: LMA  Additional Equipment: None  Intra-op Plan:   Post-operative Plan: Extubation in OR  Informed Consent: I have reviewed the patients History and Physical, chart, labs and discussed the procedure including the risks, benefits and alternatives for the proposed anesthesia with the patient or authorized representative who has indicated his/her understanding and acceptance.     Dental advisory given  Plan Discussed with: CRNA  Anesthesia Plan Comments:        Anesthesia Quick  Evaluation

## 2020-08-16 NOTE — Brief Op Note (Signed)
08/16/2020  2:43 PM  PATIENT:  Rhonda Norman  87 y.o. female  PRE-OPERATIVE DIAGNOSIS:  RECURRENT BLADDER CANCER  POST-OPERATIVE DIAGNOSIS:  * No post-op diagnosis entered *  PROCEDURE:  Procedure(s) with comments: TRANSURETHRAL RESECTION OF BLADDER TUMOR (TURBT) (N/A) - 1 HR CYSTOSCOPY WITH RETROGRADE PYELOGRAMS AND EXAM UNDER ANESTHESIA (Bilateral)  SURGEON:  Surgeon(s) and Role:    * [name], MD - Primary  PHYSICIAN ASSISTANT:   ASSISTANTS: none   ANESTHESIA:   general  EBL:  minimal   BLOOD ADMINISTERED:none  DRAINS: none   LOCAL MEDICATIONS USED:  NONE  SPECIMEN:  Source of Specimen:  bladder tumor; base of bladder tumor  DISPOSITION OF SPECIMEN:  PATHOLOGY  COUNTS:  YES  TOURNIQUET:  * No tourniquets in log *  DICTATION: .Other Dictation: Dictation Number [id number]  PLAN OF CARE: Discharge to home after PACU  PATIENT DISPOSITION:  PACU - hemodynamically stable.   Delay start of Pharmacological VTE agent (>24hrs) due to surgical blood loss or risk of bleeding: not applicable

## 2020-08-16 NOTE — Anesthesia Procedure Notes (Signed)
Procedure Name: LMA Insertion Date/Time: 08/16/2020 2:17 PM Performed by: Suan Halter, CRNA Pre-anesthesia Checklist: Patient identified, Emergency Drugs available, Suction available and Patient being monitored Patient Re-evaluated:Patient Re-evaluated prior to induction Oxygen Delivery Method: Circle system utilized Preoxygenation: Pre-oxygenation with 100% oxygen Induction Type: IV induction Ventilation: Mask ventilation without difficulty LMA: LMA inserted LMA Size: 4.0 Number of attempts: 1 Airway Equipment and Method: Bite block Placement Confirmation: positive ETCO2 Tube secured with: Tape Dental Injury: Teeth and Oropharynx as per pre-operative assessment

## 2020-08-16 NOTE — Transfer of Care (Signed)
Immediate Anesthesia Transfer of Care Note  Patient: Rhonda Norman  Procedure(s) Performed: Procedure(s) (LRB): TRANSURETHRAL RESECTION OF BLADDER TUMOR (TURBT) (N/A) CYSTOSCOPY WITH RETROGRADE PYELOGRAMS AND EXAM UNDER ANESTHESIA (Bilateral)  Patient Location: PACU  Anesthesia Type: General  Level of Consciousness: awake, oriented, sedated and patient cooperative  Airway & Oxygen Therapy: Patient Spontanous Breathing and Patient connected to face mask oxygen  Post-op Assessment: Report given to PACU RN and Post -op Vital signs reviewed and stable  Post vital signs: Reviewed and stable  Complications: No apparent anesthesia complications Last Vitals:  Vitals Value Taken Time  BP    Temp    Pulse 77 08/16/20 1456  Resp 15 08/16/20 1456  SpO2 100 % 08/16/20 1456  Vitals shown include unvalidated device data.  Last Pain:  Vitals:   08/16/20 1306  TempSrc: Oral  PainSc: 0-No pain      Patients Stated Pain Goal: 3 (50/38/88 2800)  Complications: No complications documented.

## 2020-08-16 NOTE — Anesthesia Postprocedure Evaluation (Signed)
Anesthesia Post Note  Patient: Rhonda Norman  Procedure(s) Performed: TRANSURETHRAL RESECTION OF BLADDER TUMOR (TURBT) (N/A Bladder) CYSTOSCOPY WITH RETROGRADE PYELOGRAMS AND EXAM UNDER ANESTHESIA (Bilateral Urethra)     Patient location during evaluation: PACU Anesthesia Type: General Level of consciousness: awake and alert and oriented Pain management: pain level controlled Vital Signs Assessment: post-procedure vital signs reviewed and stable Respiratory status: spontaneous breathing, nonlabored ventilation and respiratory function stable Cardiovascular status: blood pressure returned to baseline Postop Assessment: no apparent nausea or vomiting Anesthetic complications: no   No complications documented.  Last Vitals:  Vitals:   08/16/20 1456 08/16/20 1500  BP:  (!) 145/62  Pulse: 77 77  Resp: (!) 21 15  Temp: 37.1 C 36.6 C  SpO2: 100% 100%    Last Pain:  Vitals:   08/16/20 1306  TempSrc: Oral  PainSc: 0-No pain                 Brennan Bailey

## 2020-08-16 NOTE — H&P (Signed)
Rhonda Norman is an 87 y.o. female.    Chief Complaint: Pre-Op Transurethral Resection of Bladder TUmor  HPI:   1- High Grade Superficial Bladder Cancer- initially found on hematuria eval 11/2013. Non smoker. No dye/textile/solvent exposure.    Summarized Recent Course:   12/2013 TaG3 (large volume 7cm Rt wall) by TURBT --> re-resection 02/2014 no evidence of disease --> Full induction BCG x6  12/2015 - TaG1 tiny dome lesion at TURBT / Mito-C (old resection site benign, muscle present) ==> continue Q97mo cysto per pr request  07/2017 cysto approx 2cm total small, very subtle left wall papillary tumors ==> TaG1 by TURBT; 01/2018 Cysto no recurrence  09/2018 cysto no recurrence  04/2019 cysto no recurrence; 09/2019 cysto  06/2020 - 1cm Rt upper trigone recurrence.    2 - Gross Hematuria - Several episodes gross blood 2015. CT and Cysto with bladder cancer as per above.    3- Bilateral Non-Complex Renal Cysts - Lt>Rt non-complex renal cysts by contrast CT 11/2013. Largest 2.5cm left. No enhancing nodules or renal cortical issues.   4 - Blood Per Vagina - pt we few weeks of blood per vagina, doesn't think hematuria. Had eval by GYN and normal per report. Bimanual exam 06/2020 with atrophic changes, some scant vaginal blood, cysto with recurrent small tumor.   PMH sig for benign hyst, knee replacement. No CV disease. No strong blood thinners. Her PCP is Rhonda Cower MD with [name].    Today Rhonda Norman is seen to proceed with cysto, retrogrades, TURBT for small bladder tumor recurrence. C19 screen negative. Most recent UCX non-clonal.     Past Medical History:  Diagnosis Date  . Allergic rhinitis 12/16/2016  . Arthritis    DJD-right knee oa arms  . Atrial tachycardia, paroxysmal ([place])   . Bladder cancer [facility]) urologist- dr Tammi Klippel   now recurrent  w/ hx high grade superficial carcinoma in 2015 s/p TURBT and chemo instillation  . CAD (coronary artery disease)    non obstructive, [name],  april 2010  . Depression   . Full dentures   . History of iron deficiency anemia   . Hyperlipidemia   . Hypertension   . Spider bite 08/14/2020   PT MASHED AREA AND STUFF CAME OUT USING NEOSPOIRN 1 X DAY TO LEFT THIGH  . Wears dentures    full  . Wears glasses     Past Surgical History:  Procedure Laterality Date  . ABDOMINAL HYSTERECTOMY  1970's   w/ bilateral salpingoophorectomy complete  . CARDIAC CATHETERIZATION  08-22-2008  dr Cathie Olden   mild to moderate coronary artery irregularies/  dRCA 30%/  ef 60%/  possible coronary spasm  . CARDIOVASCULAR STRESS TEST  08-15-2008  dr Cathie Olden   abnormal stress dual isotope/  BP drop following exercise is concern/ no evidence ischenmia/ LV function normal/ ef 74%  . CATARACT EXTRACTION W/ INTRAOCULAR LENS  IMPLANT, BILATERAL  2013  . CYSTOSCOPY W/ RETROGRADES Bilateral 02/21/2014   Procedure: CYSTOSCOPY WITH RETROGRADE PYELOGRAM, Bilateral ;  Surgeon: [name], MD;  Location: [facility];  Service: Urology;  Laterality: Bilateral;  . CYSTOSCOPY W/ RETROGRADES Bilateral 01/01/2016   Procedure: CYSTOSCOPY WITH RETROGRADE PYELOGRAM;  Surgeon: [name], MD;  Location: [facility];  Service: Urology;  Laterality: Bilateral;  . CYSTOSCOPY W/ RETROGRADES Left 08/27/2017   Procedure: BLUE LIGHT CYSTOSCOPY WITH CYSCVIEW WITH LEFT RETROGRADE CVELFYBO;  Surgeon: [name], MD;  Location: [facility];  Service: Urology;  Laterality: Left;  .  CYSTOSCOPY W/ URETERAL STENT PLACEMENT Bilateral 01/04/2014   Procedure: CYSTOSCOPY WITH bilateral RETROGRADE PYELOGRAM;  Surgeon: [name], MD;  Location: [facility];  Service: Urology;  Laterality: Bilateral;  . PARTIAL KNEE ARTHROPLASTY Right 06/11/2016   Procedure: RIGHT UNICOMPARTMENTAL KNEE ARTHROPLASTY;  Surgeon: Leandrew Koyanagi, MD;  Location: [place];  Service: Orthopedics;  Laterality: Right;  . TRANSURETHRAL RESECTION OF BLADDER TUMOR N/A 01/01/2016    Procedure: TRANSURETHRAL RESECTION OF BLADDER TUMOR (TURBT) WITH MITOMYCIN;  Surgeon: [name], MD;  Location: [facility];  Service: Urology;  Laterality: N/A;  . TRANSURETHRAL RESECTION OF BLADDER TUMOR N/A 08/27/2017   Procedure: TRANSURETHRAL RESECTION OF BLADDER TUMOR (TURBT);  Surgeon: [name], MD;  Location[facility];  Service: Urology;  Laterality: N/A;  . TRANSURETHRAL RESECTION OF BLADDER TUMOR WITH GYRUS (TURBT-GYRUS) N/A 01/04/2014   Procedure: TRANSURETHRAL RESECTION OF BLADDER TUMOR WITH GYRUS (TURBT-GYRUS);  Surgeon: [name], MD;  Location[facility];  Service: Urology;  Laterality: N/A;  . TRANSURETHRAL RESECTION OF BLADDER TUMOR WITH GYRUS (TURBT-GYRUS) N/A 02/21/2014   Procedure: TRANSURETHRAL RESECTION OF BLADDER TUMOR WITH GYRUS (TURBT-GYRUS);  Surgeon: [name], MD;  Location: [facility];  Service: Urology;  Laterality: N/A;    Family History  Problem Relation Age of Onset  . Cancer Mother   . Hypertension Father    Social History:  reports that she has never smoked. She has never used smokeless tobacco. She reports current alcohol use. She reports that she does not use drugs.  Allergies:  Allergies  Allergen Reactions  . Codeine Nausea And Vomiting  . Penicillins Nausea And Vomiting    Unknown childhood reaction Has patient had a PCN reaction causing immediate rash, facial/tongue/throat swelling, SOB or lightheadedness with hypotension: Unknown Has patient had a PCN reaction causing severe rash involving mucus membranes or skin necrosis: Unknown Has patient had a PCN reaction that required hospitalization: Unknown Has patient had a PCN reaction occurring within the last 10 years: No If all of the above answers are "NO", then may proceed with Cephalosporin use.     No medications prior to admission.    No results found for this or any previous visit (from the past 48 hour(s)). No results  found.  Review of Systems  Constitutional: Negative for chills and fever.  Genitourinary: Positive for hematuria.  All other systems reviewed and are negative.   Height 5\' 6"  (1.676 m), weight 70.3 kg. Physical Exam Vitals reviewed.  HENT:     Head: Normocephalic.     Nose: Nose normal.     Mouth/Throat:     Mouth: Mucous membranes are moist.  Cardiovascular:     Rate and Rhythm: Normal rate.  Pulmonary:     Effort: Pulmonary effort is normal.  Abdominal:     General: Abdomen is flat.  Genitourinary:    Comments: No CVAT at present.  Musculoskeletal:        General: Normal range of motion.     Cervical back: Normal range of motion.  Skin:    General: Skin is warm.  Neurological:     General: No focal deficit present.     Mental Status: She is alert.      Assessment/Plan  Proceed as planned with cysto, bilateral retrogrades, TURBT for small bladder tumor recurrence. Risks, benefits, alternatives, expected peri-op course discussed previously and reiterated today.   [name], MD 08/16/2020, 6:48 AM

## 2020-08-17 NOTE — Op Note (Signed)
NAME: Rhonda Norman, [name] MEDICAL RECORD NO: [id number] ACCOUNT NO: [id number] DATE OF BIRTH: 1933-01-17 FACILITY: [facility] LOCATION: [facility] PHYSICIAN: [name], MD  Operative Report   PREOPERATIVE DIAGNOSIS:  Recurrent bladder cancer.  PROCEDURE: 1.  Transurethral resection of bladder tumor, volume small. 2.  Bilateral retrograde pyelogram with interpretation.  DATE OF SERVICE: 08/16/2020  ESTIMATED BLOOD LOSS:  Nil.  COMPLICATIONS:  None.  SPECIMENS: 1.  Bladder tumor. 2.  Base of bladder tumor for pathology.  FINDINGS: 1.  Sessile, but small bladder tumor approximately 2 cm in the intertrigone posterior wall. 2.  Unremarkable bilateral retrograde pyelograms. 3.  Prior resection of right ureteral orifice.  INDICATIONS FOR PROCEDURE:  The patient is a very pleasant 87 year old woman with a multiyear history of recurrent bladder cancer.  She is very compliant with followup.  She was found on her most recent surveillance cystoscopy to have a significant  recurrence around 2 cm or so in the intertrigone region along with some occasional hematuria.  Options were discussed for management including recommended path of transurethral resection for diagnostic and treatment purposes and she wished to proceed.   Informed consent was obtained and placed in the medical record.  DESCRIPTION OF PROCEDURE IN DETAIL:  The patient being herself and the procedure being a transurethral resection of the bladder tumor with retrograde was confirmed.  Procedure timeout was performed.  Intravenous antibiotics were administered.  General  LMA anesthesia was induced.  The patient was placed in the low lithotomy position.  A sterile field was created, prepped and draped the patient's vagina, introitus and proximal thighs using iodine.  Cystourethroscopy was performed with a 21-French rigid  cystoscope with offset lens.  Inspection of the bladder revealed no diverticula or calcifications.  The right  ureteral orifice had been previously resected, but was visually patent.  The left ureteral orifice was unremarkable.  There was 2 cm tumor with  mostly sessile with some subtle papillary characteristics in the intertrigone area anticipated.  No additional lesions were noted.  Attention was directed to retrograde pyelography.  The left ureteral orifice was cannulated with a 6 frenc open end catheter and left  retrograde pyelogram was obtained.  Left retrograde pyelogram demonstrated a single left ureter, a single system left kidney.  No filling defects or narrowing noted.  Similarly, right retrograde pyelogram was obtained.  Right retrograde pyelogram demonstrated a single right ureter, a single system right kidney.  No filling defects or narrowing noted.  The cystoscope was then exchanged for a 26-French resectoscope sheath, visual obturator and using resectoscope loop,  very careful resection was performed of the bladder tumor down to the superficial fibromuscular stroma of the urinary bladder.  The tumor fragments were irrigated, set aside for permanent pathology.  Next, cold cup biopsy forceps were used to obtain  representative deep seromuscular samples of the deep aspect of the tumor, these were set aside, labeled as base of bladder tumor.  The area of resection was then carefully fulgurated for additional 1 cm circumference beyond this, which revealed an excellent  hemostasis.  There was no evidence of perforation.  Ureteral orifices remained visibly patent.  We achieved the goal of the surgery today.  Bladder was emptied per cystoscope.  Procedure was then terminated.  The patient tolerated the procedure well.   There were no immediate perioperative complications.  The patient was taken to postanesthesia care unit in stable condition.  Plan for discharge home.   NIK D: 08/16/2020 2:47:53 pm T: 08/17/2020 4:00:00 am  JOB: [id number]

## 2020-08-19 ENCOUNTER — Encounter (HOSPITAL_BASED_OUTPATIENT_CLINIC_OR_DEPARTMENT_OTHER): Payer: Self-pay | Admitting: Urology

## 2020-08-19 LAB — SURGICAL PATHOLOGY

## 2020-09-02 ENCOUNTER — Ambulatory Visit (INDEPENDENT_AMBULATORY_CARE_PROVIDER_SITE_OTHER): Payer: Medicare Other | Admitting: Internal Medicine

## 2020-09-02 ENCOUNTER — Other Ambulatory Visit: Payer: Self-pay | Admitting: Internal Medicine

## 2020-09-02 ENCOUNTER — Ambulatory Visit (INDEPENDENT_AMBULATORY_CARE_PROVIDER_SITE_OTHER): Payer: Medicare Other

## 2020-09-02 ENCOUNTER — Encounter: Payer: Self-pay | Admitting: Internal Medicine

## 2020-09-02 ENCOUNTER — Other Ambulatory Visit: Payer: Self-pay

## 2020-09-02 VITALS — BP 122/68 | HR 67 | Temp 98.7°F | Ht 66.0 in | Wt 147.0 lb

## 2020-09-02 DIAGNOSIS — R42 Dizziness and giddiness: Secondary | ICD-10-CM

## 2020-09-02 DIAGNOSIS — E1165 Type 2 diabetes mellitus with hyperglycemia: Secondary | ICD-10-CM

## 2020-09-02 DIAGNOSIS — J309 Allergic rhinitis, unspecified: Secondary | ICD-10-CM | POA: Diagnosis not present

## 2020-09-02 DIAGNOSIS — H6692 Otitis media, unspecified, left ear: Secondary | ICD-10-CM

## 2020-09-02 DIAGNOSIS — Z0001 Encounter for general adult medical examination with abnormal findings: Secondary | ICD-10-CM | POA: Diagnosis not present

## 2020-09-02 DIAGNOSIS — R634 Abnormal weight loss: Secondary | ICD-10-CM | POA: Diagnosis not present

## 2020-09-02 DIAGNOSIS — R8290 Unspecified abnormal findings in urine: Secondary | ICD-10-CM

## 2020-09-02 DIAGNOSIS — E538 Deficiency of other specified B group vitamins: Secondary | ICD-10-CM | POA: Diagnosis not present

## 2020-09-02 DIAGNOSIS — E559 Vitamin D deficiency, unspecified: Secondary | ICD-10-CM | POA: Diagnosis not present

## 2020-09-02 DIAGNOSIS — D649 Anemia, unspecified: Secondary | ICD-10-CM | POA: Diagnosis not present

## 2020-09-02 LAB — CBC WITH DIFFERENTIAL/PLATELET
Basophils Absolute: 0.1 10*3/uL (ref 0.0–0.1)
Basophils Relative: 0.8 % (ref 0.0–3.0)
Eosinophils Absolute: 0.1 10*3/uL (ref 0.0–0.7)
Eosinophils Relative: 0.7 % (ref 0.0–5.0)
HCT: 30.9 % — ABNORMAL LOW (ref 36.0–46.0)
Hemoglobin: 10 g/dL — ABNORMAL LOW (ref 12.0–15.0)
Lymphocytes Relative: 18 % (ref 12.0–46.0)
Lymphs Abs: 1.8 10*3/uL (ref 0.7–4.0)
MCHC: 32.3 g/dL (ref 30.0–36.0)
MCV: 82.8 fl (ref 78.0–100.0)
Monocytes Absolute: 0.8 10*3/uL (ref 0.1–1.0)
Monocytes Relative: 8.1 % (ref 3.0–12.0)
Neutro Abs: 7.2 10*3/uL (ref 1.4–7.7)
Neutrophils Relative %: 72.4 % (ref 43.0–77.0)
Platelets: 355 10*3/uL (ref 150.0–400.0)
RBC: 3.73 Mil/uL — ABNORMAL LOW (ref 3.87–5.11)
RDW: 15.1 % (ref 11.5–15.5)
WBC: 9.9 10*3/uL (ref 4.0–10.5)

## 2020-09-02 LAB — HEPATIC FUNCTION PANEL
ALT: 14 U/L (ref 0–35)
AST: 25 U/L (ref 0–37)
Albumin: 3.8 g/dL (ref 3.5–5.2)
Alkaline Phosphatase: 122 U/L — ABNORMAL HIGH (ref 39–117)
Bilirubin, Direct: 0.1 mg/dL (ref 0.0–0.3)
Total Bilirubin: 0.3 mg/dL (ref 0.2–1.2)
Total Protein: 7.4 g/dL (ref 6.0–8.3)

## 2020-09-02 LAB — BASIC METABOLIC PANEL
BUN: 16 mg/dL (ref 6–23)
CO2: 25 mEq/L (ref 19–32)
Calcium: 9.3 mg/dL (ref 8.4–10.5)
Chloride: 102 mEq/L (ref 96–112)
Creatinine, Ser: 0.7 mg/dL (ref 0.40–1.20)
GFR: 78.56 mL/min (ref >60.00)
Glucose, Bld: 142 mg/dL — ABNORMAL HIGH (ref 70–99)
Potassium: 3.9 mEq/L (ref 3.5–5.1)
Sodium: 138 mEq/L (ref 135–145)

## 2020-09-02 LAB — URINALYSIS, ROUTINE W REFLEX MICROSCOPIC
Bilirubin Urine: NEGATIVE
Nitrite: NEGATIVE
Specific Gravity, Urine: >=1.03 — AB (ref 1.000–1.030)
Total Protein, Urine: 30 — AB
Urine Glucose: NEGATIVE
Urobilinogen, UA: 0.2 (ref 0.0–1.0)
pH: 5.5 (ref 5.0–8.0)

## 2020-09-02 LAB — VITAMIN B12: Vitamin B-12: >1550 pg/mL — ABNORMAL HIGH (ref 211–911)

## 2020-09-02 LAB — LIPID PANEL
Cholesterol: 160 mg/dL (ref 0–200)
HDL: 43.4 mg/dL (ref >39.00)
LDL Cholesterol: 91 mg/dL (ref 0–99)
NonHDL: 116.15
Total CHOL/HDL Ratio: 4
Triglycerides: 127 mg/dL (ref 0.0–149.0)
VLDL: 25.4 mg/dL (ref 0.0–40.0)

## 2020-09-02 LAB — VITAMIN D 25 HYDROXY (VIT D DEFICIENCY, FRACTURES): VITD: 39.44 ng/mL (ref 30.00–100.00)

## 2020-09-02 LAB — HEMOGLOBIN A1C: Hgb A1c MFr Bld: 6.7 % — ABNORMAL HIGH (ref 4.6–6.5)

## 2020-09-02 LAB — IBC PANEL
Iron: 45 ug/dL (ref 42–145)
Saturation Ratios: 15 % — ABNORMAL LOW (ref 20.0–50.0)
Transferrin: 214 mg/dL (ref 212.0–360.0)

## 2020-09-02 LAB — FERRITIN: Ferritin: 112.2 ng/mL (ref 10.0–291.0)

## 2020-09-02 LAB — TSH: TSH: 1.92 u[IU]/mL (ref 0.35–4.50)

## 2020-09-02 MED ORDER — PREDNISONE 10 MG PO TABS: ORAL_TABLET | ORAL | 0 refills | Status: AC

## 2020-09-02 MED ORDER — METHYLPREDNISOLONE ACETATE 80 MG/ML IJ SUSP
80.0000 mg | Freq: Once | INTRAMUSCULAR | Status: AC
  Administered 2020-09-02: 80 mg via INTRAMUSCULAR

## 2020-09-02 MED ORDER — DOXYCYCLINE HYCLATE 100 MG PO TABS: 100.0000 mg | ORAL_TABLET | Freq: Two times a day (BID) | ORAL | 0 refills | Status: AC

## 2020-09-02 MED ORDER — CIPROFLOXACIN HCL 500 MG PO TABS: 500.0000 mg | ORAL_TABLET | Freq: Two times a day (BID) | ORAL | 0 refills | Status: AC

## 2020-09-02 MED ORDER — MECLIZINE HCL 12.5 MG PO TABS: 12.5000 mg | ORAL_TABLET | Freq: Three times a day (TID) | ORAL | 1 refills | Status: AC | PRN

## 2020-09-02 NOTE — Patient Instructions (Signed)
You had the steroid shot today  Please take all new medication as prescribed - the antibiotic, prednisone, and meclizine for dizziness  Please continue all other medications as before, and refills have been done if requested.  Please have the pharmacy call with any other refills you may need.  Please continue your efforts at being more active, low cholesterol diet, and weight control.  You are otherwise up to date with prevention measures today.  Please keep your appointments with your specialists as you may have planned  Please go to the XRAY Department in the first floor for the x-ray testing  Please go to the LAB at the blood drawing area for the tests to be done  You will be contacted by phone if any changes need to be made immediately.  Otherwise, you will receive a letter about your results with an explanation, but please check with MyChart first.  Please remember to sign up for MyChart if you have not done so, as this will be important to you in the future with finding out test results, communicating by private email, and scheduling acute appointments online when needed.

## 2020-09-02 NOTE — Progress Notes (Deleted)
Patient ID: Rhonda Norman, female   DOB: [birth year], 87 y.o.   MRN: [id number]        Chief Complaint: follow up HTN, HLD and hyperglycemia ***       HPI:  Rhonda Norman is a 87 y.o. female here with c/o        Wt Readings from Last 3 Encounters:  09/02/20 147 lb (66.7 kg)  08/16/20 146 lb 11.2 oz (66.5 kg)  07/12/20 150 lb (68 kg)   BP Readings from Last 3 Encounters:  09/02/20 122/68  08/16/20 (!) 147/79  07/12/20 126/74         Past Medical History:  Diagnosis Date  . Allergic rhinitis 12/16/2016  . Arthritis    DJD-right knee oa arms  . Atrial tachycardia, paroxysmal ([place])   . Bladder cancer [facility]) urologist- dr Tammi Klippel   now recurrent  w/ hx high grade superficial carcinoma in 2015 s/p TURBT and chemo instillation  . CAD (coronary artery disease)    non obstructive, [name], april 2010  . Depression   . Full dentures   . History of iron deficiency anemia   . Hyperlipidemia   . Hypertension   . Spider bite 08/14/2020   PT MASHED AREA AND STUFF CAME OUT USING NEOSPOIRN 1 X DAY TO LEFT THIGH  . Wears dentures    full  . Wears glasses    Past Surgical History:  Procedure Laterality Date  . ABDOMINAL HYSTERECTOMY  1970's   w/ bilateral salpingoophorectomy complete  . CARDIAC CATHETERIZATION  08-22-2008  dr Cathie Olden   mild to moderate coronary artery irregularies/  dRCA 30%/  ef 60%/  possible coronary spasm  . CARDIOVASCULAR STRESS TEST  08-15-2008  dr Cathie Olden   abnormal stress dual isotope/  BP drop following exercise is concern/ no evidence ischenmia/ LV function normal/ ef 74%  . CATARACT EXTRACTION W/ INTRAOCULAR LENS  IMPLANT, BILATERAL  2013  . CYSTOSCOPY W/ RETROGRADES Bilateral 02/21/2014   Procedure: CYSTOSCOPY WITH RETROGRADE PYELOGRAM, Bilateral ;  Surgeon: [name], MD;  Location: [facility];  Service: Urology;  Laterality: Bilateral;  . CYSTOSCOPY W/ RETROGRADES Bilateral 01/01/2016   Procedure: CYSTOSCOPY WITH RETROGRADE  PYELOGRAM;  Surgeon: [name], MD;  Location: [facility];  Service: Urology;  Laterality: Bilateral;  . CYSTOSCOPY W/ RETROGRADES Left 08/27/2017   Procedure: BLUE LIGHT CYSTOSCOPY WITH CYSCVIEW WITH LEFT RETROGRADE [id number];  Surgeon: [name], MD;  [facility];  Service: Urology;  Laterality: Left;  . CYSTOSCOPY W/ RETROGRADES Bilateral 08/16/2020   Procedure: CYSTOSCOPY WITH RETROGRADE PYELOGRAMS AND EXAM UNDER ANESTHESIA;  Surgeon: [name], MD;  Location: [facility];  Service: Urology;  Laterality: Bilateral;  . CYSTOSCOPY W/ URETERAL STENT PLACEMENT Bilateral 01/04/2014   Procedure: CYSTOSCOPY WITH bilateral RETROGRADE PYELOGRAM;  Surgeon: [name], MD;  Location: [facility];  Service: Urology;  Laterality: Bilateral;  . PARTIAL KNEE ARTHROPLASTY Right 06/11/2016   Procedure: RIGHT UNICOMPARTMENTAL KNEE ARTHROPLASTY;  Surgeon: Leandrew Koyanagi, MD;  Location: [place];  Service: Orthopedics;  Laterality: Right;  . TRANSURETHRAL RESECTION OF BLADDER TUMOR N/A 01/01/2016   Procedure: TRANSURETHRAL RESECTION OF BLADDER TUMOR (TURBT) WITH MITOMYCIN;  Surgeon: [name], MD;  Location: [facility];  Service: Urology;  Laterality: N/A;  . TRANSURETHRAL RESECTION OF BLADDER TUMOR N/A 08/27/2017   Procedure: TRANSURETHRAL RESECTION OF BLADDER TUMOR (TURBT);  Surgeon: [name], MD;  [facility];  Service: Urology;  Laterality: N/A;  .  TRANSURETHRAL RESECTION OF BLADDER TUMOR N/A 08/16/2020   Procedure: TRANSURETHRAL RESECTION OF BLADDER TUMOR (TURBT);  Surgeon: [name], MD;  Location: [facility];  Service: Urology;  Laterality: N/A;  . TRANSURETHRAL RESECTION OF BLADDER TUMOR WITH GYRUS (TURBT-GYRUS) N/A 01/04/2014   Procedure: TRANSURETHRAL RESECTION OF BLADDER TUMOR WITH GYRUS (TURBT-GYRUS);  Surgeon: [name], MD;  Location: [facility];  Service: Urology;   Laterality: N/A;  . TRANSURETHRAL RESECTION OF BLADDER TUMOR WITH GYRUS (TURBT-GYRUS) N/A 02/21/2014   Procedure: TRANSURETHRAL RESECTION OF BLADDER TUMOR WITH GYRUS (TURBT-GYRUS);  Surgeon: [name], MD;  Location: [facility];  Service: Urology;  Laterality: N/A;    reports that she has never smoked. She has never used smokeless tobacco. She reports current alcohol use. She reports that she does not use drugs. family history includes Cancer in her mother; Hypertension in her father. Allergies  Allergen Reactions  . Codeine Nausea And Vomiting  . Penicillins Nausea And Vomiting    Unknown childhood reaction Has patient had a PCN reaction causing immediate rash, facial/tongue/throat swelling, SOB or lightheadedness with hypotension: Unknown Has patient had a PCN reaction causing severe rash involving mucus membranes or skin necrosis: Unknown Has patient had a PCN reaction that required hospitalization: Unknown Has patient had a PCN reaction occurring within the last 10 years: No If all of the above answers are "NO", then may proceed with Cephalosporin use.    Current Outpatient Medications on File Prior to Visit  Medication Sig Dispense Refill  . Acetaminophen (TYLENOL ARTHRITIS PAIN PO) Take by mouth. 2 tabs prn    . amLODipine (NORVASC) 5 MG tablet Take 1 tablet by mouth once daily (Patient taking differently: at bedtime. Take 1 tablet by mouth once daily) 90 tablet 3  . atorvastatin (LIPITOR) 20 MG tablet Take 1 tablet (20 mg total) by mouth daily. (Patient taking differently: Take 20 mg by mouth at bedtime.) 90 tablet 3  . Cholecalciferol (VITAMIN D3) 2000 units TABS Take 2,000 Units by mouth daily.     . ferrous sulfate 325 (65 FE) MG tablet Take 1 tablet (325 mg total) by mouth daily with breakfast. 90 tablet 3  . irbesartan (AVAPRO) 300 MG tablet Take 1 tablet (300 mg total) by mouth daily. (Patient taking differently: Take 300 mg by mouth at bedtime.) 90 tablet 3   . metoprolol tartrate (LOPRESSOR) 25 MG tablet 1 by mouth twice per day (Patient taking differently: 2 (two) times daily.) 180 tablet 3  . Omega-3 Fatty Acids (FISH OIL) 1000 MG CAPS Take 1,000 mg by mouth daily.    Marland Kitchen oxybutynin (DITROPAN) 5 MG tablet Take 5 mg by mouth every 8 (eight) hours as needed for bladder spasms.    Marland Kitchen QUEtiapine (SEROQUEL) 50 MG tablet Take 1 tablet (50 mg total) by mouth at bedtime as needed. (Patient taking differently: Take 50 mg by mouth at bedtime as needed. Takes 1/4 of pill qhs prn) 90 tablet 3  . traMADol (ULTRAM) 50 MG tablet Take 1 tablet (50 mg total) by mouth every 6 (six) hours as needed for moderate pain or severe pain. Post-operatively 15 tablet 0  . vitamin B-12 (CYANOCOBALAMIN) 1000 MCG tablet Take 1,000 mcg by mouth daily.     No current facility-administered medications on file prior to visit.        ROS:  All others reviewed and negative.  Objective        PE:  BP 122/68 (BP Location: Right Arm, Patient Position:  Sitting, Cuff Size: Normal)   Pulse 67   Temp 98.7 F (37.1 C) (Oral)   Ht 5\' 6"  (1.676 m)   Wt 147 lb (66.7 kg)   SpO2 100%   BMI 23.73 kg/m                 Constitutional: Pt appears in NAD               HENT: Head: NCAT.                Right Ear: External ear normal.                 Left Ear: External ear normal.                Eyes: . Pupils are equal, round, and reactive to light. Conjunctivae and EOM are normal               Nose: without d/c or deformity               Neck: Neck supple. Gross normal ROM               Cardiovascular: Normal rate and regular rhythm.                 Pulmonary/Chest: Effort normal and breath sounds without rales or wheezing.                Abd:  Soft, NT, ND, + BS, no organomegaly               Neurological: Pt is alert. At baseline orientation, motor grossly intact               Skin: Skin is warm. No rashes, no other new lesions, LE edema - ***               Psychiatric: Pt behavior is  normal without agitation   Micro: none  Cardiac tracings I have personally interpreted today:  none  Pertinent Radiological findings (summarize): none   Lab Results  Component Value Date   WBC 11.2 (H) 06/04/2020   HGB 12.2 08/16/2020   HCT 36.0 08/16/2020   PLT 359.0 06/04/2020   GLUCOSE 122 (H) 08/16/2020   CHOL 164 06/04/2020   TRIG 133.0 06/04/2020   HDL 35.60 (L) 06/04/2020   LDLDIRECT 94.1 06/01/2012   LDLCALC 102 (H) 06/04/2020   ALT 17 06/04/2020   AST 51 (H) 06/04/2020   NA 141 08/16/2020   K 4.4 08/16/2020   CL 104 08/16/2020   CREATININE 0.80 08/16/2020   BUN 17 08/16/2020   CO2 27 06/04/2020   TSH 1.60 06/04/2020   INR 1.00 06/04/2016   HGBA1C 6.6 (H) 06/04/2020   MICROALBUR 6.8 (H) 06/04/2020   Assessment/Plan:  Rhonda Norman is a 87 y.o. White or Caucasian [1] female with  has a past medical history of Allergic rhinitis (12/16/2016), Arthritis, Atrial tachycardia, paroxysmal ([place]), Bladder cancer [facility]) (urologist- dr Tammi Klippel), CAD (coronary artery disease), Depression, Full dentures, History of iron deficiency anemia, Hyperlipidemia, Hypertension, Spider bite (08/14/2020), Wears dentures, and Wears glasses.  No problem-specific Assessment & Plan notes found for this encounter.  Followup: No follow-ups on file.  Cathlean Cower, MD 09/02/2020 2:42 PM [facility]

## 2020-09-03 ENCOUNTER — Encounter: Payer: Self-pay | Admitting: Internal Medicine

## 2020-09-03 ENCOUNTER — Other Ambulatory Visit: Payer: Self-pay | Admitting: Internal Medicine

## 2020-09-03 DIAGNOSIS — R911 Solitary pulmonary nodule: Secondary | ICD-10-CM

## 2020-09-03 LAB — URINE CULTURE

## 2020-09-05 ENCOUNTER — Telehealth: Payer: Self-pay | Admitting: Internal Medicine

## 2020-09-05 NOTE — Telephone Encounter (Signed)
Results reviewed with patient in detail. 

## 2020-09-05 NOTE — Telephone Encounter (Signed)
    Please call patient to discuss chest xray results. She has been contacted by [facility] to schedule CT scan, but she wants to discuss xray results first   Please call

## 2020-09-08 NOTE — Progress Notes (Signed)
Patient ID: CHANIN FRUMKIN, female   DOB: [birth year], 87 y.o.   MRN: [id number]         Chief Complaint:: wellness exam and Office Visit (Fluid in ears, lethargic)  , allergies, wt loss       HPI:  PANSY OSTROVSKY is a 87 y.o. female here for wellness exam; plans to call for optho exam soon, o/w up to date with preventive referrals and immunizations.  Recent labs already done mar 2022                        Alson s/p recent bladder tumor removed, Denies urinary symptoms such as dysuria, frequency, urgency, flank pain, hematuria or n/v, fever, chills.  Has had significant wt loss, overall down from 171 in oct 2019, gradual,  Pt denies fever, night sweats, loss of appetite, or other constitutional symptoms  Today c/o Does have several wks ongoing nasal allergy symptoms with clearish congestion, itch and sneezing, without fever, pain, ST, cough, swelling or wheezing. But has fatigue, bilateral ear pressure and intermittent mild dizziness, left > right.  Pt denies chest pain, increased sob or doe, wheezing, orthopnea, PND, increased LE swelling, palpitations, or syncope.   Pt denies polydipsia, polyuria, or new focal neuro s/s    Wt Readings from Last 3 Encounters:  09/02/20 147 lb (66.7 kg)  08/16/20 146 lb 11.2 oz (66.5 kg)  07/12/20 150 lb (68 kg)   BP Readings from Last 3 Encounters:  09/02/20 122/68  08/16/20 (!) 147/79  07/12/20 126/74   Immunization History  Administered Date(s) Administered  . PFIZER(Purple Top)SARS-COV-2 Vaccination 05/26/2019, 06/16/2019, 01/03/2020, 06/18/2020  . Pneumococcal Conjugate-13 06/10/2011, 06/29/2013  . Td 05/28/2005   Health Maintenance Due  Topic Date Due  . OPHTHALMOLOGY EXAM  07/26/2016      Past Medical History:  Diagnosis Date  . Allergic rhinitis 12/16/2016  . Arthritis    DJD-right knee oa arms  . Atrial tachycardia, paroxysmal ([place])   . Bladder cancer [facility]) urologist- dr Tammi Klippel   now recurrent  w/ hx high grade superficial  carcinoma in 2015 s/p TURBT and chemo instillation  . CAD (coronary artery disease)    non obstructive, [name], april 2010  . Depression   . Full dentures   . History of iron deficiency anemia   . Hyperlipidemia   . Hypertension   . Spider bite 08/14/2020   PT MASHED AREA AND STUFF CAME OUT USING NEOSPOIRN 1 X DAY TO LEFT THIGH  . Wears dentures    full  . Wears glasses    Past Surgical History:  Procedure Laterality Date  . ABDOMINAL HYSTERECTOMY  1970's   w/ bilateral salpingoophorectomy complete  . CARDIAC CATHETERIZATION  08-22-2008  dr Cathie Olden   mild to moderate coronary artery irregularies/  dRCA 30%/  ef 60%/  possible coronary spasm  . CARDIOVASCULAR STRESS TEST  08-15-2008  dr Cathie Olden   abnormal stress dual isotope/  BP drop following exercise is concern/ no evidence ischenmia/ LV function normal/ ef 74%  . CATARACT EXTRACTION W/ INTRAOCULAR LENS  IMPLANT, BILATERAL  2013  . CYSTOSCOPY W/ RETROGRADES Bilateral 02/21/2014   Procedure: CYSTOSCOPY WITH RETROGRADE PYELOGRAM, Bilateral ;  Surgeon: [name], MD;  Location: [facility];  Service: Urology;  Laterality: Bilateral;  . CYSTOSCOPY W/ RETROGRADES Bilateral 01/01/2016   Procedure: CYSTOSCOPY WITH RETROGRADE PYELOGRAM;  Surgeon: [name], MD;  Location: [facility];  Service: Urology;  Laterality: Bilateral;  .  CYSTOSCOPY W/ RETROGRADES Left 08/27/2017   Procedure: BLUE LIGHT CYSTOSCOPY WITH CYSCVIEW WITH LEFT RETROGRADE DUKGURKY;  Surgeon: [name], MD;  Location: [facility];  Service: Urology;  Laterality: Left;  . CYSTOSCOPY W/ RETROGRADES Bilateral 08/16/2020   Procedure: CYSTOSCOPY WITH RETROGRADE PYELOGRAMS AND EXAM UNDER ANESTHESIA;  Surgeon: [name], MD;  Location: [facility];  Service: Urology;  Laterality: Bilateral;  . CYSTOSCOPY W/ URETERAL STENT PLACEMENT Bilateral 01/04/2014   Procedure: CYSTOSCOPY WITH bilateral RETROGRADE PYELOGRAM;  Surgeon:  [name], MD;  Location: [facility];  Service: Urology;  Laterality: Bilateral;  . PARTIAL KNEE ARTHROPLASTY Right 06/11/2016   Procedure: RIGHT UNICOMPARTMENTAL KNEE ARTHROPLASTY;  Surgeon: Leandrew Koyanagi, MD;  Location: [place];  Service: Orthopedics;  Laterality: Right;  . TRANSURETHRAL RESECTION OF BLADDER TUMOR N/A 01/01/2016   Procedure: TRANSURETHRAL RESECTION OF BLADDER TUMOR (TURBT) WITH MITOMYCIN;  Surgeon: [name], MD;  Location: [facility];  Service: Urology;  Laterality: N/A;  . TRANSURETHRAL RESECTION OF BLADDER TUMOR N/A 08/27/2017   Procedure: TRANSURETHRAL RESECTION OF BLADDER TUMOR (TURBT);  Surgeon: [name], MD;  Location: [facility];  Service: Urology;  Laterality: N/A;  . TRANSURETHRAL RESECTION OF BLADDER TUMOR N/A 08/16/2020   Procedure: TRANSURETHRAL RESECTION OF BLADDER TUMOR (TURBT);  Surgeon: [name], MD;  Location: [facility];  Service: Urology;  Laterality: N/A;  . TRANSURETHRAL RESECTION OF BLADDER TUMOR WITH GYRUS (TURBT-GYRUS) N/A 01/04/2014   Procedure: TRANSURETHRAL RESECTION OF BLADDER TUMOR WITH GYRUS (TURBT-GYRUS);  Surgeon: [name], MD;  Location: [facility];  Service: Urology;  Laterality: N/A;  . TRANSURETHRAL RESECTION OF BLADDER TUMOR WITH GYRUS (TURBT-GYRUS) N/A 02/21/2014   Procedure: TRANSURETHRAL RESECTION OF BLADDER TUMOR WITH GYRUS (TURBT-GYRUS);  Surgeon: [name], MD;  Location: [facility];  Service: Urology;  Laterality: N/A;    reports that she has never smoked. She has never used smokeless tobacco. She reports current alcohol use. She reports that she does not use drugs. family history includes Cancer in her mother; Hypertension in her father. Allergies  Allergen Reactions  . Codeine Nausea And Vomiting  . Penicillins Nausea And Vomiting    Unknown childhood reaction Has patient had a PCN reaction causing immediate rash,  facial/tongue/throat swelling, SOB or lightheadedness with hypotension: Unknown Has patient had a PCN reaction causing severe rash involving mucus membranes or skin necrosis: Unknown Has patient had a PCN reaction that required hospitalization: Unknown Has patient had a PCN reaction occurring within the last 10 years: No If all of the above answers are "NO", then may proceed with Cephalosporin use.    Current Outpatient Medications on File Prior to Visit  Medication Sig Dispense Refill  . Acetaminophen (TYLENOL ARTHRITIS PAIN PO) Take by mouth. 2 tabs prn    . amLODipine (NORVASC) 5 MG tablet Take 1 tablet by mouth once daily (Patient taking differently: at bedtime. Take 1 tablet by mouth once daily) 90 tablet 3  . atorvastatin (LIPITOR) 20 MG tablet Take 1 tablet (20 mg total) by mouth daily. (Patient taking differently: Take 20 mg by mouth at bedtime.) 90 tablet 3  . Cholecalciferol (VITAMIN D3) 2000 units TABS Take 2,000 Units by mouth daily.     . ferrous sulfate 325 (65 FE) MG tablet Take 1 tablet (325 mg total) by mouth daily with breakfast. 90 tablet 3  . irbesartan (AVAPRO) 300 MG tablet Take 1 tablet (300 mg total) by mouth daily. (Patient taking differently: Take 300 mg  by mouth at bedtime.) 90 tablet 3  . metoprolol tartrate (LOPRESSOR) 25 MG tablet 1 by mouth twice per day (Patient taking differently: 2 (two) times daily.) 180 tablet 3  . Omega-3 Fatty Acids (FISH OIL) 1000 MG CAPS Take 1,000 mg by mouth daily.    Marland Kitchen oxybutynin (DITROPAN) 5 MG tablet Take 5 mg by mouth every 8 (eight) hours as needed for bladder spasms.    Marland Kitchen QUEtiapine (SEROQUEL) 50 MG tablet Take 1 tablet (50 mg total) by mouth at bedtime as needed. (Patient taking differently: Take 50 mg by mouth at bedtime as needed. Takes 1/4 of pill qhs prn) 90 tablet 3  . traMADol (ULTRAM) 50 MG tablet Take 1 tablet (50 mg total) by mouth every 6 (six) hours as needed for moderate pain or severe pain. Post-operatively 15 tablet 0   . vitamin B-12 (CYANOCOBALAMIN) 1000 MCG tablet Take 1,000 mcg by mouth daily.     No current facility-administered medications on file prior to visit.        ROS:  All others reviewed and negative.  Objective        PE:  BP 122/68 (BP Location: Right Arm, Patient Position: Sitting, Cuff Size: Normal)   Pulse 67   Temp 98.7 F (37.1 C) (Oral)   Ht 5\' 6"  (1.676 m)   Wt 147 lb (66.7 kg)   SpO2 100%   BMI 23.73 kg/m                 Constitutional: Pt appears in NAD               HENT: Head: NCAT.                Right Ear: External ear normal.                 Left Ear: External ear normal.                Eyes: . Pupils are equal, round, and reactive to light. Conjunctivae and EOM are normal. right tm's with mild erythema, left with severe redness, bulging.  Max sinus areas non tender.  Pharynx with mild erythema, no exudate               Nose: without d/c or deformity               Neck: Neck supple. Gross normal ROM               Cardiovascular: Normal rate and regular rhythm.                 Pulmonary/Chest: Effort normal and breath sounds without rales or wheezing.                Abd:  Soft, NT, ND, + BS, no organomegaly               Neurological: Pt is alert. At baseline orientation, motor grossly intact               Skin: Skin is warm. No rashes, no other new lesions, LE edema - none               Psychiatric: Pt behavior is normal without agitation   Micro: none  Cardiac tracings I have personally interpreted today:  none  Pertinent Radiological findings (summarize): none   Lab Results  Component Value Date   WBC 9.9 09/02/2020   HGB 10.0 (L) 09/02/2020   HCT 30.9 (  L) 09/02/2020   PLT 355.0 09/02/2020   GLUCOSE 142 (H) 09/02/2020   CHOL 160 09/02/2020   TRIG 127.0 09/02/2020   HDL 43.40 09/02/2020   LDLDIRECT 94.1 06/01/2012   LDLCALC 91 09/02/2020   ALT 14 09/02/2020   AST 25 09/02/2020   NA 138 09/02/2020   K 3.9 09/02/2020   CL 102 09/02/2020    CREATININE 0.70 09/02/2020   BUN 16 09/02/2020   CO2 25 09/02/2020   TSH 1.92 09/02/2020   INR 1.00 06/04/2016   HGBA1C 6.7 (H) 09/02/2020   MICROALBUR 6.8 (H) 06/04/2020   Assessment/Plan:  BEYONCE LAGO is a 87 y.o. White or Caucasian [1] female with  has a past medical history of Allergic rhinitis (12/16/2016), Arthritis, Atrial tachycardia, paroxysmal ([place]), Bladder cancer [facility]) (urologist- dr Tammi Klippel), CAD (coronary artery disease), Depression, Full dentures, History of iron deficiency anemia, Hyperlipidemia, Hypertension, Spider bite (08/14/2020), Wears dentures, and Wears glasses.  Encounter for well adult exam with abnormal findings Age and sex appropriate education and counseling updated with regular exercise and diet Referrals for preventative services - pt to call for eye exam Immunizations addressed - none needed Smoking counseling  - none needed Evidence for depression or other mood disorder - none significant Most recent labs reviewed. I have personally reviewed and have noted: 1) the patient's medical and social history 2) The patient's current medications and supplements 3) The patient's height, weight, and BMI have been recorded in the chart   Weight loss ? Geriatric declines vs other, for cxr and labs as ordered  Vertigo Mild, likely peripheral in nature, for meclizine prn,  to f/u any worsening symptoms or concerns  Left otitis media Mild to mod, for antibx course,  to f/u any worsening symptoms or concerns  Diabetes Lab Results  Component Value Date   HGBA1C 6.7 (H) 09/02/2020   Stable, pt to continue current medical treatment - diet   Allergic rhinitis Mild to mod, for depomedrol imm 80, predpac asd,  to f/u any worsening symptoms or concerns  Followup: Return in about 6 months (around 03/05/2021).  Cathlean Cower, MD 09/10/2020 6:04 AM [facility]

## 2020-09-10 ENCOUNTER — Encounter: Payer: Self-pay | Admitting: Internal Medicine

## 2020-09-10 NOTE — Assessment & Plan Note (Signed)
Mild to mod, for antibx course,  to f/u any worsening symptoms or concerns 

## 2020-09-10 NOTE — Assessment & Plan Note (Signed)
Lab Results  Component Value Date   HGBA1C 6.7 (H) 09/02/2020   Stable, pt to continue current medical treatment - diet

## 2020-09-10 NOTE — Assessment & Plan Note (Signed)
?   Geriatric declines vs other, for cxr and labs as ordered

## 2020-09-10 NOTE — Assessment & Plan Note (Signed)
Mild to mod, for depomedrol imm 80, predpac asd,  to f/u any worsening symptoms or concerns

## 2020-09-10 NOTE — Assessment & Plan Note (Signed)
Mild, likely peripheral in nature, for meclizine prn,  to f/u any worsening symptoms or concerns

## 2020-09-10 NOTE — Assessment & Plan Note (Signed)
Age and sex appropriate education and counseling updated with regular exercise and diet Referrals for preventative services - pt to call for eye exam Immunizations addressed - none needed Smoking counseling  - none needed Evidence for depression or other mood disorder - none significant Most recent labs reviewed. I have personally reviewed and have noted: 1) the patient's medical and social history 2) The patient's current medications and supplements 3) The patient's height, weight, and BMI have been recorded in the chart

## 2020-09-18 ENCOUNTER — Ambulatory Visit
Admission: RE | Admit: 2020-09-18 | Discharge: 2020-09-18 | Disposition: A | Discharge status: Home / Self Care | Payer: Medicare Other | Source: Ambulatory Visit | Attending: Internal Medicine | Admitting: Internal Medicine

## 2020-09-18 DIAGNOSIS — R918 Other nonspecific abnormal finding of lung field: Secondary | ICD-10-CM | POA: Diagnosis not present

## 2020-09-18 DIAGNOSIS — R911 Solitary pulmonary nodule: Secondary | ICD-10-CM

## 2020-09-20 ENCOUNTER — Telehealth: Payer: Self-pay | Admitting: Internal Medicine

## 2020-09-20 ENCOUNTER — Ambulatory Visit: Payer: Medicare Other | Admitting: Internal Medicine

## 2020-09-20 DIAGNOSIS — C7800 Secondary malignant neoplasm of unspecified lung: Secondary | ICD-10-CM

## 2020-09-20 NOTE — Telephone Encounter (Signed)
none

## 2020-09-20 NOTE — Telephone Encounter (Signed)
No answer home

## 2020-09-20 NOTE — Telephone Encounter (Signed)
Patients daughter called back. She can be reached at [phone]

## 2020-09-20 NOTE — Addendum Note (Signed)
Addended by: Biagio Borg on: 09/20/2020 10:33 AM   Modules accepted: Orders

## 2020-09-20 NOTE — Telephone Encounter (Signed)
I didn't realize pt has appt later today  I spoke with daughter (pt just has moved in with daughter last wk due to infirmity) Sylvester Harder who plans to let pt know about her CT scan results (she state pt would be ok with this)  CT chest c/w metastatic malignancy of unknown primary  [place] for referral urgent to Oncology  Ok to cancel appt later today

## 2020-09-24 ENCOUNTER — Telehealth: Payer: Self-pay | Admitting: Oncology

## 2020-09-24 NOTE — Telephone Encounter (Signed)
Received a new pt referral from Dr. Jenny Reichmann for metastatic bladder cancer. Ms. Rhonda Norman has been scheduled to see Dr. Alen Blew on 6/1 at 2pm. Appt date and time has been given to the pt's daughter. Aware to arrive 30 minutes early.

## 2020-09-25 ENCOUNTER — Other Ambulatory Visit: Payer: Self-pay

## 2020-09-25 ENCOUNTER — Inpatient Hospital Stay: Payer: Medicare Other | Attending: Oncology | Admitting: Oncology

## 2020-09-25 VITALS — BP 141/68 | HR 83 | Temp 97.5°F | Resp 18 | Ht 66.0 in | Wt 142.7 lb

## 2020-09-25 DIAGNOSIS — K769 Liver disease, unspecified: Secondary | ICD-10-CM

## 2020-09-25 DIAGNOSIS — C670 Malignant neoplasm of trigone of bladder: Secondary | ICD-10-CM

## 2020-09-25 DIAGNOSIS — R918 Other nonspecific abnormal finding of lung field: Secondary | ICD-10-CM

## 2020-09-25 DIAGNOSIS — Z809 Family history of malignant neoplasm, unspecified: Secondary | ICD-10-CM

## 2020-09-25 NOTE — Progress Notes (Signed)
Reason for the request:    Advanced malignancy   HPI: I was asked by Dr. Jenny Reichmann to evaluate Rhonda Norman for metastatic cancer.  She is an 87 year old woman with history of bladder cancer that dates back to 72.  At that time she was found to have Ta disease and received full BCG induction.  She had a recurrence in 2017 and received Mitomycin-C.  She has been followed by cystoscopy under the care of Dr. Tammi Klippel and received TURBT in 2019 had no recurrence till March 2022.  At that time she was found to have a 1 cm right trigone tumor.  On August 16, 2020 she underwent cystoscopy and transurethral resection of a bladder tumor with small volume.  It was noted that she had a sessile but small bladder tumor 2 cm in the anterior trigone posterior wall.  He had bilateral retrograde evaluation.  The final pathology showed high-grade urothelial carcinoma with glandular differentiation of 90%.  The tumor found to be likely invasive with suspicious with suspicious muscularis propria involvement.    Patient is a reported weakness and lethargy and close to 10 pound weight loss reported to Dr. Jenny Reichmann.  Chest x-ray obtained on Sep 02, 2020 which showed an intermediate nodular density in both upper lobes and a CT scan was recommended.  CT scan on Sep 18, 2020 was reviewed and showed multiple bilateral pulmonary nodules consistent with metastatic disease.  Extensive hepatic metastasis was also noted.  Based on these findings she was referred for evaluation.  Clinically, she reports no major complaints at this time.  She does report some mild dizziness and some slight fatigue but overall no abdominal pain or discomfort.  No hematuria or dysuria.   She does not report any headaches, blurry vision, syncope or seizures. Does not report any fevers, chills or sweats.  Does not report any cough, wheezing or hemoptysis.  Does not report any chest pain, palpitation, orthopnea or leg edema.  Does not report any nausea, vomiting or  abdominal pain.  Does not report any constipation or diarrhea.  Does not report any skeletal complaints.    Does not report frequency, urgency or hematuria.  Does not report any skin rashes or lesions. Does not report any heat or cold intolerance.  Does not report any lymphadenopathy or petechiae.  Does not report any anxiety or depression.  Remaining review of systems is negative.    Past Medical History:  Diagnosis Date  . Allergic rhinitis 12/16/2016  . Arthritis    DJD-right knee oa arms  . Atrial tachycardia, paroxysmal ([place])   . Bladder cancer [facility]) urologist- dr Tammi Klippel   now recurrent  w/ hx high grade superficial carcinoma in 2015 s/p TURBT and chemo instillation  . CAD (coronary artery disease)    non obstructive, [name], april 2010  . Depression   . Full dentures   . History of iron deficiency anemia   . Hyperlipidemia   . Hypertension   . Spider bite 08/14/2020   PT MASHED AREA AND STUFF CAME OUT USING NEOSPOIRN 1 X DAY TO LEFT THIGH  . Wears dentures    full  . Wears glasses   :  Past Surgical History:  Procedure Laterality Date  . ABDOMINAL HYSTERECTOMY  1970's   w/ bilateral salpingoophorectomy complete  . CARDIAC CATHETERIZATION  08-22-2008  dr Cathie Olden   mild to moderate coronary artery irregularies/  dRCA 30%/  ef 60%/  possible coronary spasm  . CARDIOVASCULAR STRESS TEST  08-15-2008  dr Cathie Olden   abnormal stress dual isotope/  BP drop following exercise is concern/ no evidence ischenmia/ LV function normal/ ef 74%  . CATARACT EXTRACTION W/ INTRAOCULAR LENS  IMPLANT, BILATERAL  2013  . CYSTOSCOPY W/ RETROGRADES Bilateral 02/21/2014   Procedure: CYSTOSCOPY WITH RETROGRADE PYELOGRAM, Bilateral ;  Surgeon: [name], MD;  Location: [facility];  Service: Urology;  Laterality: Bilateral;  . CYSTOSCOPY W/ RETROGRADES Bilateral 01/01/2016   Procedure: CYSTOSCOPY WITH RETROGRADE PYELOGRAM;  Surgeon: [name], MD;  Location: [facility];  Service: Urology;  Laterality: Bilateral;  . CYSTOSCOPY W/ RETROGRADES Left 08/27/2017   Procedure: BLUE LIGHT CYSTOSCOPY WITH CYSCVIEW WITH LEFT RETROGRADE WIOXBDZH;  Surgeon: [name], MD;  Location: [facility];  Service: Urology;  Laterality: Left;  . CYSTOSCOPY W/ RETROGRADES Bilateral 08/16/2020   Procedure: CYSTOSCOPY WITH RETROGRADE PYELOGRAMS AND EXAM UNDER ANESTHESIA;  Surgeon: [name], MD;  Location: [facility];  Service: Urology;  Laterality: Bilateral;  . CYSTOSCOPY W/ URETERAL STENT PLACEMENT Bilateral 01/04/2014   Procedure: CYSTOSCOPY WITH bilateral RETROGRADE PYELOGRAM;  Surgeon: [name], MD;  Location: [facility];  Service: Urology;  Laterality: Bilateral;  . PARTIAL KNEE ARTHROPLASTY Right 06/11/2016   Procedure: RIGHT UNICOMPARTMENTAL KNEE ARTHROPLASTY;  Surgeon: Leandrew Koyanagi, MD;  Location: [place];  Service: Orthopedics;  Laterality: Right;  . TRANSURETHRAL RESECTION OF BLADDER TUMOR N/A 01/01/2016   Procedure: TRANSURETHRAL RESECTION OF BLADDER TUMOR (TURBT) WITH MITOMYCIN;  Surgeon: [name], MD;  Location: [facility];  Service: Urology;  Laterality: N/A;  . TRANSURETHRAL RESECTION OF BLADDER TUMOR N/A 08/27/2017   Procedure: TRANSURETHRAL RESECTION OF BLADDER TUMOR (TURBT);  Surgeon: [name], MD;  Location: [facility];  Service: Urology;  Laterality: N/A;  . TRANSURETHRAL RESECTION OF BLADDER TUMOR N/A 08/16/2020   Procedure: TRANSURETHRAL RESECTION OF BLADDER TUMOR (TURBT);  Surgeon: [name], MD;  Location: [facility];  Service: Urology;  Laterality: N/A;  . TRANSURETHRAL RESECTION OF BLADDER TUMOR WITH GYRUS (TURBT-GYRUS) N/A 01/04/2014   Procedure: TRANSURETHRAL RESECTION OF BLADDER TUMOR WITH GYRUS (TURBT-GYRUS);  Surgeon: [name], MD;  Location: [facility];  Service: Urology;  Laterality: N/A;  . TRANSURETHRAL RESECTION OF BLADDER TUMOR WITH GYRUS  (TURBT-GYRUS) N/A 02/21/2014   Procedure: TRANSURETHRAL RESECTION OF BLADDER TUMOR WITH GYRUS (TURBT-GYRUS);  Surgeon: [name], MD;  Location: [facility];  Service: Urology;  Laterality: N/A;  :   Current Outpatient Medications:  .  Acetaminophen (TYLENOL ARTHRITIS PAIN PO), Take by mouth. 2 tabs prn, Disp: , Rfl:  .  amLODipine (NORVASC) 5 MG tablet, Take 1 tablet by mouth once daily (Patient taking differently: at bedtime. Take 1 tablet by mouth once daily), Disp: 90 tablet, Rfl: 3 .  atorvastatin (LIPITOR) 20 MG tablet, Take 1 tablet (20 mg total) by mouth daily. (Patient taking differently: Take 20 mg by mouth at bedtime.), Disp: 90 tablet, Rfl: 3 .  Cholecalciferol (VITAMIN D3) 2000 units TABS, Take 2,000 Units by mouth daily. , Disp: , Rfl:  .  doxycycline (VIBRA-TABS) 100 MG tablet, Take 1 tablet (100 mg total) by mouth 2 (two) times daily., Disp: 29 tablet, Rfl: 0 .  ferrous sulfate 325 (65 FE) MG tablet, Take 1 tablet (325 mg total) by mouth daily with breakfast., Disp: 90 tablet, Rfl: 3 .  irbesartan (AVAPRO) 300 MG tablet, Take 1 tablet (300 mg total) by mouth daily. (Patient taking differently: Take 300 mg by mouth at bedtime.),  Disp: 90 tablet, Rfl: 3 .  meclizine (ANTIVERT) 12.5 MG tablet, Take 1 tablet (12.5 mg total) by mouth 3 (three) times daily as needed for dizziness., Disp: 30 tablet, Rfl: 1 .  metoprolol tartrate (LOPRESSOR) 25 MG tablet, 1 by mouth twice per day (Patient taking differently: 2 (two) times daily.), Disp: 180 tablet, Rfl: 3 .  Omega-3 Fatty Acids (FISH OIL) 1000 MG CAPS, Take 1,000 mg by mouth daily., Disp: , Rfl:  .  oxybutynin (DITROPAN) 5 MG tablet, Take 5 mg by mouth every 8 (eight) hours as needed for bladder spasms., Disp: , Rfl:  .  predniSONE (DELTASONE) 10 MG tablet, 3 tabs by mouth per day for 3 days,2tabs per day for 3 days,1tab per day for 3 days, Disp: 18 tablet, Rfl: 0 .  QUEtiapine (SEROQUEL) 50 MG tablet, Take 1 tablet (50  mg total) by mouth at bedtime as needed. (Patient taking differently: Take 50 mg by mouth at bedtime as needed. Takes 1/4 of pill qhs prn), Disp: 90 tablet, Rfl: 3 .  traMADol (ULTRAM) 50 MG tablet, Take 1 tablet (50 mg total) by mouth every 6 (six) hours as needed for moderate pain or severe pain. Post-operatively, Disp: 15 tablet, Rfl: 0 .  vitamin B-12 (CYANOCOBALAMIN) 1000 MCG tablet, Take 1,000 mcg by mouth daily., Disp: , Rfl: :  Allergies  Allergen Reactions  . Codeine Nausea And Vomiting  . Penicillins Nausea And Vomiting    Unknown childhood reaction Has patient had a PCN reaction causing immediate rash, facial/tongue/throat swelling, SOB or lightheadedness with hypotension: Unknown Has patient had a PCN reaction causing severe rash involving mucus membranes or skin necrosis: Unknown Has patient had a PCN reaction that required hospitalization: Unknown Has patient had a PCN reaction occurring within the last 10 years: No If all of the above answers are "NO", then may proceed with Cephalosporin use.   :  Family History  Problem Relation Age of Onset  . Cancer Mother   . Hypertension Father   :  Social History   Socioeconomic History  . Marital status: Widowed    Spouse name: Not on file  . Number of children: Not on file  . Years of education: Not on file  . Highest education level: Not on file  Occupational History  . Not on file  Tobacco Use  . Smoking status: Never Smoker  . Smokeless tobacco: Never Used  Vaping Use  . Vaping Use: Never used  Substance and Sexual Activity  . Alcohol use: Yes    Comment: occ  . Drug use: No  . Sexual activity: Not Currently  Other Topics Concern  . Not on file  Social History Narrative  . Not on file   Social Determinants of Health   Financial Resource Strain: Low Risk   . Difficulty of Paying Living Expenses: Not hard at all  Food Insecurity: No Food Insecurity  . Worried About Charity fundraiser in the Last Year: Never  true  . Ran Out of Food in the Last Year: Never true  Transportation Needs: No Transportation Needs  . Lack of Transportation (Medical): No  . Lack of Transportation (Non-Medical): No  Physical Activity: Sufficiently Active  . Days of Exercise per Week: 5 days  . Minutes of Exercise per Session: 30 min  Stress: No Stress Concern Present  . Feeling of Stress : Not at all  Social Connections: Socially Isolated  . Frequency of Communication with Friends and Family: More than three times  a week  . Frequency of Social Gatherings with Friends and Family: Never  . Attends Religious Services: Never  . Active Member of Clubs or Organizations: No  . Attends Archivist Meetings: Never  . Marital Status: Widowed  Intimate Partner Violence: Not on file  :  Pertinent items are noted in HPI.  Exam:  General appearance: alert and cooperative appeared without distress. Head: atraumatic without any abnormalities. Eyes: conjunctivae/corneas clear. PERRL.  Sclera anicteric. Throat: lips, mucosa, and tongue normal; without oral thrush or ulcers. Resp: clear to auscultation bilaterally without rhonchi, wheezes or dullness to percussion. Cardio: regular rate and rhythm, S1, S2 normal, no murmur, click, rub or gallop GI: soft, non-tender; bowel sounds normal; no masses,  no organomegaly Skin: Skin color, texture, turgor normal. No rashes or lesions Lymph nodes: Cervical, supraclavicular, and axillary nodes normal. Neurologic: Grossly normal without any motor, sensory or deep tendon reflexes. Musculoskeletal: No joint deformity or effusion.    DG Chest 2 View  Result Date: 09/03/2020 CLINICAL DATA:  10 pound weight loss EXAM: CHEST - 2 VIEW COMPARISON:  None. FINDINGS: There is normal heart size. No pleural effusion or interstitial edema. Nodular density within the left upper lobe measures 2.0 cm. In the right upper lobe there is an indeterminate nodular density measuring 1.4 cm.-no airspace  consolidation. Diffuse chronic interstitial coarsening noted bilaterally 6 consistent with chronic bronchitic change. Degenerative changes are noted within the thoracic spine and both glenohumeral joints. IMPRESSION: Indeterminate nodular densities in both upper lobes. Recommend further evaluation with CT of the chest. Electronically Signed   By: Kerby Moors M.D.   On: 09/03/2020 15:02   CT Chest Wo Contrast  Result Date: 09/19/2020 CLINICAL DATA:  87 year old female with lung nodule. EXAM: CT CHEST WITHOUT CONTRAST TECHNIQUE: Multidetector CT imaging of the chest was performed following the standard protocol without IV contrast. COMPARISON:  Chest radiograph dated 09/02/2020. FINDINGS: Evaluation of this exam is limited in the absence of intravenous contrast. Cardiovascular: There is no cardiomegaly or pericardial effusion. Advanced 3 vessel coronary vascular calcification. Moderate atherosclerotic calcification of the thoracic aorta. No aneurysmal dilatation. The central pulmonary arteries are grossly unremarkable on this noncontrast CT. Mediastinum/Nodes: There is no hilar or mediastinal adenopathy. The esophagus and the thyroid gland are grossly unremarkable. No mediastinal fluid collection. Lungs/Pleura: Multiple (less than 20) scattered pulmonary nodules measure up to 9 mm in the left lower lobe most consistent with metastatic disease. Clinical correlation is recommended. No focal consolidation, pleural effusion, or pneumothorax. The central airways are patent. Upper Abdomen: Extensive hypodense liver masses with a large confluent mass in the right lobe of the liver measuring up to 11 x 8 cm consistent with metastatic disease. Correlation with history of known primary recommended. Several left renal cysts including a septated and lobulated 5.5 cm left renal upper pole cyst. Musculoskeletal: Osteopenia with degenerative changes of the spine. No acute osseous pathology. IMPRESSION: 1. Multiple bilateral  pulmonary nodules most consistent with metastatic disease. 2. Extensive hepatic metastatic disease. Correlation with history of known primary and multidisciplinary consult recommended. 3. Aortic Atherosclerosis (ICD10-I70.0). Electronically Signed   By: Anner Crete M.D.   On: 09/19/2020 18:31    Assessment and Plan:   87 year old woman with:  1.  Advanced malignancy with bilateral lung nodules as well as extensive hepatic involvement based on imaging studies obtained on Sep 18, 2020.  This is completed after presenting with symptoms of weakness, fatigue as well as weight loss.  She does have history  of bladder cancer with predominantly nonmuscle invasive disease with the last TURBT in April 2022.  The differential diagnosis and management options were reviewed at this time.  The aggressive approach at this time would include a obtaining tissue biopsy from her hepatic lesions and consider systemic treatment accordingly.  The likelihood that we are dealing with advanced prostate cancer although other considerations could not be ruled out.  Other malignancies such as lung cancer could also be a consideration and it is essential to obtain tissue biopsy for her metastatic disease. therapy   Once that is obtained and bladder cancer is confirmed.  Treatment options including systemic chemotherapy vs immunotherapy will be evaluated and discussed.  Systemic chemotherapy including gemcitabine and carboplatin with complications include nausea, vomiting, myelosuppression, neutropenia possible sepsis.  Immunotherapy.  He had the risk of autoimmune complications GI as well as dermatological toxicity.  After discussion today, she opted against any intervention at this time.  She declined biopsy or anticancer treatment.  2.  Bladder cancer: For the most part has been superficial with nonmuscle invasive disease.  Her last TURBT is showing more aggressive variants which could be responsible for her metastatic  disease.  No local therapy is indicated at this time.  3.  Baseline kidney function: Creatinine clearance on Sep 02, 2020 normal findings.  She would be a reasonable candidate for platinum therapy based on.  4.  Prognosis and goals of care: Her disease is incurable at this time and any treatment would be palliative at best.  Her prognosis is poor with limited life expectancy given the extensive nature.  We have discussed the role of hospice in the future if she develops decline in her performance status and health.  5.  Follow-up: Happy to assist in her care in the future as needed.  60  minutes were dedicated to this visit. The time was spent on reviewing laboratory data, imaging studies, discussing treatment options, discussing differential diagnosis and answering questions regarding prognosis and future plan of care.    A copy of this consult has been forwarded to the requesting physician.

## 2020-12-19 ENCOUNTER — Other Ambulatory Visit: Payer: Self-pay | Admitting: Oncology

## 2020-12-19 NOTE — Progress Notes (Signed)
I have reached out to the patient's daughter to answer her question regarding Ms. Evon's condition.  She has expressed concerns about her overall decline in her performance status, unsteadiness in gait and overall dizziness.  I explained to her that it is unclear the etiology of the symptoms and could be related to cancer progression on multiple other causes.  She is clearly declining and would benefit from hospice involvement.  I feel that the best way to comprehensively address her symptoms is to have hospice involved.  Her daughter expressed concerned that Ms. Fewell has been reluctant to having hospice involved.  I explained to her that I feel that this in her best interest at this time.   All her questions were answered to her satisfaction.  I feel that these are signs of overall functional decline and poor life expectancy related to malignancy.

## 2020-12-26 ENCOUNTER — Telehealth: Payer: Self-pay | Admitting: *Deleted

## 2020-12-26 NOTE — Telephone Encounter (Signed)
Returned PC to patient's daughter, Lynelle Smoke.  She states her mother has been having severe abdominal since last week & is requesting a prescription for pain meds.  She is also requesting a hospice referral.  Dr. Alen Blew informed, patient's daughter verbalizes understanding.

## 2020-12-27 ENCOUNTER — Telehealth: Payer: Self-pay

## 2020-12-27 ENCOUNTER — Other Ambulatory Visit: Payer: Self-pay

## 2020-12-27 DIAGNOSIS — C670 Malignant neoplasm of trigone of bladder: Secondary | ICD-10-CM

## 2020-12-27 NOTE — Telephone Encounter (Signed)
I spoke with Rhonda Norman to make her aware that we have started the referral to hospice.

## 2020-12-27 NOTE — Telephone Encounter (Signed)
-----   Message from Wyatt Portela, MD sent at 12/27/2020  7:05 AM EDT ----- Ok to do hospice referral. I can't give any pain medication for her. Hospice will manage her pain. Thanks ----- Message ----- From: Rolene Course, RN Sent: 12/26/2020   4:46 PM EDT To: Wyatt Portela, MD, Burna Mortimer, CMA  This patient's daughter, Lynelle Smoke, called this afternoon. Your office note said the patient had decided against any interventions.  Tammy says her mother has been having severe abdominal since last week & is requesting a prescription for pain meds.  She is also requesting a hospice referral. Please advise, thanks.

## 2020-12-27 NOTE — Progress Notes (Signed)
Hospice referral

## 2021-01-03 ENCOUNTER — Telehealth: Payer: Self-pay | Admitting: Internal Medicine

## 2021-01-03 NOTE — Telephone Encounter (Signed)
Ok with me 

## 2021-01-03 NOTE — Telephone Encounter (Signed)
Gregary Signs from [place] called  Requesting to have Dr. Jenny Reichmann as an attending physician.   Best callback #: [phone]

## 2021-01-03 NOTE — Telephone Encounter (Signed)
Unable to leave message for Rhonda Norman at [facility] [employer]

## 2021-01-06 NOTE — Telephone Encounter (Signed)
Unable to leave message

## 2021-01-07 NOTE — Telephone Encounter (Signed)
Confirmed with Gregary Signs that Dr. Jenny Reichmann will be the attending  Her correct number is [phone]

## 2021-01-14 ENCOUNTER — Telehealth: Payer: Self-pay | Admitting: Internal Medicine

## 2021-01-14 MED ORDER — TRAMADOL HCL 50 MG PO TABS: 50.0000 mg | ORAL_TABLET | Freq: Four times a day (QID) | ORAL | 0 refills | Status: AC | PRN

## 2021-01-14 NOTE — Telephone Encounter (Signed)
Almyra Free from [facility] has called to inform us that the pt has fell on her bottom recently. Says the pain is an 8/10. Wondering if Dr.John could call in something for the pain.  Please advise.   Almyra [phone]

## 2021-01-14 NOTE — Telephone Encounter (Signed)
[place] for tramadol prn   Please consider OV or see sport medicine to r/o tailbone fx if pain persists

## 2021-01-16 ENCOUNTER — Telehealth: Payer: Self-pay | Admitting: Internal Medicine

## 2021-01-16 NOTE — Telephone Encounter (Signed)
[place] [facility] [place] calling about a fax they received, it was missing a page about Dr. Jenny Reichmann signing in reference to comfort care  Please fax it to [phone]

## 2021-01-17 NOTE — Telephone Encounter (Signed)
Notified Almyra Free from [facility]

## 2021-01-20 NOTE — Telephone Encounter (Signed)
Form has been refaxed.

## 2021-01-20 NOTE — Telephone Encounter (Signed)
Megan called [facility] called   They still did not receive the complete fax. Requesting it to be re-faxed again.

## 2021-01-20 NOTE — Telephone Encounter (Signed)
Form re-faxed

## 2021-01-31 ENCOUNTER — Telehealth: Payer: Self-pay

## 2021-01-31 NOTE — Telephone Encounter (Signed)
PA started for traMADol  (Key: [id number])

## 2021-02-03 ENCOUNTER — Telehealth: Payer: Self-pay

## 2021-02-03 NOTE — Telephone Encounter (Signed)
Just to be clear - what does she suggest -   I think the options would be home, independent living, assisted living or nursing home

## 2021-02-03 NOTE — Telephone Encounter (Signed)
Once note is complete please contact tammy at [phone] and email note to   [email or web address]

## 2021-02-03 NOTE — Telephone Encounter (Signed)
Patient daughter (tammy) is requesting a note for work to express the level of care need for mother until FMLA can be done.

## 2021-02-05 NOTE — Telephone Encounter (Signed)
Patient's daughter states that she would like to be the one out of work caring for her mom and needs a letter to submit for FMLA with her job saying that she is caregiver for mom x3 months. Patient is currently living with daughter.

## 2021-02-05 NOTE — Telephone Encounter (Signed)
I dont understand the need for the letter.  Please submit the FMLA for filling out for  this purpose.  thanks

## 2021-02-06 ENCOUNTER — Telehealth: Payer: Self-pay | Admitting: Internal Medicine

## 2021-02-06 NOTE — Telephone Encounter (Signed)
Left message for Almyra Free to call me back

## 2021-02-06 NOTE — Telephone Encounter (Signed)
Verbals given  

## 2021-02-06 NOTE — Telephone Encounter (Signed)
Ok for verbals 

## 2021-02-06 NOTE — Telephone Encounter (Signed)
Rhonda Norman from [place] called   Pt is complaining of increased SOB with exertion. Requesting verbals for a oxygen tank.   Best contact #: [phone]

## 2021-03-28 NOTE — Telephone Encounter (Signed)
Very sorry - I have not seen the patient since may 2022, and am completely unable to fill out a 4 page form with numerous questions I do not know the answers  Needs ROV

## 2021-03-28 NOTE — Telephone Encounter (Signed)
The following message was explained in detail to patient's daughter Sylvester Harder. Though upset, patient's daughter verbalizes understanding

## 2021-03-28 NOTE — Telephone Encounter (Signed)
Patient daughter Lynelle Smoke calling in  Says shes been out of work on [employer] since 02/11/21 but she spoke to someone at [employer] who is suppose to be handling her FMLA & they advised her that it was denied & needs to be resubmitted  Says it needs to be started by Dr. Jenny Reichmann & then forwarded to patient Oncologist Dr. Alen Blew for completion  Please call Lovena Le @ [phone]

## 2021-03-28 NOTE — Telephone Encounter (Signed)
Spoke with patient's daughter and she would like FMLA papers filled out and faxed today if possible. Paperwork faxed over from [employer] and printed to be filled out.

## 2021-03-31 ENCOUNTER — Telehealth: Payer: Self-pay | Admitting: *Deleted

## 2021-03-31 NOTE — Telephone Encounter (Signed)
"  Briany Aye Lauture daughter Sylvester Harder [phone]) calling because I need FMLA form completed.  Dr. Jenny Reichmann refused to complete FMLA form.  Says he needs to see her because he has not seen her in over six months.  I brought form in hopes Dr. Alen Blew will complete form as he diagnosed her.  She is eight-six years old, ready to go, declined chemotherapy.  I just need someone to complete my FMLA as I am here alone as her sole caregiver.  My spouse works out of town."    Tammy aware MD returns to office tomorrow.  This nurse will ask and return call tomorrow morning.  Mom seen 09/25/2020 as new patient with as needed follow up.        "Mom has lived with me since Sep 13, 2020 when she told me she has stage IV cancer and lonely.  She has not left my house beyond back porch.  I have not worked since 02/11/2021.    [facility] arranged oxygen, wheelchair, walker, hospital bed, CNA three times, nurse twice weekly.      Currently unable to get out of bed since Thanksgiving. Newly totally disoriented; calling me mama asking where she is.        Unable to get out or perform any activities of daily living.  I feed her, clean and change her."

## 2021-04-03 NOTE — Telephone Encounter (Signed)
Ulice Brilliant Ambroise daughter Tammy notified of successful transfer notice of FMLA form faxed today.  Confirmed address to mail originals for her records as [address] [place] [address] Declined offer to e-mail.

## 2021-04-22 ENCOUNTER — Telehealth: Payer: Self-pay | Admitting: Internal Medicine

## 2021-04-22 NOTE — Telephone Encounter (Signed)
LVM for pt to rtn my call to schedule AWV with [facility]. Please schedule this appt id pt calls the office.

## 2021-04-24 ENCOUNTER — Telehealth: Payer: Self-pay | Admitting: Internal Medicine

## 2021-04-27 NOTE — Telephone Encounter (Signed)
Ok this is noted 

## 2021-04-27 NOTE — Telephone Encounter (Signed)
Natasha Bence is a nurse with [facility] called in stating the patient has passed away and that the death certificate will be sent to Dr. Jenny Reichmann.

## 2021-04-27 DEATH — deceased

## 2021-09-11 ENCOUNTER — Telehealth: Payer: Self-pay | Admitting: Internal Medicine

## 2021-09-11 NOTE — Telephone Encounter (Signed)
Left message for patient to call back to schedule Medicare Annual Wellness Visit   Last AWV  03/19/20  Please schedule at anytime with [facility] Health Advisor if patient calls the office back.      Any questions, please call me at [phone]  

## 2022-09-27 IMAGING — CT CT CHEST W/O CM
2 of 4 series · 11 of 36 positions shown, 13 images · non-contrast
Comparison: Chest radiograph dated 09/02/2020.

CLINICAL DATA: 88-year-old female with lung nodule.

EXAM:
CT CHEST WITHOUT CONTRAST
TECHNIQUE: Multidetector CT imaging of the chest was performed following the
standard protocol without IV contrast.

[Series 2: chest 2.00 br40 s3 · axial · 0.46mm/px · z∈[+1605,+1893]mm · 8 of 172 slices shown, 10 images (1 of 2)]
[im 14/172  mediastinal]
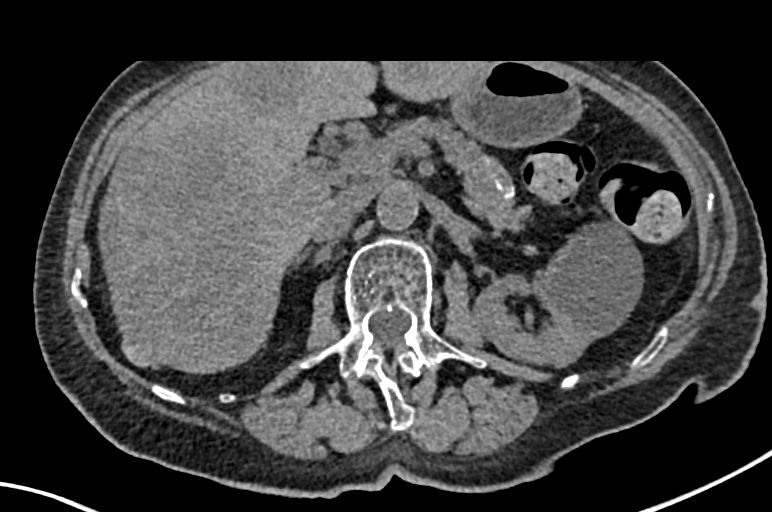
[im 14/172  lung]
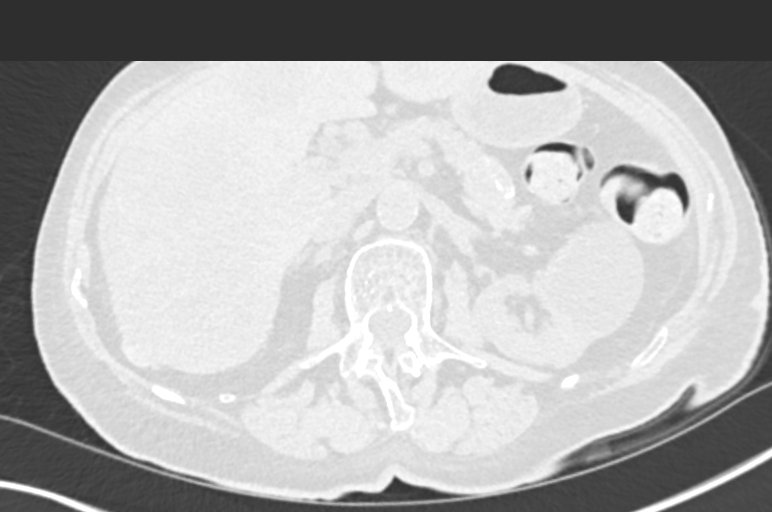
[im 40/172  lung]
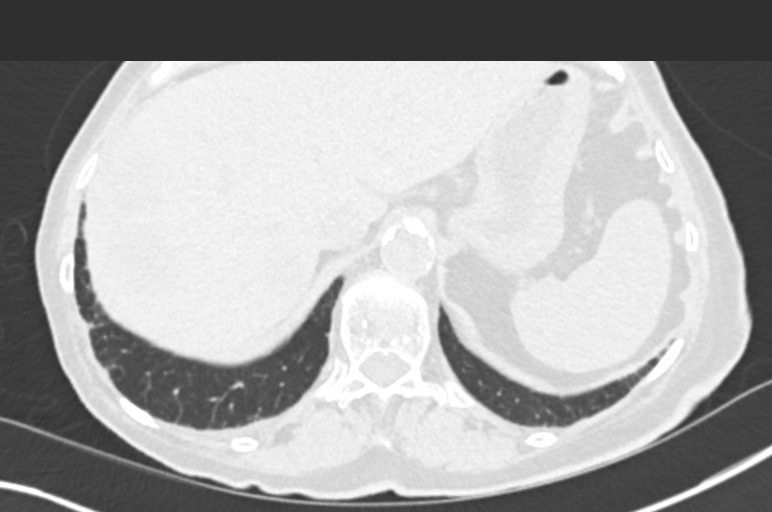
[im 53/172  lung]
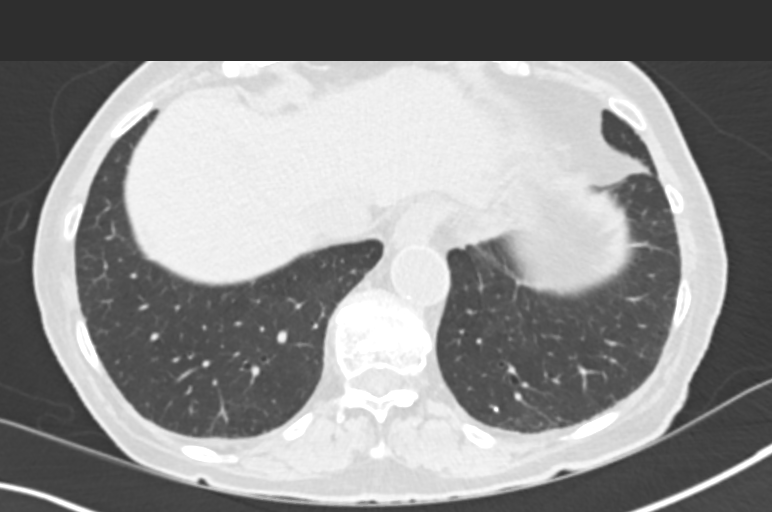
[im 79/172  lung]
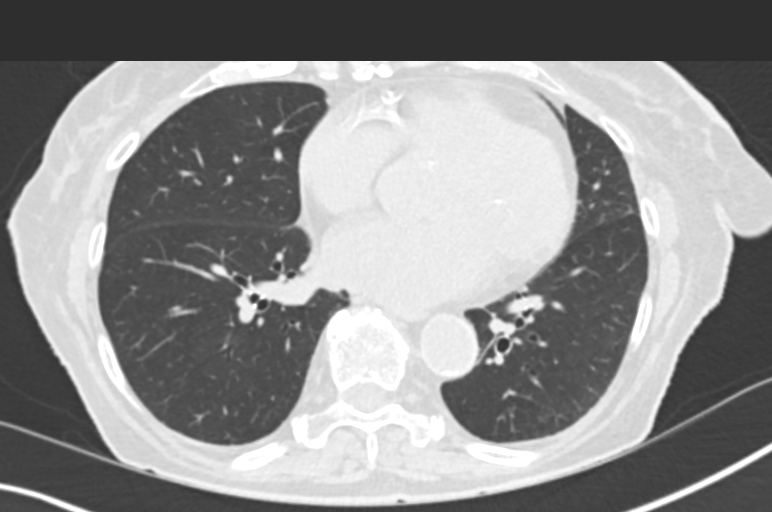
[im 93/172  mediastinal]
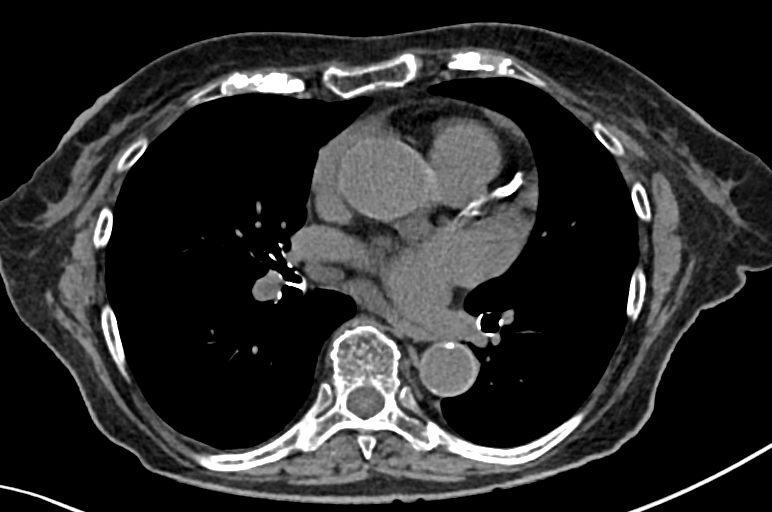
[im 93/172  lung]
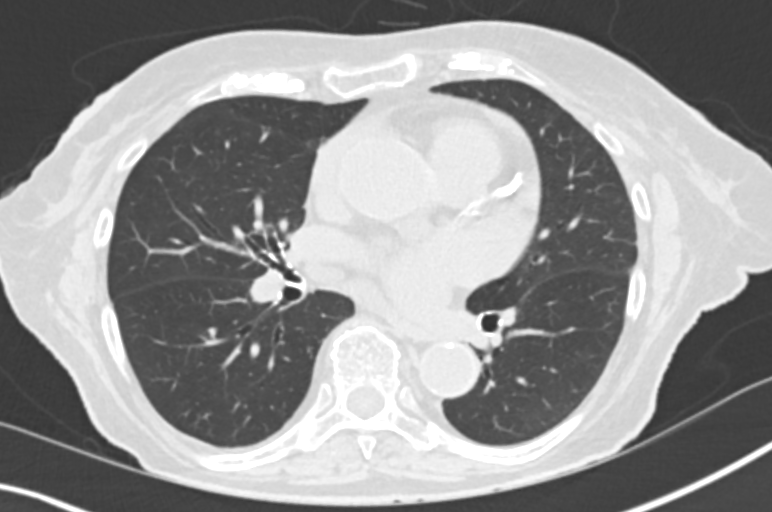
[im 119/172  lung]
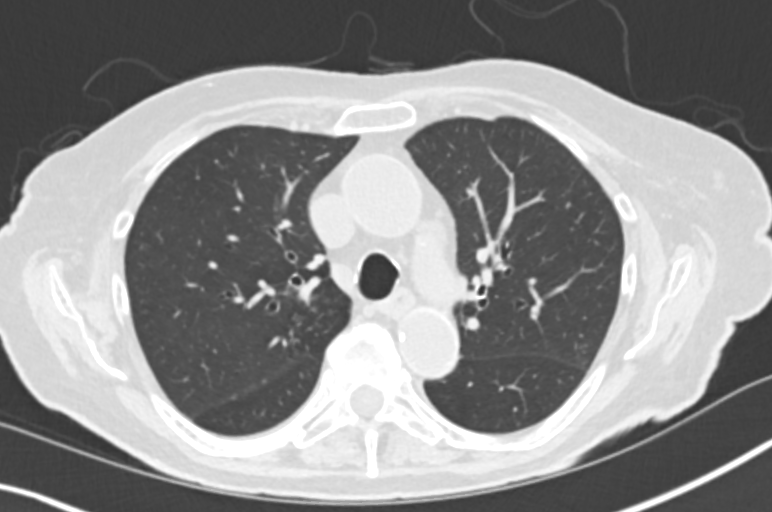
[im 132/172  lung]
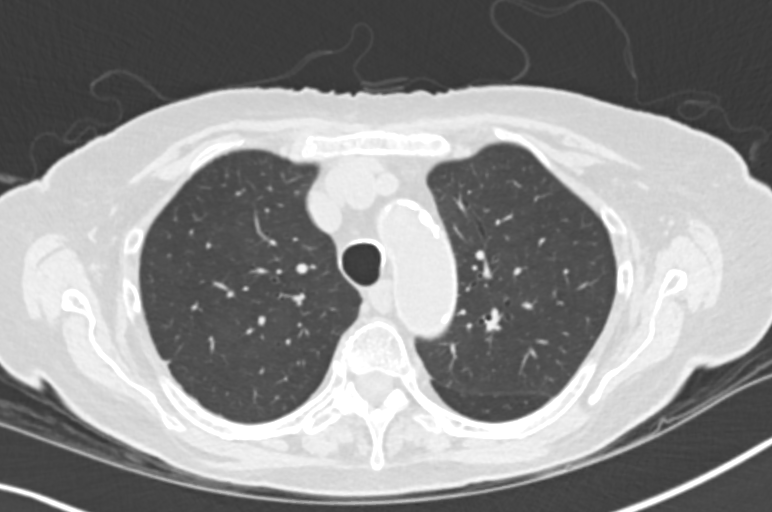
[im 158/172  lung]
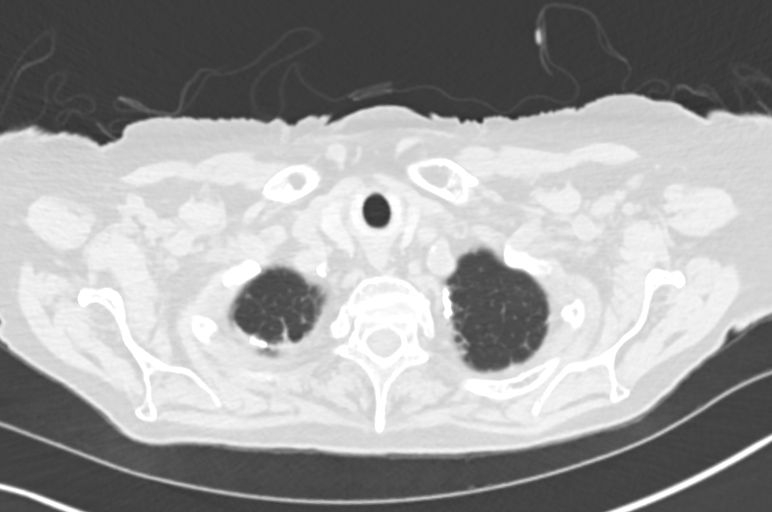

[Series 4: chest 2.00 br40 s3 · coronal · 0.67mm/px · 3 of 118 slices shown (2 of 2)]
[im 24/118  lung]
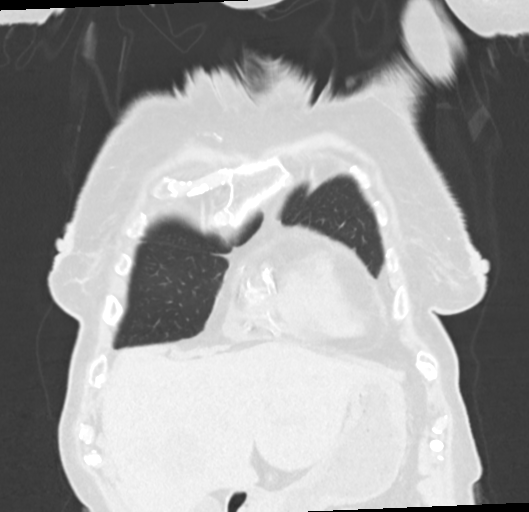
[im 47/118  lung]
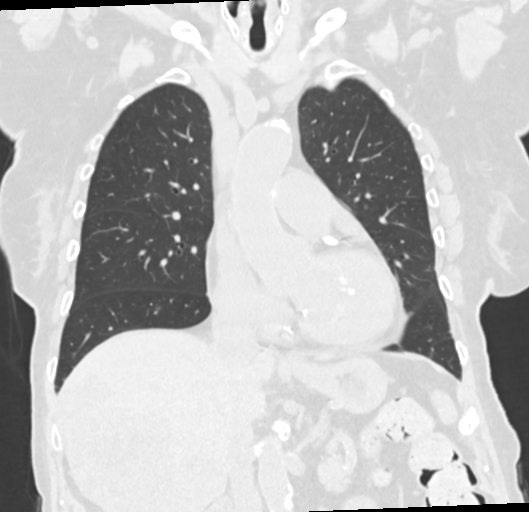
[im 71/118  lung]
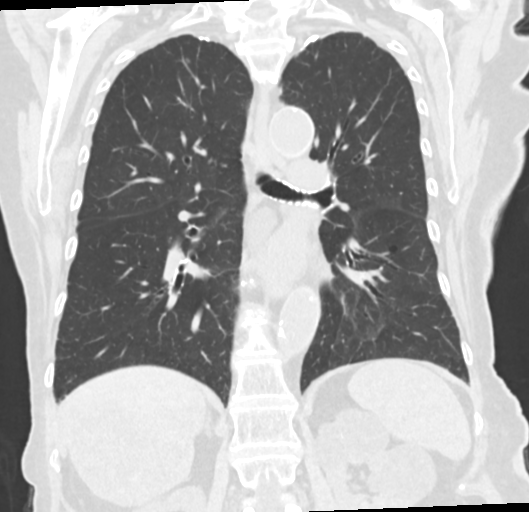

[11 of 36 positions shown; findings below may reference images not displayed]

FINDINGS: Evaluation of this exam is limited in the absence of intravenous
contrast.

Cardiovascular: There is no cardiomegaly or pericardial effusion.
Advanced 3 vessel coronary vascular calcification. Moderate
atherosclerotic calcification of the thoracic aorta. No aneurysmal
dilatation. The central pulmonary arteries are grossly unremarkable
on this noncontrast CT.

Mediastinum/Nodes: There is no hilar or mediastinal adenopathy. The
esophagus and the thyroid gland are grossly unremarkable. No
mediastinal fluid collection.

Lungs/Pleura: Multiple (less than 20) scattered pulmonary nodules
measure up to 9 mm in the left lower lobe most consistent with
metastatic disease. Clinical correlation is recommended. No focal
consolidation, pleural effusion, or pneumothorax. The central
airways are patent.

Upper Abdomen: Extensive hypodense liver masses with a large
confluent mass in the right lobe of the liver measuring up to 11 x 8
cm consistent with metastatic disease. Correlation with history of
known primary recommended. Several left renal cysts including a
septated and lobulated 5.5 cm left renal upper pole cyst.

Musculoskeletal: Osteopenia with degenerative changes of the spine.
No acute osseous pathology.
IMPRESSION: 1. Multiple bilateral pulmonary nodules most consistent with
metastatic disease.
2. Extensive hepatic metastatic disease. Correlation with history of
known primary and multidisciplinary consult recommended.
3. Aortic Atherosclerosis ([id number]).
